# Patient Record
Sex: Male | Born: 1935 | Race: White | Hispanic: No | Smoking: Former smoker
Health system: Southern US, Community
[De-identification: ages and names within clinical notes are randomized; demographics above are authoritative.]

## PROBLEM LIST (undated history)

## (undated) DIAGNOSIS — N281 Cyst of kidney, acquired: Secondary | ICD-10-CM

## (undated) DIAGNOSIS — K449 Diaphragmatic hernia without obstruction or gangrene: Secondary | ICD-10-CM

## (undated) DIAGNOSIS — F419 Anxiety disorder, unspecified: Secondary | ICD-10-CM

## (undated) DIAGNOSIS — L570 Actinic keratosis: Secondary | ICD-10-CM

## (undated) DIAGNOSIS — N189 Chronic kidney disease, unspecified: Secondary | ICD-10-CM

## (undated) DIAGNOSIS — Z7901 Long term (current) use of anticoagulants: Secondary | ICD-10-CM

## (undated) DIAGNOSIS — N400 Enlarged prostate without lower urinary tract symptoms: Secondary | ICD-10-CM

## (undated) DIAGNOSIS — K648 Other hemorrhoids: Secondary | ICD-10-CM

## (undated) DIAGNOSIS — E538 Deficiency of other specified B group vitamins: Secondary | ICD-10-CM

## (undated) DIAGNOSIS — I441 Atrioventricular block, second degree: Secondary | ICD-10-CM

## (undated) DIAGNOSIS — J449 Chronic obstructive pulmonary disease, unspecified: Secondary | ICD-10-CM

## (undated) DIAGNOSIS — R001 Bradycardia, unspecified: Secondary | ICD-10-CM

## (undated) DIAGNOSIS — K219 Gastro-esophageal reflux disease without esophagitis: Secondary | ICD-10-CM

## (undated) DIAGNOSIS — I700 Atherosclerosis of aorta: Secondary | ICD-10-CM

## (undated) DIAGNOSIS — I2510 Atherosclerotic heart disease of native coronary artery without angina pectoris: Secondary | ICD-10-CM

## (undated) DIAGNOSIS — R350 Frequency of micturition: Secondary | ICD-10-CM

## (undated) DIAGNOSIS — Z87442 Personal history of urinary calculi: Secondary | ICD-10-CM

## (undated) DIAGNOSIS — I10 Essential (primary) hypertension: Secondary | ICD-10-CM

## (undated) DIAGNOSIS — M8580 Other specified disorders of bone density and structure, unspecified site: Secondary | ICD-10-CM

## (undated) DIAGNOSIS — M1990 Unspecified osteoarthritis, unspecified site: Secondary | ICD-10-CM

## (undated) DIAGNOSIS — G4733 Obstructive sleep apnea (adult) (pediatric): Secondary | ICD-10-CM

## (undated) DIAGNOSIS — E785 Hyperlipidemia, unspecified: Secondary | ICD-10-CM

## (undated) DIAGNOSIS — N200 Calculus of kidney: Secondary | ICD-10-CM

## (undated) DIAGNOSIS — C801 Malignant (primary) neoplasm, unspecified: Secondary | ICD-10-CM

## (undated) HISTORY — DX: Essential (primary) hypertension: I10

## (undated) HISTORY — PX: TOTAL SHOULDER REPLACEMENT: SUR1217

## (undated) HISTORY — PX: CARDIAC CATHETERIZATION: SHX172

## (undated) HISTORY — PX: SKIN CANCER EXCISION: SHX779

## (undated) HISTORY — DX: Actinic keratosis: L57.0

## (undated) HISTORY — PX: CORONARY ARTERY BYPASS GRAFT: SHX141

## (undated) HISTORY — DX: Calculus of kidney: N20.0

## (undated) HISTORY — DX: Unspecified osteoarthritis, unspecified site: M19.90

## (undated) HISTORY — PX: FRACTURE SURGERY: SHX138

## (undated) HISTORY — PX: PILONIDAL CYST EXCISION: SHX744

## (undated) HISTORY — PX: COLONOSCOPY: SHX174

---

## 1999-09-22 DIAGNOSIS — Z951 Presence of aortocoronary bypass graft: Secondary | ICD-10-CM

## 1999-09-22 HISTORY — DX: Presence of aortocoronary bypass graft: Z95.1

## 2007-11-11 DIAGNOSIS — Z716 Tobacco abuse counseling: Secondary | ICD-10-CM | POA: Insufficient documentation

## 2010-03-08 DIAGNOSIS — R1013 Epigastric pain: Secondary | ICD-10-CM | POA: Insufficient documentation

## 2011-01-22 DIAGNOSIS — Z951 Presence of aortocoronary bypass graft: Secondary | ICD-10-CM | POA: Insufficient documentation

## 2011-08-03 DIAGNOSIS — S42209A Unspecified fracture of upper end of unspecified humerus, initial encounter for closed fracture: Secondary | ICD-10-CM | POA: Insufficient documentation

## 2012-06-15 DIAGNOSIS — J449 Chronic obstructive pulmonary disease, unspecified: Secondary | ICD-10-CM | POA: Insufficient documentation

## 2013-05-23 DIAGNOSIS — Z872 Personal history of diseases of the skin and subcutaneous tissue: Secondary | ICD-10-CM | POA: Insufficient documentation

## 2014-02-22 DIAGNOSIS — L814 Other melanin hyperpigmentation: Secondary | ICD-10-CM | POA: Insufficient documentation

## 2014-02-22 DIAGNOSIS — L570 Actinic keratosis: Secondary | ICD-10-CM | POA: Insufficient documentation

## 2014-02-22 DIAGNOSIS — D1801 Hemangioma of skin and subcutaneous tissue: Secondary | ICD-10-CM | POA: Insufficient documentation

## 2014-06-28 DIAGNOSIS — H02135 Senile ectropion of left lower eyelid: Secondary | ICD-10-CM | POA: Insufficient documentation

## 2015-11-01 DIAGNOSIS — K648 Other hemorrhoids: Secondary | ICD-10-CM | POA: Insufficient documentation

## 2015-11-01 DIAGNOSIS — K5730 Diverticulosis of large intestine without perforation or abscess without bleeding: Secondary | ICD-10-CM | POA: Insufficient documentation

## 2015-12-16 DIAGNOSIS — E663 Overweight: Secondary | ICD-10-CM | POA: Insufficient documentation

## 2016-01-01 DIAGNOSIS — G4733 Obstructive sleep apnea (adult) (pediatric): Secondary | ICD-10-CM | POA: Insufficient documentation

## 2016-12-08 DIAGNOSIS — Z808 Family history of malignant neoplasm of other organs or systems: Secondary | ICD-10-CM | POA: Insufficient documentation

## 2016-12-28 DIAGNOSIS — E559 Vitamin D deficiency, unspecified: Secondary | ICD-10-CM | POA: Insufficient documentation

## 2016-12-30 DIAGNOSIS — E538 Deficiency of other specified B group vitamins: Secondary | ICD-10-CM | POA: Insufficient documentation

## 2017-05-20 ENCOUNTER — Encounter: Payer: Self-pay | Admitting: Urology

## 2017-05-20 ENCOUNTER — Ambulatory Visit (INDEPENDENT_AMBULATORY_CARE_PROVIDER_SITE_OTHER): Payer: Medicare HMO | Admitting: Urology

## 2017-05-20 VITALS — BP 153/97 | HR 76 | Ht 70.0 in | Wt 194.6 lb

## 2017-05-20 DIAGNOSIS — N400 Enlarged prostate without lower urinary tract symptoms: Secondary | ICD-10-CM | POA: Diagnosis not present

## 2017-05-20 DIAGNOSIS — Z125 Encounter for screening for malignant neoplasm of prostate: Secondary | ICD-10-CM

## 2017-05-20 NOTE — Addendum Note (Signed)
Addended by: Kyra Manges on: 05/20/2017 04:41 PM   Modules accepted: Orders

## 2017-05-20 NOTE — Progress Notes (Signed)
05/20/2017 1:40 PM   James [name] [birth year] [id number]  Referring provider: McLean-Scocuzza, Nino Glow, [place], [place] [address]  Chief Complaint  Patient presents with  . Elevated PSA    HPI: The patient is a 81 year old gentleman with a past metal history of BPH on doxazosin and finasteride along with multiple medical comorbidities presents today to discuss his PSA. His PSA was 3.0 in August 2018.  It was 1.3 in February 2017. In regards to his urination, he was just started on doxazosin and finasteride within the last few weeks. He is noted improvement in his urinary symptoms since starting these medications. He still complains of nocturia 3 which is improved from nocturia every hour. He feels like he has normal frequency during the day. He feels he has a good stream and empties his bladder. His main concern is his nocturia currently. Overall he is not very bothered by these symptoms. He is more concerned to ensure that he does not have prostate cancer.   PMH: Past Medical History:  Diagnosis Date  . Arthritis   . Hypertension     Surgical History: Past Surgical History:  Procedure Laterality Date  . SKIN CANCER EXCISION    . TOTAL SHOULDER REPLACEMENT Left     Home Medications:  Allergies as of 05/20/2017   No Known Allergies     Medication List       Accurate as of 05/20/17  1:40 PM. Always use your most recent med list.          atorvastatin 10 MG tablet Commonly known as:  LIPITOR every evening.   carvedilol 3.125 MG tablet Commonly known as:  COREG Take 3.125 mg by mouth 2 (two) times daily.   doxazosin 1 MG tablet Commonly known as:  CARDURA TAKE 1 TABLET BY MOUTH EVERY NIGHT AT BEDTIME FOR PROSTATE SYMPTOMS   finasteride 5 MG tablet Commonly known as:  PROSCAR Take 5 mg by mouth daily.   lisinopril 20 MG tablet Commonly known as:  PRINIVIL,ZESTRIL TAKE 1 TABLET BY MOUTH EVERY DAY IN THE MORNING   Vitamin D  (Ergocalciferol) 50000 units Caps capsule Commonly known as:  [place] DAY            Discharge Care Instructions        Start     Ordered   05/20/17 0000  Urinalysis, Complete     05/20/17 1311      Allergies: No Known Allergies  Family History: Family History  Problem Relation Age of Onset  . Prostate cancer Neg Hx   . Bladder Cancer Neg Hx   . Kidney cancer Neg Hx     Social History:  reports that he has quit smoking. He has never used smokeless tobacco. He reports that he drinks alcohol. He reports that he does not use drugs.  ROS: UROLOGY Frequent Urination?: Yes Hard to postpone urination?: Yes Burning/pain with urination?: No Get up at night to urinate?: Yes Leakage of urine?: No Urine stream starts and stops?: Yes Trouble starting stream?: No Do you have to strain to urinate?: No Blood in urine?: No Urinary tract infection?: No Sexually transmitted disease?: No Injury to kidneys or bladder?: No Painful intercourse?: No Weak stream?: Yes Erection problems?: No Penile pain?: No  Gastrointestinal Nausea?: No Vomiting?: No Indigestion/heartburn?: No Diarrhea?: No Constipation?: No  Constitutional Fever: No Night sweats?: No Weight loss?: No Fatigue?: No  Skin Skin rash/lesions?: No Itching?: No  Eyes Blurred  vision?: No Double vision?: No  Ears/Nose/Throat Sore throat?: No Sinus problems?: No  Hematologic/Lymphatic Swollen glands?: No Easy bruising?: Yes  Cardiovascular Leg swelling?: No Chest pain?: No  Respiratory Cough?: No Shortness of breath?: No  Endocrine Excessive thirst?: No  Musculoskeletal Back pain?: No Joint pain?: No  Neurological Headaches?: No Dizziness?: No  Psychologic Depression?: No Anxiety?: Yes  Physical Exam: BP (!) 153/97 (BP Location: Left Arm, Patient Position: Sitting, Cuff Size: Normal)   Pulse 76   Ht 5\' 10"  (1.778 m)   Wt 194 lb 9.6 oz (88.3 kg)   BMI 27.92  kg/m   Constitutional:  Alert and oriented, No acute distress. HEENT: [place] AT, moist mucus membranes.  Trachea midline, no masses. Cardiovascular: No clubbing, cyanosis, or edema. Respiratory: Normal respiratory effort, no increased work of breathing. GI: Abdomen is soft, nontender, nondistended, no abdominal masses GU: No CVA tenderness. Normal phallus. Testicles and equal bilaterally. 9. DRE: 2+ smooth benign. Skin: No rashes, bruises or suspicious lesions. Lymph: No cervical or inguinal adenopathy. Neurologic: Grossly intact, no focal deficits, moving all 4 extremities. Psychiatric: Normal mood and affect.  Laboratory Data: No results found for: WBC, HGB, HCT, MCV, PLT  No results found for: CREATININE  No results found for: PSA  No results found for: TESTOSTERONE  No results found for: HGBA1C  Urinalysis No results found for: COLORURINE, APPEARANCEUR, LABSPEC, PHURINE, GLUCOSEU, HGBUR, BILIRUBINUR, KETONESUR, PROTEINUR, UROBILINOGEN, NITRITE, LEUKOCYTESUR  Assessment & Plan:    1. Prostate cancer screening I discussed the patient that his PSA is completely appropriate for his age. We also discussed the American Urological Association guidelines would strongly discouraged prostate cancer screening via PSA in men in their late 70s. As such since he has age-appropriate PSA, no further PSA screening in this patient is indicated.  2. BPH Continue doxazosin and finasteride. I did discuss the patient that his finasteride will take up to 6 months to have maximal effect at least 6 weeks before any effect is even noted. Since he just underwent this medication, I have advised him to continue for 6 months along with his doxazosin. He is still having bothersome urinary symptoms at that time he'll contact the office for reevaluation.   Return if symptoms worsen or fail to improve.  Nickie Retort, MD  [facility] [address] [place],   [address] [phone]

## 2017-06-22 DIAGNOSIS — I25810 Atherosclerosis of coronary artery bypass graft(s) without angina pectoris: Secondary | ICD-10-CM | POA: Insufficient documentation

## 2018-06-13 DIAGNOSIS — K449 Diaphragmatic hernia without obstruction or gangrene: Secondary | ICD-10-CM | POA: Insufficient documentation

## 2018-06-24 ENCOUNTER — Encounter: Payer: Self-pay | Admitting: Urology

## 2018-06-24 ENCOUNTER — Ambulatory Visit: Payer: Medicare HMO | Admitting: Urology

## 2018-06-24 VITALS — BP 170/101 | HR 69 | Ht 70.0 in | Wt 204.9 lb

## 2018-06-24 DIAGNOSIS — N200 Calculus of kidney: Secondary | ICD-10-CM

## 2018-06-24 DIAGNOSIS — N281 Cyst of kidney, acquired: Secondary | ICD-10-CM | POA: Diagnosis not present

## 2018-06-24 NOTE — Progress Notes (Signed)
   06/24/2018 4:05 PM   [name] 1936/03/20 [id number]  Reason for visit: Renal cyst, nephrolithiasis  HPI: I had the pleasure of meeting Mr. Tech in urology clinic today.  He was previously a patient of Dr. Pilar Jarvis followed for mild urinary symptoms.  He recently had a CT performed for indigestion, which showed bilateral renal cysts, as well as bilateral nonobstructing renal stones.  He denies any flank pain or gross hematuria.  He has never passed a kidney stone before.   ROS: Please see flowsheet from today's date for complete review of systems.  Physical Exam: BP (!) 170/101 (BP Location: Left Arm, Patient Position: Sitting, Cuff Size: Normal)   Pulse 69   Ht 5\' 10"  (1.778 m)   Wt 204 lb 14.4 oz (92.9 kg)   BMI 29.40 kg/m    Constitutional:  Alert and oriented, No acute distress. Respiratory: Normal respiratory effort, no increased work of breathing. GI: Abdomen is soft, nontender, nondistended, no abdominal masses GU: No CVA tenderness Skin: No rashes, bruises or suspicious lesions. Neurologic: Grossly intact, no focal deficits, moving all 4 extremities. Psychiatric: Normal mood and affect  Pertinent Imaging:  I have personally reviewed the CT abdomen pelvis dated 06/09/2018: There are bilateral renal cysts, including parapelvic cyst.  Bilateral nonobstructing renal stones.  No hydronephrosis.  Assessment & Plan:   In summary, Mr. Blackburn is an asymptomatic 82 year old male incidentally found to have bilateral renal cysts as well as bilateral nonobstructing renal stones.  He has never had a stone event previously.  We discussed options for management including surveillance, shockwave lithotripsy, and ureteroscopy.  We both agreed that as he is asymptomatic and is never passed a stone before, we will continue to monitor the stones.  No further follow-up is required for his renal cysts.  Return in about 6 months (around 12/24/2018) for kub.  Billey Co,  [facility] [address], [place], [place] [address] [phone]

## 2018-10-18 ENCOUNTER — Ambulatory Visit: Payer: Self-pay | Admitting: General Surgery

## 2018-11-11 NOTE — Pre-Procedure Instructions (Signed)
James [name]  11/11/2018      [id number] [name], [place] [address] Phone: [phone] Fax: [phone]    Your procedure is scheduled on Monday March 2nd.  Report to [facility] Admitting at 12:00PM.  Call this number if you have problems the morning of surgery:  [phone]   Remember:  Do not eat after midnight. You may drink clear liquids until 11:15 AM. Clear liquids include water, non-citrus juices without pulp,  carbonated beverages, clear tea, black coffee and gatorade.     Take these medicines the morning of surgery with A SIP OF WATER  atorvastatin (LIPITOR)  carvedilol (COREG)  citalopram (CELEXA)  finasteride (PROSCAR)  ondansetron (ZOFRAN) if needed pantoprazole (PROTONIX) if needed  Follow your surgeon's instructions on when to stop Asprin.  If no instructions were given by your surgeon then you will need to call the office to get those instructions.    7 days prior to surgery STOP taking any Aspirin(unless otherwise instructed by your surgeon), Aleve, Naproxen, Ibuprofen, Motrin, Advil, Goody's, BC's, all herbal medications, fish oil, and all vitamins    Do not wear jewelry  Do not wear lotions, powders, or colognes, or deodorant.  Do not shave 48 hours prior to surgery.  Men may shave face and neck.  Do not bring valuables to the hospital.  [facility] is not responsible for any belongings or valuables.  Contacts, dentures or bridgework may not be worn into surgery.  Leave your suitcase in the car.  After surgery it may be brought to your room.  For patients admitted to the hospital, discharge time will be determined by your treatment team.  Patients discharged the day of surgery will not be allowed to drive home.   [facility]- Preparing For Surgery  Before surgery, you can play an important role. Because skin is not sterile, your skin needs to be as free of germs as possible. You  can reduce the number of germs on your skin by washing with CHG (chlorahexidine gluconate) Soap before surgery.  CHG is an antiseptic cleaner which kills germs and bonds with the skin to continue killing germs even after washing.    Oral Hygiene is also important to reduce your risk of infection.  Remember - BRUSH YOUR TEETH THE MORNING OF SURGERY WITH YOUR REGULAR TOOTHPASTE  Please do not use if you have an allergy to CHG or antibacterial soaps. If your skin becomes reddened/irritated stop using the CHG.  Do not shave (including legs and underarms) for at least 48 hours prior to first CHG shower. It is OK to shave your face.  Please follow these instructions carefully.   1. Shower the NIGHT BEFORE SURGERY and the MORNING OF SURGERY with CHG.   2. If you chose to wash your hair, wash your hair first as usual with your normal shampoo.  3. After you shampoo, rinse your hair and body thoroughly to remove the shampoo.  4. Use CHG as you would any other liquid soap. You can apply CHG directly to the skin and wash gently with a scrungie or a clean washcloth.   5. Apply the CHG Soap to your body ONLY FROM THE NECK DOWN.  Do not use on open wounds or open sores. Avoid contact with your eyes, ears, mouth and genitals (private parts). Wash Face and genitals (private parts)  with your normal soap.  6. Wash thoroughly, paying special attention  to the area where your surgery will be performed.  7. Thoroughly rinse your body with warm water from the neck down.  8. DO NOT shower/wash with your normal soap after using and rinsing off the CHG Soap.  9. Pat yourself dry with a CLEAN TOWEL.  10. Wear CLEAN PAJAMAS to bed the night before surgery, wear comfortable clothes the morning of surgery  11. Place CLEAN SHEETS on your bed the night of your first shower and DO NOT SLEEP WITH PETS.    Day of Surgery: Shower as stated above. Do not apply any deodorants/lotions.  Please wear clean clothes to the  hospital/surgery center.   Remember to brush your teeth WITH YOUR REGULAR TOOTHPASTE.   Please read over the following fact sheets that you were given.

## 2018-11-14 ENCOUNTER — Other Ambulatory Visit: Payer: Self-pay

## 2018-11-14 ENCOUNTER — Encounter (HOSPITAL_COMMUNITY)
Admission: RE | Admit: 2018-11-14 | Discharge: 2018-11-14 | Disposition: A | Discharge status: Home / Self Care | Payer: Medicare HMO | Source: Ambulatory Visit | Attending: General Surgery | Admitting: General Surgery

## 2018-11-14 ENCOUNTER — Encounter (HOSPITAL_COMMUNITY): Payer: Self-pay

## 2018-11-14 DIAGNOSIS — Z01818 Encounter for other preprocedural examination: Secondary | ICD-10-CM | POA: Diagnosis present

## 2018-11-14 DIAGNOSIS — R001 Bradycardia, unspecified: Secondary | ICD-10-CM | POA: Insufficient documentation

## 2018-11-14 HISTORY — DX: Gastro-esophageal reflux disease without esophagitis: K21.9

## 2018-11-14 HISTORY — DX: Chronic kidney disease, unspecified: N18.9

## 2018-11-14 HISTORY — DX: Malignant (primary) neoplasm, unspecified: C80.1

## 2018-11-14 HISTORY — DX: Frequency of micturition: R35.0

## 2018-11-14 HISTORY — DX: Chronic obstructive pulmonary disease, unspecified: J44.9

## 2018-11-14 HISTORY — DX: Personal history of urinary calculi: Z87.442

## 2018-11-14 HISTORY — DX: Atherosclerotic heart disease of native coronary artery without angina pectoris: I25.10

## 2018-11-14 LAB — BASIC METABOLIC PANEL
ANION GAP: 8 (ref 5–15)
BUN: 17 mg/dL (ref 8–23)
CO2: 27 mmol/L (ref 22–32)
Calcium: 9.6 mg/dL (ref 8.9–10.3)
Chloride: 104 mmol/L (ref 98–111)
Creatinine, Ser: 0.95 mg/dL (ref 0.61–1.24)
GFR calc Af Amer: >60 mL/min (ref >60)
GFR calc non Af Amer: >60 mL/min (ref >60)
Glucose, Bld: 101 mg/dL — ABNORMAL HIGH (ref 70–99)
Potassium: 4.2 mmol/L (ref 3.5–5.1)
Sodium: 139 mmol/L (ref 135–145)

## 2018-11-14 LAB — CBC
HCT: 41.6 % (ref 39.0–52.0)
Hemoglobin: 13.6 g/dL (ref 13.0–17.0)
MCH: 31 pg (ref 26.0–34.0)
MCHC: 32.7 g/dL (ref 30.0–36.0)
MCV: 94.8 fL (ref 80.0–100.0)
Platelets: 157 10*3/uL (ref 150–400)
RBC: 4.39 MIL/uL (ref 4.22–5.81)
RDW: 12.6 % (ref 11.5–15.5)
WBC: 5.2 10*3/uL (ref 4.0–10.5)
nRBC: 0 % (ref 0.0–0.2)

## 2018-11-14 NOTE — Progress Notes (Addendum)
PCP - Lamonte Sakai MD Cardiologist - Dr. Neoma Laming  [place], [place]  Chest x-ray -  EKG - 11-14-2018 Stress Test - requested from Dr. Humphrey Rolls ECHO            Requested from Dr. Humphrey Rolls    -   Sleep Study -n/a  CPAP -   Fasting Blood Sugar - n/a Checks Blood Sugar _____ times a day  Blood Thinner Instructions:n/a Aspirin Instructions:already stopped  Anesthesia review:yes  Cardiac history   Patient denies shortness of breath, fever, cough and chest pain at PAT appointment   Patient verbalized understanding of instructions that were given to them at the PAT appointment. Patient was also instructed that they will need to review over the PAT instructions again at home before surgery.

## 2018-11-15 NOTE — Progress Notes (Addendum)
Anesthesia Chart Review:  Case:  [id number] Date/Time:  11/21/18 1400   Procedures:      HERNIA REPAIR UMBILICAL ADULT WITH MESH (N/A )     EXCISIO BACK LIPOMA (N/A )   Anesthesia type:  General   Pre-op diagnosis:  umbilical hernia, lipoma back   Location:  [facility] OR ROOM 02 / [place] OR   Surgeon:  James Kussmaul, MD      DISCUSSION: Patient is an 83 year old [name] scheduled for the above procedure.  History includes former smoker (quit 2015), CAD (CABG x1 05/2000 in [place]), HTN, COPD, GERD, CKD, skin cancer (BCC, s/p excision).   He denied SOB, fever, cough, and chest pain at PAT RN visit. His last cardiac testing was within the past two years (06/2017). Stress test was read as equivocal, although no additional cardiac testing recommended in post-stress follow-up. Per [facility] staff, last cardiology records are from 07/01/17. His PCP Dr. Lamonte [name] referred patient for lipoma resection. Discussed with anesthesiologist Suzette Battiest, MD. Recommend preoperative cardiology input. [facility] triage nurse Abigail Butts notified and advised to follow-up with Dr. Neoma [name] with [facility] in [place] regarding surgical clearance.   ADDENDUM 11/17/18 1:28 PM: Patient was seen at [facility] on 11/16/18 by Jake Bathe, NP-C. EKG stable and patient able to tolerate daily exercises without CV symptoms, so he was "Cleared from cardiac perspective for hernia repair surgery."   VS: BP (!) 148/72   Pulse 94   Temp (!) 36.2 C   Resp 20   Ht 5\' 10"  (1.778 m)   Wt 91 kg   SpO2 98%   BMI 28.80 kg/m    PROVIDERS: Perrin Maltese, MD is PCP ([facility]). 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 James Laming, MD is cardiologist ([facility]) - James Madrid, MD is urologist   LABS: Labs reviewed: Acceptable for surgery. (all labs ordered are listed, but only abnormal results are displayed)  Labs Reviewed   BASIC METABOLIC PANEL - Abnormal; Notable for the following components:      Result Value   Glucose, Bld 101 (*)    All other components within normal limits  CBC    EKG:  - EKG 11/16/18 ([place]): SR with PACs. - EKG 11/14/18: Sinus bradycardia with marked sinus arrhythmia (frequent PACs). Artifact in V1. Currently no comparison EKG, although last tracing requested from [place].    CV: Nuclear Stress Test 06/29/17 ([place]): EKG results: Sinus tachycardia at 101 bpm.  No ST changes at peak exercise. Perfusion/wall motion findings: EF 62%.  Small mild fixed inferior wall defect possibly due to diaphragmatic attenuation artifact.  Normal wall motion. Impression: Equivocal stress test with normal LVEF. (Continued medical therapy outlined at 07/01/17 stress test follow-up visit with Dr. Laurelyn Sickle.)  Echo 06/22/17 ([place]): Summary: Suboptimal study due to poor windows (barrel chested). Severely dilated left atrium, mildly dilated aortic root 3.8 cm), and mildly dilated ascending aorta with ventricles appear normal in size. Normal LV systolic function. EF visually 50-55%. Mild septal hypokinesis. Mild left ventricular hypertrophy with grade 1 (relaxation abnormality) diastolic dysfunction. Mild pulmonic regurgitation. Mild tricuspid regurgitation. Normal pulmonary artery pressure. Mild to moderate mitral regurgitation. Mild aortic regurgitation. No pericardial effusion.   Past Medical History:  Diagnosis Date  . Arthritis   . Cancer ([place])    basal cell carcinoma  . Chronic kidney disease   . COPD (chronic obstructive pulmonary disease) ([place])   .  Coronary artery disease    CABG   2001   [place]  . Frequency of urination   . GERD (gastroesophageal reflux disease)   . History of kidney stones   . Hypertension     Past Surgical History:  Procedure Laterality Date  . CARDIAC CATHETERIZATION    . CORONARY ARTERY BYPASS GRAFT    . FRACTURE SURGERY  Left   . PILONIDAL CYST EXCISION    . SKIN CANCER EXCISION    . TOTAL SHOULDER REPLACEMENT Left     MEDICATIONS: . aspirin EC 325 MG tablet  . atorvastatin (LIPITOR) 20 MG tablet  . BREWERS YEAST PO  . carvedilol (COREG) 12.5 MG tablet  . Cholecalciferol (VITAMIN D) 50 MCG (2000 UT) tablet  . citalopram (CELEXA) 20 MG tablet  . Coenzyme Q10 (COQ-10) 100 MG CAPS  . doxazosin (CARDURA) 1 MG tablet  . finasteride (PROSCAR) 5 MG tablet  . lisinopril-hydrochlorothiazide (PRINZIDE,ZESTORETIC) 20-12.5 MG tablet  . Naproxen Sod-diphenhydrAMINE (ALEVE PM) 220-25 MG TABS  . ondansetron (ZOFRAN) 4 MG tablet  . pantoprazole (PROTONIX) 40 MG tablet   No current facility-administered medications for this encounter.   ASA on hold for surgery.   James Gianotti, PA-C Surgical Short Stay/Anesthesiology [facility] Phone [phone] [facility] Phone [phone] 11/15/2018 4:20 PM

## 2018-11-17 NOTE — Anesthesia Preprocedure Evaluation (Addendum)
Anesthesia Evaluation  Patient identified by MRN, date of birth, ID band Patient awake    Reviewed: Allergy & Precautions, NPO status , Patient's Chart, lab work & pertinent test results, reviewed documented beta blocker date and time   History of Anesthesia Complications Negative for: history of anesthetic complications  Airway Mallampati: II  TM Distance: >3 FB Neck ROM: Full    Dental  (+) Dental Advisory Given, Upper Dentures, Lower Dentures   Pulmonary COPD, former smoker,    Pulmonary exam normal breath sounds clear to auscultation       Cardiovascular hypertension, Pt. on medications and Pt. on home beta blockers + CAD and + CABG (2001)  Normal cardiovascular exam Rhythm:Regular Rate:Normal  Nuclear Stress Test 06/29/17 ([place]): EKG results: Sinus tachycardia at 101 bpm.  No ST changes at peak exercise. Perfusion/wall motion findings: EF 62%.  Small mild fixed inferior wall defect possibly due to diaphragmatic attenuation artifact.  Normal wall motion. Impression: Equivocal stress test with normal LVEF.   Neuro/Psych negative neurological ROS     GI/Hepatic Neg liver ROS, GERD  ,  Endo/Other  negative endocrine ROS  Renal/GU Renal InsufficiencyRenal disease     Musculoskeletal  (+) Arthritis ,   Abdominal   Peds  Hematology negative hematology ROS (+)   Anesthesia Other Findings Day of surgery medications reviewed with the patient.  Reproductive/Obstetrics                           Anesthesia Physical Anesthesia Plan  ASA: III  Anesthesia Plan: General   Post-op Pain Management:    Induction: Intravenous  PONV Risk Score and Plan: 2 and Treatment may vary due to age or medical condition, Ondansetron and Dexamethasone  Airway Management Planned: Oral ETT  Additional Equipment: None  Intra-op Plan:   Post-operative Plan: Extubation in OR  Informed Consent: I  have reviewed the patients History and Physical, chart, labs and discussed the procedure including the risks, benefits and alternatives for the proposed anesthesia with the patient or authorized representative who has indicated his/her understanding and acceptance.     Dental advisory given  Plan Discussed with: CRNA  Anesthesia Plan Comments: (PAT note written by Myra Gianotti, PA-C. )      Anesthesia Quick Evaluation

## 2018-11-21 ENCOUNTER — Ambulatory Visit (HOSPITAL_COMMUNITY): Payer: Medicare HMO | Admitting: Vascular Surgery

## 2018-11-21 ENCOUNTER — Ambulatory Visit (HOSPITAL_COMMUNITY)
Admission: RE | Admit: 2018-11-21 | Discharge: 2018-11-21 | Disposition: A | Discharge status: Home / Self Care | Payer: Medicare HMO | Attending: General Surgery | Admitting: General Surgery

## 2018-11-21 ENCOUNTER — Ambulatory Visit (HOSPITAL_COMMUNITY): Payer: Medicare HMO | Admitting: Registered Nurse

## 2018-11-21 ENCOUNTER — Other Ambulatory Visit: Payer: Self-pay

## 2018-11-21 ENCOUNTER — Encounter (HOSPITAL_COMMUNITY)
Admission: RE | Disposition: A | Discharge status: Home / Self Care | Payer: Self-pay | Source: Home / Self Care | Attending: General Surgery

## 2018-11-21 ENCOUNTER — Encounter (HOSPITAL_COMMUNITY): Payer: Self-pay | Admitting: *Deleted

## 2018-11-21 DIAGNOSIS — J449 Chronic obstructive pulmonary disease, unspecified: Secondary | ICD-10-CM | POA: Insufficient documentation

## 2018-11-21 DIAGNOSIS — F419 Anxiety disorder, unspecified: Secondary | ICD-10-CM | POA: Diagnosis not present

## 2018-11-21 DIAGNOSIS — K429 Umbilical hernia without obstruction or gangrene: Secondary | ICD-10-CM | POA: Insufficient documentation

## 2018-11-21 DIAGNOSIS — K219 Gastro-esophageal reflux disease without esophagitis: Secondary | ICD-10-CM | POA: Insufficient documentation

## 2018-11-21 DIAGNOSIS — I10 Essential (primary) hypertension: Secondary | ICD-10-CM | POA: Diagnosis not present

## 2018-11-21 DIAGNOSIS — I2510 Atherosclerotic heart disease of native coronary artery without angina pectoris: Secondary | ICD-10-CM | POA: Diagnosis not present

## 2018-11-21 DIAGNOSIS — Z951 Presence of aortocoronary bypass graft: Secondary | ICD-10-CM | POA: Diagnosis not present

## 2018-11-21 DIAGNOSIS — Z79899 Other long term (current) drug therapy: Secondary | ICD-10-CM | POA: Diagnosis not present

## 2018-11-21 DIAGNOSIS — Z87891 Personal history of nicotine dependence: Secondary | ICD-10-CM | POA: Insufficient documentation

## 2018-11-21 DIAGNOSIS — Z85820 Personal history of malignant melanoma of skin: Secondary | ICD-10-CM | POA: Diagnosis not present

## 2018-11-21 DIAGNOSIS — D171 Benign lipomatous neoplasm of skin and subcutaneous tissue of trunk: Secondary | ICD-10-CM | POA: Insufficient documentation

## 2018-11-21 HISTORY — PX: UMBILICAL HERNIA REPAIR: SHX196

## 2018-11-21 HISTORY — PX: LIPOMA EXCISION: SHX5283

## 2018-11-21 SURGERY — REPAIR, HERNIA, UMBILICAL, ADULT
Anesthesia: General | Site: Back

## 2018-11-21 MED ORDER — CELECOXIB 200 MG PO CAPS: 200.0000 mg | ORAL_CAPSULE | ORAL | Status: DC

## 2018-11-21 MED ORDER — HYDROCODONE-ACETAMINOPHEN 5-325 MG PO TABS: 1.0000 | ORAL_TABLET | Freq: Four times a day (QID) | ORAL | 0 refills | Status: DC | PRN

## 2018-11-21 MED ORDER — CELECOXIB 200 MG PO CAPS
ORAL_CAPSULE | ORAL | Status: AC
  Administered 2018-11-21: 200 mg
  Filled 2018-11-21: qty 1

## 2018-11-21 MED ORDER — DEXAMETHASONE SODIUM PHOSPHATE 10 MG/ML IJ SOLN
INTRAMUSCULAR | Status: AC
  Filled 2018-11-21: qty 1

## 2018-11-21 MED ORDER — SUGAMMADEX SODIUM 200 MG/2ML IV SOLN
INTRAVENOUS | Status: DC | PRN
  Administered 2018-11-21: 180.6 mg via INTRAVENOUS

## 2018-11-21 MED ORDER — FENTANYL CITRATE (PF) 100 MCG/2ML IJ SOLN
INTRAMUSCULAR | Status: DC | PRN
  Administered 2018-11-21: 100 ug via INTRAVENOUS
  Administered 2018-11-21 (×2): 25 ug via INTRAVENOUS

## 2018-11-21 MED ORDER — CHLORHEXIDINE GLUCONATE CLOTH 2 % EX PADS: 6.0000 | MEDICATED_PAD | Freq: Once | CUTANEOUS | Status: DC

## 2018-11-21 MED ORDER — ACETAMINOPHEN 500 MG PO TABS
ORAL_TABLET | ORAL | Status: AC
  Administered 2018-11-21: 1000 mg
  Filled 2018-11-21: qty 2

## 2018-11-21 MED ORDER — GABAPENTIN 300 MG PO CAPS
ORAL_CAPSULE | ORAL | Status: AC
  Administered 2018-11-21: 300 mg
  Filled 2018-11-21: qty 1

## 2018-11-21 MED ORDER — ARTIFICIAL TEARS OPHTHALMIC OINT
TOPICAL_OINTMENT | OPHTHALMIC | Status: AC
  Filled 2018-11-21: qty 3.5

## 2018-11-21 MED ORDER — LIDOCAINE 2% (20 MG/ML) 5 ML SYRINGE
INTRAMUSCULAR | Status: DC | PRN
  Administered 2018-11-21: 100 mg via INTRAVENOUS

## 2018-11-21 MED ORDER — PROMETHAZINE HCL 25 MG/ML IJ SOLN: 6.2500 mg | INTRAMUSCULAR | Status: DC | PRN

## 2018-11-21 MED ORDER — LACTATED RINGERS IV SOLN
INTRAVENOUS | Status: DC
  Administered 2018-11-21: 13:00:00 via INTRAVENOUS

## 2018-11-21 MED ORDER — BUPIVACAINE HCL 0.25 % IJ SOLN
INTRAMUSCULAR | Status: DC | PRN
  Administered 2018-11-21: 9 mL
  Administered 2018-11-21: 10 mL

## 2018-11-21 MED ORDER — ONDANSETRON HCL 4 MG/2ML IJ SOLN
INTRAMUSCULAR | Status: AC
  Filled 2018-11-21: qty 2

## 2018-11-21 MED ORDER — GLYCOPYRROLATE 0.2 MG/ML IJ SOLN
INTRAMUSCULAR | Status: DC | PRN
  Administered 2018-11-21 (×2): 0.1 mg via INTRAVENOUS

## 2018-11-21 MED ORDER — DEXAMETHASONE SODIUM PHOSPHATE 10 MG/ML IJ SOLN
INTRAMUSCULAR | Status: DC | PRN
  Administered 2018-11-21: 5 mg via INTRAVENOUS

## 2018-11-21 MED ORDER — 0.9 % SODIUM CHLORIDE (POUR BTL) OPTIME
TOPICAL | Status: DC | PRN
  Administered 2018-11-21: 1000 mL

## 2018-11-21 MED ORDER — PROPOFOL 10 MG/ML IV BOLUS
INTRAVENOUS | Status: DC | PRN
  Administered 2018-11-21: 110 mg via INTRAVENOUS

## 2018-11-21 MED ORDER — LACTATED RINGERS IV SOLN: INTRAVENOUS | Status: DC

## 2018-11-21 MED ORDER — ACETAMINOPHEN 10 MG/ML IV SOLN: 1000.0000 mg | Freq: Once | INTRAVENOUS | Status: DC | PRN

## 2018-11-21 MED ORDER — FENTANYL CITRATE (PF) 250 MCG/5ML IJ SOLN
INTRAMUSCULAR | Status: AC
  Filled 2018-11-21: qty 5

## 2018-11-21 MED ORDER — EPHEDRINE 5 MG/ML INJ
INTRAVENOUS | Status: AC
  Filled 2018-11-21: qty 10

## 2018-11-21 MED ORDER — CEFAZOLIN SODIUM-DEXTROSE 2-4 GM/100ML-% IV SOLN: 2.0000 g | INTRAVENOUS | Status: DC

## 2018-11-21 MED ORDER — OXYCODONE HCL 5 MG/5ML PO SOLN: 5.0000 mg | Freq: Once | ORAL | Status: DC | PRN

## 2018-11-21 MED ORDER — CEFAZOLIN SODIUM-DEXTROSE 2-3 GM-%(50ML) IV SOLR
INTRAVENOUS | Status: DC | PRN
  Administered 2018-11-21: 2 g via INTRAVENOUS

## 2018-11-21 MED ORDER — FENTANYL CITRATE (PF) 100 MCG/2ML IJ SOLN: 25.0000 ug | INTRAMUSCULAR | Status: DC | PRN

## 2018-11-21 MED ORDER — LIDOCAINE 2% (20 MG/ML) 5 ML SYRINGE
INTRAMUSCULAR | Status: AC
  Filled 2018-11-21: qty 5

## 2018-11-21 MED ORDER — ONDANSETRON HCL 4 MG/2ML IJ SOLN
INTRAMUSCULAR | Status: DC | PRN
  Administered 2018-11-21: 4 mg via INTRAVENOUS

## 2018-11-21 MED ORDER — BUPIVACAINE HCL (PF) 0.25 % IJ SOLN
INTRAMUSCULAR | Status: AC
  Filled 2018-11-21: qty 30

## 2018-11-21 MED ORDER — ROCURONIUM BROMIDE 50 MG/5ML IV SOSY
PREFILLED_SYRINGE | INTRAVENOUS | Status: AC
  Filled 2018-11-21: qty 5

## 2018-11-21 MED ORDER — OXYCODONE HCL 5 MG PO TABS: 5.0000 mg | ORAL_TABLET | Freq: Once | ORAL | Status: DC | PRN

## 2018-11-21 MED ORDER — ACETAMINOPHEN 500 MG PO TABS: 1000.0000 mg | ORAL_TABLET | ORAL | Status: DC

## 2018-11-21 MED ORDER — PHENYLEPHRINE 40 MCG/ML (10ML) SYRINGE FOR IV PUSH (FOR BLOOD PRESSURE SUPPORT)
PREFILLED_SYRINGE | INTRAVENOUS | Status: AC
  Filled 2018-11-21: qty 10

## 2018-11-21 MED ORDER — PROPOFOL 10 MG/ML IV BOLUS
INTRAVENOUS | Status: AC
  Filled 2018-11-21: qty 20

## 2018-11-21 MED ORDER — EPHEDRINE SULFATE 50 MG/ML IJ SOLN
INTRAMUSCULAR | Status: DC | PRN
  Administered 2018-11-21 (×3): 5 mg via INTRAVENOUS

## 2018-11-21 MED ORDER — PHENYLEPHRINE HCL 10 MG/ML IJ SOLN
INTRAMUSCULAR | Status: DC | PRN
  Administered 2018-11-21: 80 ug via INTRAVENOUS
  Administered 2018-11-21: 120 ug via INTRAVENOUS
  Administered 2018-11-21 (×2): 80 ug via INTRAVENOUS

## 2018-11-21 MED ORDER — ROCURONIUM BROMIDE 50 MG/5ML IV SOSY
PREFILLED_SYRINGE | INTRAVENOUS | Status: DC | PRN
  Administered 2018-11-21: 50 mg via INTRAVENOUS
  Administered 2018-11-21: 20 mg via INTRAVENOUS

## 2018-11-21 MED ORDER — ARTIFICIAL TEARS OPHTHALMIC OINT
TOPICAL_OINTMENT | OPHTHALMIC | Status: DC | PRN
  Administered 2018-11-21: 1 via OPHTHALMIC

## 2018-11-21 MED ORDER — GABAPENTIN 300 MG PO CAPS: 300.0000 mg | ORAL_CAPSULE | ORAL | Status: DC

## 2018-11-21 SURGICAL SUPPLY — 34 items
BLADE CLIPPER SURG (BLADE) ×4 IMPLANT
CANISTER SUCT 3000ML PPV (MISCELLANEOUS) IMPLANT
CHLORAPREP W/TINT 26ML (MISCELLANEOUS) ×8 IMPLANT
COVER SURGICAL LIGHT HANDLE (MISCELLANEOUS) ×8 IMPLANT
COVER WAND RF STERILE (DRAPES) ×4 IMPLANT
DECANTER SPIKE VIAL GLASS SM (MISCELLANEOUS) ×4 IMPLANT
DERMABOND ADVANCED (GAUZE/BANDAGES/DRESSINGS) ×4
DERMABOND ADVANCED .7 DNX12 (GAUZE/BANDAGES/DRESSINGS) ×4 IMPLANT
DRAPE LAPAROSCOPIC ABDOMINAL (DRAPES) ×4 IMPLANT
DRAPE LAPAROTOMY 100X72 PEDS (DRAPES) ×4 IMPLANT
DRAPE UTILITY 15X26 W/TAPE STR (DRAPE) ×4 IMPLANT
ELECT REM PT RETURN 9FT ADLT (ELECTROSURGICAL) ×4
ELECTRODE REM PT RTRN 9FT ADLT (ELECTROSURGICAL) ×2 IMPLANT
GAUZE SPONGE 4X4 12PLY STRL (GAUZE/BANDAGES/DRESSINGS) IMPLANT
GLOVE BIO SURGEON STRL SZ7.5 (GLOVE) ×8 IMPLANT
GOWN STRL REUS W/ TWL LRG LVL3 (GOWN DISPOSABLE) ×8 IMPLANT
GOWN STRL REUS W/TWL LRG LVL3 (GOWN DISPOSABLE) ×8
KIT BASIN OR (CUSTOM PROCEDURE TRAY) ×4 IMPLANT
KIT TURNOVER KIT B (KITS) ×4 IMPLANT
NEEDLE HYPO 25GX1X1/2 BEV (NEEDLE) ×4 IMPLANT
NS IRRIG 1000ML POUR BTL (IV SOLUTION) ×4 IMPLANT
PACK GENERAL/GYN (CUSTOM PROCEDURE TRAY) ×4 IMPLANT
PAD ARMBOARD 7.5X6 YLW CONV (MISCELLANEOUS) ×12 IMPLANT
PENCIL SMOKE EVACUATOR (MISCELLANEOUS) ×4 IMPLANT
SPECIMEN JAR SMALL (MISCELLANEOUS) ×4 IMPLANT
SUT MNCRL AB 4-0 PS2 18 (SUTURE) ×8 IMPLANT
SUT NOVA NAB DX-16 0-1 5-0 T12 (SUTURE) ×4 IMPLANT
SUT VIC AB 2-0 SH 27 (SUTURE) ×4
SUT VIC AB 2-0 SH 27X BRD (SUTURE) ×4 IMPLANT
SUT VIC AB 3-0 SH 27 (SUTURE) ×4
SUT VIC AB 3-0 SH 27XBRD (SUTURE) ×4 IMPLANT
SYR CONTROL 10ML LL (SYRINGE) ×4 IMPLANT
TOWEL OR 17X24 6PK STRL BLUE (TOWEL DISPOSABLE) ×4 IMPLANT
TOWEL OR 17X26 10 PK STRL BLUE (TOWEL DISPOSABLE) ×4 IMPLANT

## 2018-11-21 NOTE — Op Note (Addendum)
11/21/2018  3:45 PM  PATIENT:  James [name]  83 y.o. male  PRE-OPERATIVE DIAGNOSIS:  umbilical hernia, lipoma back  POST-OPERATIVE DIAGNOSIS:  umbilical hernia, lipoma back  PROCEDURE:  Procedure(s): HERNIA REPAIR UMBILICAL PRIMARY EXCISION BACK LIPOMA   SURGEON:  Surgeon(s) and Role:    * Jovita Kussmaul, MD - Primary  PHYSICIAN ASSISTANT:   ASSISTANTS: none   ANESTHESIA:   local and general  EBL:  85 mL   BLOOD ADMINISTERED:none  DRAINS: none   LOCAL MEDICATIONS USED:  MARCAINE     SPECIMEN:  Source of Specimen:  lipoma back 8X6cm  DISPOSITION OF SPECIMEN:  PATHOLOGY  COUNTS:  YES  TOURNIQUET:  * No tourniquets in log *  DICTATION: .Dragon Dictation   After informed consent was obtained the patient was brought to the operating room and placed in the supine position on the stretcher.  After adequate induction of general anesthesia the patient was moved into a prone position on the operating table and all pressure points were padded.  The right low back was prepped with ChloraPrep, allowed to dry, and draped in usual sterile manner.  An appropriate timeout was performed.  There was a palpable fatty mass on the right low back.  The area around this was infiltrated with quarter percent Marcaine.  A transversely oriented incision was made overlying the palpable mass.  The incision was carried through the skin and subcutaneous tissue sharply with the electrocautery until the fatty mass was encountered.  The fatty mass was separated from the rest of the subcutaneous tissue by combination of blunt finger dissection and some sharp dissection with electrocautery.  The entire mass seemed to be removed.  The mass measured 8 x 6 cm.  No other palpable abnormal fat was appreciated at the site.  The deep layer of the wound was then closed with interrupted 2-0 Vicryl stitches.  The skin was closed with a running 4-0 Monocryl subcuticular stitch.  Dermabond dressings were applied.  The  patient tolerated the procedure well.  At the end of this portion all needle sponge and instrument counts were correct.  The patient was then moved into a supine position on a new operating table.  The abdomen was then prepped with ChloraPrep, allowed to dry, and draped in usual sterile manner.  An appropriate timeout was performed again.  The area around the umbilicus was then infiltrated with quarter percent Marcaine.  A small transversely oriented incision was made at the inferior edge of the umbilicus with a 15 blade knife.  The incision was carried through the skin and subcutaneous tissue sharply with the electrocautery.  The hernia sac was identified and dissected out by blunt hemostat dissection.  There appeared to be only some preperitoneal fat within the hernia.  This was reduced easily.  The actual fascial defect was very small so that an index finger could not pass into the hernia itself.  Because of the small size we elected to fix the hernia primarily.  The fascial edges were healthy and easy to identify.  The fascial defect was then closed with interrupted #1 Novafil stitches.  The subcutaneous tissue was irrigated with copious amounts of saline.  The base of the appendix was anchored back to the fascia with an interrupted 2-0 Vicryl stitch.  The subcutaneous fascia was then also closed with interrupted 2-0 Vicryl stitches.  The skin was then closed with a running 4-0 Monocryl subcuticular stitch.  Dermabond dressings were applied.  The patient tolerated the  procedure well.  At the end of the case all needle sponge and instrument counts were correct.  The patient was then awakened and taken recovery in stable condition.  PLAN OF CARE: Discharge to home after PACU  PATIENT DISPOSITION:  PACU - hemodynamically stable.   Delay start of Pharmacological VTE agent (>24hrs) due to surgical blood loss or risk of bleeding: not applicable

## 2018-11-21 NOTE — Transfer of Care (Signed)
Immediate Anesthesia Transfer of Care Note  Patient: James [name]  Procedure(s) Performed: HERNIA REPAIR UMBILICAL ADULT WITH MESH (N/A Abdomen) EXCISION BACK LIPOMA (N/A Back)  Patient Location: PACU  Anesthesia Type:General  Level of Consciousness: drowsy and patient cooperative  Airway & Oxygen Therapy: Patient Spontanous Breathing and Patient connected to face mask oxygen  Post-op Assessment: Report given to RN and Post -op Vital signs reviewed and stable  Post vital signs: Reviewed and stable  Last Vitals:  Vitals Value Taken Time  BP 136/71 11/21/2018  3:56 PM  Temp    Pulse 61 11/21/2018  3:59 PM  Resp 12 11/21/2018  3:59 PM  SpO2 95 % 11/21/2018  3:59 PM  Vitals shown include unvalidated device data.  Last Pain:  Vitals:   11/21/18 1247  TempSrc:   PainSc: 0-No pain         Complications: No apparent anesthesia complications

## 2018-11-21 NOTE — Interval H&P Note (Signed)
History and Physical Interval Note:  11/21/2018 1:38 PM  James [name]  has presented today for surgery, with the diagnosis of umbilical hernia, lipoma back  The various methods of treatment have been discussed with the patient and family. After consideration of risks, benefits and other options for treatment, the patient has consented to  Procedure(s): HERNIA REPAIR UMBILICAL ADULT WITH MESH (N/A) EXCISION BACK LIPOMA (N/A) as a surgical intervention .  The patient's history has been reviewed, patient examined, no change in status, stable for surgery.  I have reviewed the patient's chart and labs.  Questions were answered to the patient's satisfaction.     [name]

## 2018-11-21 NOTE — Anesthesia Procedure Notes (Signed)
Procedure Name: Intubation Date/Time: 11/21/2018 2:23 PM Performed by: Kathryne Hitch, CRNA Pre-anesthesia Checklist: Patient identified, Emergency Drugs available, Suction available, Patient being monitored and Timeout performed Patient Re-evaluated:Patient Re-evaluated prior to induction Oxygen Delivery Method: Circle system utilized Preoxygenation: Pre-oxygenation with 100% oxygen Induction Type: IV induction Ventilation: Two handed mask ventilation required and Oral airway inserted - appropriate to patient size Laryngoscope Size: Sabra Heck and 2 Grade View: Grade I Tube size: 7.5 mm Number of attempts: 1 Airway Equipment and Method: Stylet and Oral airway Placement Confirmation: ETT inserted through vocal cords under direct vision,  positive ETCO2 and breath sounds checked- equal and bilateral Secured at: 24 cm Tube secured with: Tape Dental Injury: Teeth and Oropharynx as per pre-operative assessment

## 2018-11-21 NOTE — H&P (Signed)
James [name]  Location: [facility] Patient #: [id number] DOB: [birth year] Married / Language: English / Race: White Male   History of Present Illness  The patient is a 83 year old male who presents with a complaint of Mass. We are asked to see the patient in consultation by Dr. Lamonte Sakai to evaluate him for a lipoma. The patient is a 83 year old white male who presents with a mass on his right low back. He says this has been present for the last couple years. He has some occasional discomfort associated with it. he denies any pain down his leg. he denies any nausea or vomiting. He also reports a bulge at his belly button that has also been present for a couple years.   Problem List/Past Medical  Acid Reflux / GERD  Anxiety  Arthritis  Hemorrhoids  High blood pressure  COPD  Melanoma / Skin Cancer   Past Surgical History  Tonsillectomy  Cataract Extraction-Bilateral   Diagnostic Studies History  Colonoscopy   Allergies  No Known Drug Allergies  Medication History Atorvastatin Calcium (20MG  Tablet, Oral) Active. Carvedilol (12.5MG  Tablet, Oral) Active. Finasteride (5MG  Tablet, Oral) Active. Lisinopril-hydroCHLOROthiazide (20-12.5MG  Tablet, Oral) Active. Pantoprazole Sodium (40MG  Tablet DR, Oral) Active. Citalopram Hydrobromide (20MG  Tablet, Oral) Active. [employer] D3 (2000UNIT Capsule, Oral) Active. Medications Reconciled  Social History  Alcohol use  Occasional alcohol use. Current tobacco use  Former smoker. Caffeine use  Coffee. No drug use   Family History  Arthritis  Mother. Seizure disorder  Son.    Review of Systems  General Not Present- Appetite Loss, Chills, Fatigue, Fever, Night Sweats, Weight Gain and Weight Loss. Note: All other systems negative (unless as noted in HPI & included Review of Systems) Skin Not Present- Change in Wart/Mole, Dryness, Hives, Jaundice, New Lesions, Non-Healing Wounds, Rash and Ulcer. HEENT Not  Present- Earache, Hearing Loss, Hoarseness, Nose Bleed, Oral Ulcers, Ringing in the Ears, Seasonal Allergies, Sinus Pain, Sore Throat, Visual Disturbances, Wears glasses/contact lenses and Yellow Eyes. Respiratory Not Present- Bloody sputum, Chronic Cough, Difficulty Breathing, Snoring and Wheezing. Breast Not Present- Breast Mass, Breast Pain, Nipple Discharge and Skin Changes. Cardiovascular Not Present- Chest Pain, Difficulty Breathing Lying Down, Leg Cramps, Palpitations, Rapid Heart Rate, Shortness of Breath and Swelling of Extremities. Gastrointestinal Not Present- Abdominal Pain, Bloating, Bloody Stool, Change in Bowel Habits, Chronic diarrhea, Constipation, Difficulty Swallowing, Excessive gas, Gets full quickly at meals, Hemorrhoids, Indigestion, Nausea, Rectal Pain and Vomiting. Male Genitourinary Not Present- Blood in Urine, Change in Urinary Stream, Frequency, Impotence, Nocturia, Painful Urination, Urgency and Urine Leakage. Musculoskeletal Not Present- Back Pain, Joint Pain, Joint Stiffness, Muscle Pain, Muscle Weakness and Swelling of Extremities. Neurological Not Present- Decreased Memory, Fainting, Headaches, Numbness, Seizures, Tingling, Tremor, Trouble walking and Weakness. Psychiatric Not Present- Anxiety, Bipolar, Change in Sleep Pattern, Depression, Fearful and Frequent crying. Endocrine Not Present- Cold Intolerance, Excessive Hunger, Hair Changes, Heat Intolerance and New Diabetes. Hematology Not Present- Easy Bruising, Excessive bleeding, Gland problems, HIV and Persistent Infections.  Vitals  Weight: 200.5 lb Height: 70in Body Surface Area: 2.09 m Body Mass Index: 28.77 kg/m        Physical Exam  General Mental Status-Alert. General Appearance-Consistent with stated age. Hydration-Well hydrated. Voice-Normal.  Integumentary Note: there is an 8-10cm fatty feeling mass in the subq tissue of the right low back   Head and  Neck Head-normocephalic, atraumatic with no lesions or palpable masses. Trachea-midline. Thyroid Gland Characteristics - normal size and consistency.  Eye Eyeball -  Bilateral-Extraocular movements intact. Sclera/Conjunctiva - Bilateral-No scleral icterus.  Chest and Lung Exam Chest and lung exam reveals -quiet, even and easy respiratory effort with no use of accessory muscles and on auscultation, normal breath sounds, no adventitious sounds and normal vocal resonance. Inspection Chest Wall - Normal. Back - normal.  Cardiovascular Cardiovascular examination reveals -normal heart sounds, regular rate and rhythm with no murmurs and normal pedal pulses bilaterally.  Abdomen Note: the abdomen is soft and nontender. there is a small reducible umbilical hernia   Neurologic Neurologic evaluation reveals -alert and oriented x 3 with no impairment of recent or remote memory. Mental Status-Normal.  Musculoskeletal Normal Exam - Left-Upper Extremity Strength Normal and Lower Extremity Strength Normal. Normal Exam - Right-Upper Extremity Strength Normal and Lower Extremity Strength Normal.  Lymphatic Head & Neck  General Head & Neck Lymphatics: Bilateral - Description - Normal. Axillary  General Axillary Region: Bilateral - Description - Normal. Tenderness - Non Tender. Femoral & Inguinal  Generalized Femoral & Inguinal Lymphatics: Bilateral - Description - Normal. Tenderness - Non Tender.    Assessment & Plan UMBILICAL HERNIA WITHOUT OBSTRUCTION OR GANGRENE (K42.9) LIPOMA OF BACK (D17.1) Impression: the patient appears to have a large 8 cm lipoma on the right low back. He has some soreness associated with it but it is not clear whether his discomfort is from that or from muscular soreness. He also has an umbilical hernia that reduces and is assymptomatic. I have discussed with him in detail the risks and benefits of each surgery as well as some of the technical  aspects and he understands. He would like to think about it and let me know what he would like to do. If he decides to wait then I will see him back in 6 months. He may call if he wants to schedule surgery. Current Plans Follow up with [place] in the office in 6 MONTHS.   Call us sooner as needed.

## 2018-11-22 ENCOUNTER — Encounter (HOSPITAL_COMMUNITY): Payer: Self-pay | Admitting: General Surgery

## 2018-11-22 NOTE — Anesthesia Postprocedure Evaluation (Signed)
Anesthesia Post Note  Patient: James [name]  Procedure(s) Performed: HERNIA REPAIR UMBILICAL ADULT WITH MESH (N/A Abdomen) EXCISION BACK LIPOMA (N/A Back)     Patient location during evaluation: PACU Anesthesia Type: General Level of consciousness: sedated and patient cooperative Pain management: pain level controlled Vital Signs Assessment: post-procedure vital signs reviewed and stable Respiratory status: spontaneous breathing Cardiovascular status: stable Anesthetic complications: no    Last Vitals:  Vitals:   11/21/18 1630 11/21/18 1640  BP:  (!) 162/77  Pulse: 67 (!) 45  Resp: 14 16  Temp:    SpO2: 94% 93%    Last Pain:  Vitals:   11/21/18 1630  TempSrc:   PainSc: 3    Pain Goal:                   Nolon Nations

## 2018-12-26 ENCOUNTER — Ambulatory Visit: Payer: Medicare HMO | Admitting: Urology

## 2019-03-14 DIAGNOSIS — C4491 Basal cell carcinoma of skin, unspecified: Secondary | ICD-10-CM

## 2019-03-14 HISTORY — DX: Basal cell carcinoma of skin, unspecified: C44.91

## 2019-03-16 DIAGNOSIS — R001 Bradycardia, unspecified: Secondary | ICD-10-CM | POA: Insufficient documentation

## 2019-03-16 DIAGNOSIS — I10 Essential (primary) hypertension: Secondary | ICD-10-CM | POA: Insufficient documentation

## 2019-03-16 DIAGNOSIS — R42 Dizziness and giddiness: Secondary | ICD-10-CM | POA: Insufficient documentation

## 2019-03-22 ENCOUNTER — Ambulatory Visit
Admission: RE | Admit: 2019-03-22 | Discharge: 2019-03-22 | Disposition: A | Discharge status: Home / Self Care | Payer: Medicare HMO | Source: Ambulatory Visit | Attending: Urology | Admitting: Urology

## 2019-03-22 ENCOUNTER — Other Ambulatory Visit: Payer: Self-pay

## 2019-03-22 ENCOUNTER — Ambulatory Visit (INDEPENDENT_AMBULATORY_CARE_PROVIDER_SITE_OTHER): Payer: Medicare HMO | Admitting: Urology

## 2019-03-22 ENCOUNTER — Encounter: Payer: Self-pay | Admitting: Urology

## 2019-03-22 VITALS — BP 107/68 | HR 80 | Ht 71.0 in | Wt 189.0 lb

## 2019-03-22 DIAGNOSIS — N200 Calculus of kidney: Secondary | ICD-10-CM

## 2019-03-22 MED ORDER — FINASTERIDE 5 MG PO TABS: 5.0000 mg | ORAL_TABLET | Freq: Every day | ORAL | 11 refills | Status: DC

## 2019-03-22 MED ORDER — DOXAZOSIN MESYLATE 1 MG PO TABS: 1.0000 mg | ORAL_TABLET | Freq: Every day | ORAL | 11 refills | Status: DC

## 2019-03-22 NOTE — Progress Notes (Signed)
   03/22/2019 2:51 PM   James [name] 1936/02/10 [id number]  Reason for visit: Follow up BPH and nephrolithiasis  HPI: I saw James [name] in urology clinic today in follow-up for BPH and nephrolithiasis.  He was previously followed by Dr. Pilar Jarvis.  He has been on doxazosin and finasteride for BPH with urinary symptoms of weak stream and nocturia.  He feels his urinary symptoms are stable and well-controlled.  He denies any new complaints regarding urination today.  In terms of nephrolithiasis, he has never passed a kidney stone before, however on CT in fall 2019 there were non-obstructing stones bilaterally.  He elected for ongoing surveillance in the setting of no prior symptoms.  There are no aggravating or alleviating factors.  Severity is mild.  He denies any gross hematuria, flank pain, or episodes of urinary retention.   ROS: Please see flowsheet from today's date for complete review of systems.  Physical Exam: BP 107/68 (BP Location: Left Arm, Patient Position: Sitting)   Pulse 80   Ht 5\' 11"  (1.803 m)   Wt 189 lb (85.7 kg)   BMI 26.36 kg/m    Constitutional:  Alert and oriented, No acute distress. Respiratory: Normal respiratory effort, no increased work of breathing. GI: Abdomen is soft, nontender, nondistended, no abdominal masses Skin: No rashes, bruises or suspicious lesions. Neurologic: Grossly intact, no focal deficits, moving all 4 extremities. Psychiatric: Normal mood and affect  Pertinent Imaging:  I have personally reviewed the KUB today.  Bilateral nephrolithiasis appears stable from prior, right sided stone is approximately 7 mm and appears to be in the renal pelvis  Assessment & Plan:   In summary, the patient is a comorbid 83 year old male with BPH well managed on doxazosin and finasteride, as well as stable nephrolithiasis on KUB today.  He remains asymptomatic from nephrolithiasis and would like to continue observation which is certainly very reasonable.   We discussed warning signs including renal colic, fever and flank pain, or inability to urinate.  RTC 1 year for KUB and PVR Doxazosin and finasteride refilled  A total of 15 minutes were spent face-to-face with the patient, greater than 50% was spent in patient education, counseling, and coordination of care regarding BPH and nephrolithiasis.  Billey Co, [facility] [address], [place], [place] [address] [phone]

## 2020-02-14 ENCOUNTER — Ambulatory Visit: Payer: Medicare HMO | Admitting: Dermatology

## 2020-02-14 ENCOUNTER — Other Ambulatory Visit: Payer: Self-pay

## 2020-02-14 ENCOUNTER — Encounter: Payer: Self-pay | Admitting: Dermatology

## 2020-02-14 DIAGNOSIS — D692 Other nonthrombocytopenic purpura: Secondary | ICD-10-CM

## 2020-02-14 DIAGNOSIS — L570 Actinic keratosis: Secondary | ICD-10-CM | POA: Diagnosis not present

## 2020-02-14 DIAGNOSIS — I781 Nevus, non-neoplastic: Secondary | ICD-10-CM | POA: Diagnosis not present

## 2020-02-14 DIAGNOSIS — Z85828 Personal history of other malignant neoplasm of skin: Secondary | ICD-10-CM

## 2020-02-14 DIAGNOSIS — L578 Other skin changes due to chronic exposure to nonionizing radiation: Secondary | ICD-10-CM

## 2020-02-14 DIAGNOSIS — D1801 Hemangioma of skin and subcutaneous tissue: Secondary | ICD-10-CM

## 2020-02-14 DIAGNOSIS — L820 Inflamed seborrheic keratosis: Secondary | ICD-10-CM

## 2020-02-14 DIAGNOSIS — L814 Other melanin hyperpigmentation: Secondary | ICD-10-CM

## 2020-02-14 DIAGNOSIS — Z840 Family history of diseases of the skin and subcutaneous tissue: Secondary | ICD-10-CM

## 2020-02-14 DIAGNOSIS — L821 Other seborrheic keratosis: Secondary | ICD-10-CM

## 2020-02-14 DIAGNOSIS — Z1283 Encounter for screening for malignant neoplasm of skin: Secondary | ICD-10-CM | POA: Diagnosis not present

## 2020-02-14 DIAGNOSIS — D229 Melanocytic nevi, unspecified: Secondary | ICD-10-CM

## 2020-02-14 NOTE — Progress Notes (Signed)
Follow-Up Visit   Subjective  James [name] is a 84 y.o. male who presents for the following: Annual Exam (Hx BCC and fhx of skin CA's - new lesion on the L ear that pt would like checked). Patient presents for total body skin exam for skin cancer screening and mole check.  The following portions of the chart were reviewed this encounter and updated as appropriate:  Tobacco  Allergies  Meds  Problems  Med Hx  Surg Hx  Fam Hx     Review of Systems:  No other skin or systemic complaints except as noted in HPI or Assessment and Plan.  Objective  Well appearing patient in no apparent distress; mood and affect are within normal limits.  A focused examination was performed including extremities, including the arms, hands, fingers, and fingernails and the legs, feet, toes, and toenails. Relevant physical exam findings are noted in the Assessment and Plan.  Objective  Scalp and face x 15 (15): Erythematous thin papules/macules with gritty scale.   Objective  Left Ear x 1, R cheek infraorbital x 1, R wrist x 1 (3): Erythematous keratotic or waxy stuck-on papule or plaque.   Objective  B/L ears: Purple papules    Assessment & Plan  AK (actinic keratosis) (15) Scalp and face x 15  Destruction of lesion - Scalp and face x 15 Complexity: simple   Destruction method: cryotherapy   Informed consent: discussed and consent obtained   Timeout:  patient name, date of birth, surgical site, and procedure verified Lesion destroyed using liquid nitrogen: Yes   Region frozen until ice ball extended beyond lesion: Yes   Outcome: patient tolerated procedure well with no complications   Post-procedure details: wound care instructions given    Inflamed seborrheic keratosis (3) Left Ear x 1, R cheek infraorbital x 1, R wrist x 1  Destruction of lesion - Left Ear x 1, R cheek infraorbital x 1, R wrist x 1 Complexity: simple   Destruction method: cryotherapy   Informed consent:  discussed and consent obtained   Timeout:  patient name, date of birth, surgical site, and procedure verified Lesion destroyed using liquid nitrogen: Yes   Region frozen until ice ball extended beyond lesion: Yes   Outcome: patient tolerated procedure well with no complications   Post-procedure details: wound care instructions given    Skin cancer screening  Telangiectasias B/L ears  [place] - Benign, observe.     Lentigines - Scattered tan macules - Discussed due to sun exposure - Benign, observe - Call for any changes  Seborrheic Keratoses - Stuck-on, waxy, tan-brown papules and plaques  - Discussed benign etiology and prognosis. - Observe - Call for any changes  Melanocytic Nevi - Tan-brown and/or pink-flesh-colored symmetric macules and papules - Benign appearing on exam today - Observation - Call clinic for new or changing moles - Recommend daily use of broad spectrum spf 30+ sunscreen to sun-exposed areas.   Hemangiomas - Red papules - Discussed benign nature - Observe - Call for any changes  Actinic Damage - diffuse scaly erythematous macules with underlying dyspigmentation - Recommend daily broad spectrum sunscreen SPF 30+ to sun-exposed areas, reapply every 2 hours as needed.  - Call for new or changing lesions.  Purpura - Violaceous macules and patches - Benign - Related to age, sun damage and/or use of blood thinners - Observe - Can use OTC arnica containing moisturizer such as Dermend Bruise Formula if desired - Call for worsening or other  concerns  History of Basal Cell Carcinoma of the Skin - No evidence of recurrence today - Recommend regular full body skin exams - Recommend daily broad spectrum sunscreen SPF 30+ to sun-exposed areas, reapply every 2 hours as needed.  - Call if any new or changing lesions are noted between office visits   Skin cancer screening performed today.  Return in about 6 months (around 08/16/2020) for sun  exposed areas and AK's .  Luther Redo, CMA, am acting as scribe for Sarina Ser, MD . Documentation: I have reviewed the above documentation for accuracy and completeness, and I agree with the above.  Sarina Ser, MD

## 2020-03-21 ENCOUNTER — Ambulatory Visit
Admission: RE | Admit: 2020-03-21 | Discharge: 2020-03-21 | Disposition: A | Discharge status: Home / Self Care | Payer: Medicare HMO | Attending: Urology | Admitting: Urology

## 2020-03-21 ENCOUNTER — Other Ambulatory Visit: Payer: Self-pay

## 2020-03-21 ENCOUNTER — Ambulatory Visit
Admission: RE | Admit: 2020-03-21 | Discharge: 2020-03-21 | Disposition: A | Discharge status: Home / Self Care | Payer: Medicare HMO | Source: Ambulatory Visit | Attending: Urology | Admitting: Urology

## 2020-03-21 ENCOUNTER — Encounter: Payer: Self-pay | Admitting: Urology

## 2020-03-21 ENCOUNTER — Ambulatory Visit: Payer: Medicare HMO | Admitting: Urology

## 2020-03-21 VITALS — BP 183/99 | HR 55 | Ht 69.0 in | Wt 181.0 lb

## 2020-03-21 DIAGNOSIS — N200 Calculus of kidney: Secondary | ICD-10-CM

## 2020-03-21 DIAGNOSIS — N138 Other obstructive and reflux uropathy: Secondary | ICD-10-CM

## 2020-03-21 DIAGNOSIS — N401 Enlarged prostate with lower urinary tract symptoms: Secondary | ICD-10-CM | POA: Diagnosis not present

## 2020-03-21 MED ORDER — FINASTERIDE 5 MG PO TABS: 5.0000 mg | ORAL_TABLET | Freq: Every day | ORAL | 3 refills | Status: DC

## 2020-03-21 MED ORDER — DOXAZOSIN MESYLATE 1 MG PO TABS: 1.0000 mg | ORAL_TABLET | Freq: Every day | ORAL | 3 refills | Status: DC

## 2020-03-21 NOTE — Progress Notes (Signed)
   03/21/2020 4:58 PM   James [name] 1935-10-27 [id number]  Reason for visit: Follow up BPH and nephrolithiasis  HPI: I saw James [name] in urology clinic today for follow-up of the above issues.  He is an 84 year old relatively active man who remains on doxazosin and finasteride for BPH with urinary symptoms of weak stream and nocturia.  He feels his urinary symptoms are stable and well controlled and really denies any new complaints today.  He has nocturia 0 times per night which is actually improved from last year.  He denies any stone events in the last year.  He has known bilateral 7 mm renal stones, and is opted for observation for these as he has not been passing stones previously.  On his KUB today, the left renal stone in the lower pole is stable, and I cannot clearly visualize the right renal stone.  This may have passed spontaneously, or be covered by bowel gas today.  He denies any gross hematuria, UTIs, or retention.  He would like to continue with his current treatment strategy for BPH and nephrolithiasis.  We discussed general stone prevention strategies including adequate hydration with goal of producing 2.5 L of urine daily, increasing citric acid intake, increasing calcium intake during high oxalate meals, minimizing animal protein, and decreasing salt intake. Information about dietary recommendations given today.   Continue doxazosin and finasteride, medications refilled RTC 1 year with KUB prior, and PVR  James Co, MD  [facility] [address], [place], [place] [address] [phone]

## 2020-03-21 NOTE — Patient Instructions (Signed)

## 2020-05-28 ENCOUNTER — Ambulatory Visit: Payer: Medicare HMO | Admitting: Podiatry

## 2020-06-06 ENCOUNTER — Other Ambulatory Visit: Payer: Self-pay

## 2020-06-06 ENCOUNTER — Ambulatory Visit: Payer: Medicare HMO | Admitting: Podiatry

## 2020-06-06 ENCOUNTER — Encounter: Payer: Self-pay | Admitting: Podiatry

## 2020-06-06 DIAGNOSIS — L600 Ingrowing nail: Secondary | ICD-10-CM

## 2020-06-06 NOTE — Progress Notes (Signed)
Subjective:  Patient ID: James [name], male    DOB: July 23, 1936,  MRN: [id number]  Chief Complaint  Patient presents with  . Nail Problem    Patient presents today for ingrown toenail left hallux and nail fungus most toes    84 y.o. male presents with the above complaint.  Patient presents with thickened left hallux mycotic toenail with ingrown to the left medial border of the hallux.  Patient states that is painful to touch.  Patient would like to have it removed.  He denies any other acute complaints.  He has not seen anyone else prior to seeing me.  Pain scale 7 out of 10.  Is dull achy in nature.   Review of Systems: Negative except as noted in the HPI. Denies N/V/F/Ch.  Past Medical History:  Diagnosis Date  . Arthritis   . Basal cell carcinoma 03/14/2019   R nasal tip   . Cancer ([place])    basal cell carcinoma  . Chronic kidney disease   . COPD (chronic obstructive pulmonary disease) ([place])   . Coronary artery disease    CABG   2001   CA  . Frequency of urination   . GERD (gastroesophageal reflux disease)   . History of kidney stones   . Hypertension     Current Outpatient Medications:  .  albuterol (VENTOLIN HFA) 108 (90 Base) MCG/ACT inhaler, Albuterol Sulfate HFA 108 (90 Base) MCG/ACT Inhalation Aerosol Solution QTY: 18 gram Days: 30 Refills: 6  Written: 02/09/20 Patient Instructions: inhale 1-2 puffs every 6-8 hours as needed for shortness of breath, and 15 minutes before exercise., Disp: , Rfl:  .  celecoxib (CELEBREX) 200 MG capsule, Celecoxib 200 MG Oral Capsule QTY: 30 capsule Days: 30 Refills: 2  Written: 02/09/20 Patient Instructions: once a day, Disp: , Rfl:  .  enalapril (VASOTEC) 5 MG tablet, Enalapril Maleate 5 MG Oral Tablet QTY: 90 tablet Days: 90 Refills: 2  Written: 02/09/20 Patient Instructions: Take 1 tablet by mouth ONCE daily., Disp: , Rfl:  .  atorvastatin (LIPITOR) 20 MG tablet, Take 20 mg by mouth daily., Disp: , Rfl:  .  BREWERS YEAST PO, Take 1  Dose by mouth daily., Disp: , Rfl:  .  carvedilol (COREG) 12.5 MG tablet, Take 12.5 mg by mouth 2 (two) times daily., Disp: , Rfl:  .  Cholecalciferol (VITAMIN D) 50 MCG (2000 UT) tablet, Take 2,000 Units by mouth daily., Disp: , Rfl:  .  citalopram (CELEXA) 20 MG tablet, Take 20 mg by mouth daily., Disp: , Rfl:  .  Coenzyme Q10 (COQ-10) 100 MG CAPS, Take 100 mg by mouth daily., Disp: , Rfl:  .  doxazosin (CARDURA) 1 MG tablet, Take 1 tablet (1 mg total) by mouth at bedtime., Disp: 90 tablet, Rfl: 3 .  finasteride (PROSCAR) 5 MG tablet, Take 1 tablet (5 mg total) by mouth daily., Disp: 90 tablet, Rfl: 3 .  lisinopril-hydrochlorothiazide (PRINZIDE,ZESTORETIC) 20-12.5 MG tablet, Take 1 tablet by mouth daily., Disp: , Rfl:  .  magnesium oxide (MAG-OX) 400 MG tablet, Take by mouth., Disp: , Rfl:  .  Naproxen Sod-diphenhydrAMINE (ALEVE PM) 220-25 MG TABS, Take 2 tablets by mouth at bedtime as needed (sleep)., Disp: , Rfl:  .  pantoprazole (PROTONIX) 40 MG tablet, Take 40 mg by mouth daily as needed (acid reflux). , Disp: , Rfl:  .  Polypodium Leucotomos (HELIOCARE PO), Take by mouth., Disp: , Rfl:   Social History   Tobacco Use  Smoking Status Former  Smoker  . Packs/day: 1.00  . Years: 20.00  . Pack years: 20.00  . Types: Cigarettes  . Quit date: 09/21/2013  . Years since quitting: 6.7  Smokeless Tobacco Never Used  Tobacco Comment   quit on and off over the years    No Known Allergies Objective:  There were no vitals filed for this visit. There is no height or weight on file to calculate BMI. Constitutional Well developed. Well nourished.  Vascular Dorsalis pedis pulses palpable bilaterally. Posterior tibial pulses palpable bilaterally. Capillary refill normal to all digits.  No cyanosis or clubbing noted. Pedal hair growth normal.  Neurologic Normal speech. Oriented to person, place, and time. Epicritic sensation to light touch grossly present bilaterally.  Dermatologic Painful  ingrowing nail at lateral nail borders of the hallux nail left. No other open wounds. No skin lesions.  Orthopedic: Normal joint ROM without pain or crepitus bilaterally. No visible deformities. No bony tenderness.   Radiographs: None Assessment:   1. Ingrown left big toenail    Plan:  Patient was evaluated and treated and all questions answered.  Ingrown Nail, left -Patient elects to proceed with minor surgery to remove ingrown toenail removal today. Consent reviewed and signed by patient. -Ingrown nail excised. See procedure note. -Educated on post-procedure care including soaking. Written instructions provided and reviewed. -Patient to follow up in 2 weeks for nail check.  Procedure: Excision of Ingrown Toenail Location: Left 1st toe lateral nail borders. Anesthesia: Lidocaine 1% plain; 1.5 mL and Marcaine 0.5% plain; 1.5 mL, digital block. Skin Prep: Betadine. Dressing: Silvadene; telfa; dry, sterile, compression dressing. Technique: Following skin prep, the toe was exsanguinated and a tourniquet was secured at the base of the toe. The affected nail border was freed, split with a nail splitter, and excised. Chemical matrixectomy was then performed with phenol and irrigated out with alcohol. The tourniquet was then removed and sterile dressing applied. Disposition: Patient tolerated procedure well. Patient to return in 2 weeks for follow-up.   No follow-ups on file.

## 2020-06-21 DIAGNOSIS — I5189 Other ill-defined heart diseases: Secondary | ICD-10-CM

## 2020-06-21 DIAGNOSIS — I272 Pulmonary hypertension, unspecified: Secondary | ICD-10-CM

## 2020-06-21 HISTORY — DX: Other ill-defined heart diseases: I51.89

## 2020-06-21 HISTORY — DX: Pulmonary hypertension, unspecified: I27.20

## 2020-08-08 ENCOUNTER — Ambulatory Visit: Payer: Medicare HMO | Admitting: Dermatology

## 2020-08-08 ENCOUNTER — Other Ambulatory Visit: Payer: Self-pay

## 2020-08-08 DIAGNOSIS — L578 Other skin changes due to chronic exposure to nonionizing radiation: Secondary | ICD-10-CM | POA: Diagnosis not present

## 2020-08-08 DIAGNOSIS — L821 Other seborrheic keratosis: Secondary | ICD-10-CM | POA: Diagnosis not present

## 2020-08-08 DIAGNOSIS — L570 Actinic keratosis: Secondary | ICD-10-CM

## 2020-08-08 NOTE — Progress Notes (Signed)
   Follow-Up Visit   Subjective  James [name] is a 84 y.o. male who presents for the following: Actinic Keratosis (recheck the face and scalp for new or persistent skin lesions).  The following portions of the chart were reviewed this encounter and updated as appropriate:  Tobacco  Allergies  Meds  Problems  Med Hx  Surg Hx  Fam Hx     Review of Systems:  No other skin or systemic complaints except as noted in HPI or Assessment and Plan.  Objective  Well appearing patient in no apparent distress; mood and affect are within normal limits.  A focused examination was performed including the face and scalp. Relevant physical exam findings are noted in the Assessment and Plan.  Objective  Face and scalp x 10 (10): Erythematous thin papules/macules with gritty scale.   Assessment & Plan  AK (actinic keratosis) (10) Face and scalp x 10  Destruction of lesion - Face and scalp x 10 Complexity: simple   Destruction method: cryotherapy   Informed consent: discussed and consent obtained   Timeout:  patient name, date of birth, surgical site, and procedure verified Lesion destroyed using liquid nitrogen: Yes   Region frozen until ice ball extended beyond lesion: Yes   Outcome: patient tolerated procedure well with no complications   Post-procedure details: wound care instructions given     Actinic Damage - chronic, secondary to cumulative UV radiation exposure/sun exposure over time - diffuse scaly erythematous macules with underlying dyspigmentation - Recommend daily broad spectrum sunscreen SPF 30+ to sun-exposed areas, reapply every 2 hours as needed.  - Call for new or changing lesions. - Discussed field treatment - consider PDT on follow up appointment.  Seborrheic Keratoses - Stuck-on, waxy, tan-brown papules and plaques  - Discussed benign etiology and prognosis. - Observe - Call for any changes  Return in about 6 months (around 02/05/2021).  Luther Redo,  CMA, am acting as scribe for Sarina Ser, MD .  Documentation: I have reviewed the above documentation for accuracy and completeness, and I agree with the above.  Sarina Ser, MD

## 2020-08-20 ENCOUNTER — Encounter: Payer: Self-pay | Admitting: Dermatology

## 2020-10-03 DIAGNOSIS — I340 Nonrheumatic mitral (valve) insufficiency: Secondary | ICD-10-CM | POA: Diagnosis not present

## 2020-10-03 DIAGNOSIS — R0602 Shortness of breath: Secondary | ICD-10-CM | POA: Diagnosis not present

## 2020-10-03 DIAGNOSIS — I2510 Atherosclerotic heart disease of native coronary artery without angina pectoris: Secondary | ICD-10-CM | POA: Diagnosis not present

## 2020-10-03 DIAGNOSIS — I351 Nonrheumatic aortic (valve) insufficiency: Secondary | ICD-10-CM | POA: Diagnosis not present

## 2020-10-22 DIAGNOSIS — K5900 Constipation, unspecified: Secondary | ICD-10-CM | POA: Diagnosis not present

## 2020-10-22 DIAGNOSIS — Z1211 Encounter for screening for malignant neoplasm of colon: Secondary | ICD-10-CM | POA: Diagnosis not present

## 2020-10-22 DIAGNOSIS — K2270 Barrett's esophagus without dysplasia: Secondary | ICD-10-CM | POA: Diagnosis not present

## 2020-11-05 DIAGNOSIS — E782 Mixed hyperlipidemia: Secondary | ICD-10-CM | POA: Diagnosis not present

## 2020-11-05 DIAGNOSIS — I10 Essential (primary) hypertension: Secondary | ICD-10-CM | POA: Diagnosis not present

## 2020-11-05 DIAGNOSIS — I2510 Atherosclerotic heart disease of native coronary artery without angina pectoris: Secondary | ICD-10-CM | POA: Diagnosis not present

## 2020-11-06 ENCOUNTER — Ambulatory Visit: Payer: Medicare HMO | Admitting: Dermatology

## 2020-11-06 ENCOUNTER — Other Ambulatory Visit: Payer: Self-pay

## 2020-11-06 ENCOUNTER — Encounter: Payer: Self-pay | Admitting: Dermatology

## 2020-11-06 DIAGNOSIS — L821 Other seborrheic keratosis: Secondary | ICD-10-CM

## 2020-11-06 DIAGNOSIS — L820 Inflamed seborrheic keratosis: Secondary | ICD-10-CM | POA: Diagnosis not present

## 2020-11-06 NOTE — Progress Notes (Signed)
   Follow-Up Visit   Subjective  James [name] is a 85 y.o. male who presents for the following: Lesion (L thigh - patient noticed it a "while ago" when he was laying in bed he noticed he was picking at it. It then started to bleed and crust over. He is concerned and would like it checked today ).  The following portions of the chart were reviewed this encounter and updated as appropriate:   Tobacco  Allergies  Meds  Problems  Med Hx  Surg Hx  Fam Hx     Review of Systems:  No other skin or systemic complaints except as noted in HPI or Assessment and Plan.  Objective  Well appearing patient in no apparent distress; mood and affect are within normal limits.  A focused examination was performed including the legs . Relevant physical exam findings are noted in the Assessment and Plan.  Objective  L lat thigh x 1: Erythematous keratotic or waxy stuck-on papule or plaque.   Assessment & Plan  Inflamed seborrheic keratosis L lat thigh x 1  Destruction of lesion - L lat thigh x 1 Complexity: simple   Destruction method: cryotherapy   Informed consent: discussed and consent obtained   Timeout:  patient name, date of birth, surgical site, and procedure verified Lesion destroyed using liquid nitrogen: Yes   Region frozen until ice ball extended beyond lesion: Yes   Outcome: patient tolerated procedure well with no complications   Post-procedure details: wound care instructions given    Seborrheic Keratoses - leg - Stuck-on, waxy, tan-brown papules and plaques  - Discussed benign etiology and prognosis. - Observe - Call for any changes  Return for appointment as scheduled.  Luther Redo, CMA, am acting as scribe for Sarina Ser, MD .  Documentation: I have reviewed the above documentation for accuracy and completeness, and I agree with the above.  Sarina Ser, MD

## 2020-11-20 DIAGNOSIS — I10 Essential (primary) hypertension: Secondary | ICD-10-CM | POA: Diagnosis not present

## 2020-11-20 DIAGNOSIS — I2510 Atherosclerotic heart disease of native coronary artery without angina pectoris: Secondary | ICD-10-CM | POA: Diagnosis not present

## 2020-11-20 DIAGNOSIS — E782 Mixed hyperlipidemia: Secondary | ICD-10-CM | POA: Diagnosis not present

## 2020-11-27 DIAGNOSIS — I70209 Unspecified atherosclerosis of native arteries of extremities, unspecified extremity: Secondary | ICD-10-CM | POA: Diagnosis not present

## 2020-11-27 DIAGNOSIS — R69 Illness, unspecified: Secondary | ICD-10-CM | POA: Diagnosis not present

## 2020-11-27 DIAGNOSIS — I2510 Atherosclerotic heart disease of native coronary artery without angina pectoris: Secondary | ICD-10-CM | POA: Diagnosis not present

## 2020-11-27 DIAGNOSIS — M1990 Unspecified osteoarthritis, unspecified site: Secondary | ICD-10-CM | POA: Diagnosis not present

## 2020-11-27 DIAGNOSIS — I4891 Unspecified atrial fibrillation: Secondary | ICD-10-CM | POA: Diagnosis not present

## 2020-11-27 DIAGNOSIS — E663 Overweight: Secondary | ICD-10-CM | POA: Diagnosis not present

## 2020-11-27 DIAGNOSIS — E785 Hyperlipidemia, unspecified: Secondary | ICD-10-CM | POA: Diagnosis not present

## 2020-11-27 DIAGNOSIS — Z008 Encounter for other general examination: Secondary | ICD-10-CM | POA: Diagnosis not present

## 2020-11-27 DIAGNOSIS — I10 Essential (primary) hypertension: Secondary | ICD-10-CM | POA: Diagnosis not present

## 2020-11-27 DIAGNOSIS — K219 Gastro-esophageal reflux disease without esophagitis: Secondary | ICD-10-CM | POA: Diagnosis not present

## 2020-12-20 DIAGNOSIS — N200 Calculus of kidney: Secondary | ICD-10-CM | POA: Diagnosis not present

## 2020-12-20 DIAGNOSIS — I2510 Atherosclerotic heart disease of native coronary artery without angina pectoris: Secondary | ICD-10-CM | POA: Diagnosis not present

## 2020-12-20 DIAGNOSIS — I10 Essential (primary) hypertension: Secondary | ICD-10-CM | POA: Diagnosis not present

## 2020-12-20 DIAGNOSIS — R319 Hematuria, unspecified: Secondary | ICD-10-CM | POA: Diagnosis not present

## 2021-01-02 ENCOUNTER — Telehealth: Payer: Self-pay

## 2021-01-02 ENCOUNTER — Other Ambulatory Visit: Payer: Self-pay | Admitting: Internal Medicine

## 2021-01-02 ENCOUNTER — Encounter: Payer: Self-pay | Admitting: Urology

## 2021-01-02 ENCOUNTER — Ambulatory Visit: Payer: Medicare HMO | Admitting: Urology

## 2021-01-02 ENCOUNTER — Other Ambulatory Visit: Payer: Self-pay

## 2021-01-02 VITALS — BP 195/81 | HR 78 | Ht 69.0 in | Wt 182.0 lb

## 2021-01-02 DIAGNOSIS — N401 Enlarged prostate with lower urinary tract symptoms: Secondary | ICD-10-CM

## 2021-01-02 DIAGNOSIS — I2510 Atherosclerotic heart disease of native coronary artery without angina pectoris: Secondary | ICD-10-CM | POA: Diagnosis not present

## 2021-01-02 DIAGNOSIS — N130 Hydronephrosis with ureteropelvic junction obstruction: Secondary | ICD-10-CM | POA: Diagnosis not present

## 2021-01-02 DIAGNOSIS — N200 Calculus of kidney: Secondary | ICD-10-CM | POA: Diagnosis not present

## 2021-01-02 DIAGNOSIS — N138 Other obstructive and reflux uropathy: Secondary | ICD-10-CM | POA: Diagnosis not present

## 2021-01-02 DIAGNOSIS — R310 Gross hematuria: Secondary | ICD-10-CM

## 2021-01-02 DIAGNOSIS — Z951 Presence of aortocoronary bypass graft: Secondary | ICD-10-CM | POA: Diagnosis not present

## 2021-01-02 DIAGNOSIS — I10 Essential (primary) hypertension: Secondary | ICD-10-CM | POA: Diagnosis not present

## 2021-01-02 NOTE — Telephone Encounter (Signed)
OK, lets try and get him to bring disc if possible, would be very helpful  Nickolas Madrid, MD 01/02/2021

## 2021-01-02 NOTE — Telephone Encounter (Signed)
Incoming call from Faythe Dingwall, [place] student at [employer] urology. She calls to make [place] aware of a mutual patient who is scheduled for next Wednesday however pt has had an increase in hematuria and they question if pt needs to be seen sooner. Pt was seen and accessed by Hoy Register, MD. CT shows 1.2cm stone with probable obstruction. CT also shows hydronephrosis. Patient denies pain currently. Pt added onto scheduled for today for evaluation. [facility] will fax records on patient now.

## 2021-01-02 NOTE — Progress Notes (Signed)
   01/02/2021 5:03 PM   [name] [birth year] [id number]  Reason for visit: Gross hematuria, history of BPH and nephrolithiasis  HPI: I saw Mr. James [name] today for the above issues.  He is an 85 year old male who I regularly followed for BPH who is on maximal medical therapy with finasteride and doxazosin.  He has a history of kidney stones.  He developed painless gross hematuria intermittently for multiple days in March as well as the beginning of April which prompted a visit to his PCP and ultimately a CT without contrast.  I was able to personally review and interpret these films and he brought the disc with him today.  The left kidney is normal-appearing with no hydronephrosis and there is a 1 cm nonobstructive lower pole stone.  The right side is more challenging to interpret and may represent severe hydronephrosis versus parapelvic cysts versus chronic UPJ obstruction, and there is a calcification posteriorly, unclear if this is within a cyst or at the UPJ, and is more posterior than I would expect for a UPJ stone.  He has had some intermittent bilateral low back pain, but no renal colic.  He denies any dysuria.  He is on Xarelto for cardiac indication.  Urinalysis today 0-5 WBCs, greater than 30 RBCs, no bacteria, nitrite negative, no leukocytes.  We reviewed his images today.  I am not sure the etiology of his gross hematuria, and I think he warrants a full gross hematuria work-up with a contrasted CT scan as well as a cystoscopy.  The delayed films will also help better identify if there is a stone at the UPJ, versus this is just posterior in a calyx or cyst, as well as better delineate the renal pelvis and if there is any true dilation of the right-sided collecting system. We discussed common possible etiologies of hematuria including BPH, malignancy, urolithiasis, medical renal disease, and idiopathic. Standard workup recommended by the [facility] includes imaging with CT urogram to assess the  upper tracts, and cystoscopy. Cytology is performed on patient's with gross hematuria to look for malignant cells in the urine.  CT urogram and cystoscopy for further evaluation of gross hematuria  Billey Co, MD  [place] [address] [place],  [address] [phone]

## 2021-01-06 LAB — URINALYSIS, COMPLETE
Bilirubin, UA: NEGATIVE
Glucose, UA: NEGATIVE
Ketones, UA: NEGATIVE
Leukocytes,UA: NEGATIVE
Nitrite, UA: NEGATIVE
Protein,UA: NEGATIVE
Specific Gravity, UA: 1.015 (ref 1.005–1.030)
Urobilinogen, Ur: 0.2 mg/dL (ref 0.2–1.0)
pH, UA: 7 (ref 5.0–7.5)

## 2021-01-06 LAB — MICROSCOPIC EXAMINATION
Bacteria, UA: NONE SEEN
RBC, Urine: >30 /hpf — AB (ref 0–2)

## 2021-01-08 ENCOUNTER — Ambulatory Visit: Payer: Medicare HMO | Admitting: Urology

## 2021-01-10 ENCOUNTER — Other Ambulatory Visit: Payer: Self-pay

## 2021-01-10 ENCOUNTER — Ambulatory Visit (HOSPITAL_COMMUNITY)
Admission: RE | Admit: 2021-01-10 | Discharge: 2021-01-10 | Disposition: A | Discharge status: Home / Self Care | Payer: Medicare HMO | Source: Ambulatory Visit | Attending: Urology | Admitting: Urology

## 2021-01-10 ENCOUNTER — Other Ambulatory Visit: Payer: Self-pay | Admitting: Urology

## 2021-01-10 DIAGNOSIS — R310 Gross hematuria: Secondary | ICD-10-CM | POA: Diagnosis not present

## 2021-01-10 DIAGNOSIS — I708 Atherosclerosis of other arteries: Secondary | ICD-10-CM | POA: Diagnosis not present

## 2021-01-10 DIAGNOSIS — I714 Abdominal aortic aneurysm, without rupture: Secondary | ICD-10-CM | POA: Diagnosis not present

## 2021-01-10 DIAGNOSIS — N200 Calculus of kidney: Secondary | ICD-10-CM | POA: Diagnosis not present

## 2021-01-10 DIAGNOSIS — I77819 Aortic ectasia, unspecified site: Secondary | ICD-10-CM

## 2021-01-10 HISTORY — DX: Aortic ectasia, unspecified site: I77.819

## 2021-01-10 LAB — POCT I-STAT CREATININE: Creatinine, Ser: 0.9 mg/dL (ref 0.61–1.24)

## 2021-01-10 MED ORDER — IOHEXOL 350 MG/ML SOLN: 100.0000 mL | Freq: Once | INTRAVENOUS | Status: DC | PRN

## 2021-01-10 MED ORDER — SODIUM CHLORIDE 0.9 % IV SOLN
INTRAVENOUS | Status: AC
  Filled 2021-01-10: qty 250

## 2021-01-10 MED ORDER — IOHEXOL 300 MG/ML  SOLN
100.0000 mL | Freq: Once | INTRAMUSCULAR | Status: AC | PRN
  Administered 2021-01-10: 100 mL via INTRAVENOUS

## 2021-01-16 ENCOUNTER — Encounter: Payer: Self-pay | Admitting: Urology

## 2021-01-16 ENCOUNTER — Ambulatory Visit: Payer: Medicare HMO | Admitting: Urology

## 2021-01-16 ENCOUNTER — Other Ambulatory Visit: Payer: Self-pay

## 2021-01-16 VITALS — BP 168/88 | HR 78 | Ht 69.0 in | Wt 178.0 lb

## 2021-01-16 DIAGNOSIS — N200 Calculus of kidney: Secondary | ICD-10-CM

## 2021-01-16 DIAGNOSIS — R310 Gross hematuria: Secondary | ICD-10-CM | POA: Diagnosis not present

## 2021-01-16 NOTE — Progress Notes (Signed)
Cystoscopy Procedure Note:  Indication: Gross hematuria  After informed consent and discussion of the procedure and its risks, James [name] was positioned and prepped in the standard fashion. Cystoscopy was performed with a flexible cystoscope. The urethra, bladder neck and entire bladder was visualized in a standard fashion. The prostate was moderate in size with a small median lobe. The ureteral orifices were visualized in their normal location and orientation.  Bladder mucosa grossly normal throughout, no abnormalities on retroflexion  Imaging: I personally viewed and interpreted the CT urogram dated 01/12/2021.  Large right parapelvic cysts, but no hydronephrosis.  8 mm right renal pelvis stone with no ureteral stones or stranding, no enhancing lesions.  8 mm left lower pole stone.  Findings: Normal cystoscopy.   Assessment and Plan:  Suspect intermittent ball-valving of right renal pelvis stone causing gross hematuria and potentially his 1 bout of flank pain  We discussed options including observation, ureteroscopy and laser lithotripsy, or shockwave lithotripsy.  Risks and benefits discussed extensively.  He has not had any further gross hematuria or pain, and he would like to continue with observation.  Return precautions discussed.  RTC 6 months with KUB  Nickolas Madrid, MD 01/16/2021

## 2021-02-06 ENCOUNTER — Other Ambulatory Visit: Payer: Self-pay

## 2021-02-06 ENCOUNTER — Ambulatory Visit: Payer: Medicare HMO | Admitting: Dermatology

## 2021-02-06 DIAGNOSIS — Z1283 Encounter for screening for malignant neoplasm of skin: Secondary | ICD-10-CM | POA: Diagnosis not present

## 2021-02-06 DIAGNOSIS — L820 Inflamed seborrheic keratosis: Secondary | ICD-10-CM

## 2021-02-06 DIAGNOSIS — L578 Other skin changes due to chronic exposure to nonionizing radiation: Secondary | ICD-10-CM | POA: Diagnosis not present

## 2021-02-06 DIAGNOSIS — L814 Other melanin hyperpigmentation: Secondary | ICD-10-CM

## 2021-02-06 DIAGNOSIS — D1800 Hemangioma unspecified site: Secondary | ICD-10-CM | POA: Diagnosis not present

## 2021-02-06 DIAGNOSIS — L570 Actinic keratosis: Secondary | ICD-10-CM | POA: Diagnosis not present

## 2021-02-06 DIAGNOSIS — L821 Other seborrheic keratosis: Secondary | ICD-10-CM

## 2021-02-06 DIAGNOSIS — Z85828 Personal history of other malignant neoplasm of skin: Secondary | ICD-10-CM

## 2021-02-06 DIAGNOSIS — D229 Melanocytic nevi, unspecified: Secondary | ICD-10-CM

## 2021-02-06 NOTE — Patient Instructions (Addendum)
If you have any questions or concerns for your doctor, please call our main line at [phone] and press option 4 to reach your doctor's medical assistant. If no one answers, please leave a voicemail as directed and we will return your call as soon as possible. Messages left after 4 pm will be answered the following business day.   You may also send [place] a message via [facility]. We typically respond to MyChart messages within 1-2 business days.  For prescription refills, please ask your pharmacy to contact our office. Our fax number is [phone].  If you have an urgent issue when the clinic is closed that cannot wait until the next business day, you can page your doctor at the number below.    Please note that while we do our best to be available for urgent issues outside of office hours, we are not available 24/7.   If you have an urgent issue and are unable to reach [place], you may choose to seek medical care at your doctor's office, retail clinic, urgent care center, or emergency room.  If you have a medical emergency, please immediately call 911 or go to the emergency department.  Pager Numbers  - Dr. Nehemiah Massed: [phone]  - Dr. Laurence Ferrari: [phone]  - Dr. Nicole Kindred: [phone]  In the event of inclement weather, please call our main line at [phone] for an update on the status of any delays or closures.  Dermatology Medication Tips: Please keep the boxes that topical medications come in in order to help keep track of the instructions about where and how to use these. Pharmacies typically print the medication instructions only on the boxes and not directly on the medication tubes.   If your medication is too expensive, please contact our office at [phone] option 4 or send [place] a message through [facility].   We are unable to tell what your co-pay for medications will be in advance as this is different depending on your insurance coverage. However, we may be able to find a substitute  medication at lower cost or fill out paperwork to get insurance to cover a needed medication.   If a prior authorization is required to get your medication covered by your insurance company, please allow [place] 1-2 business days to complete this process.  Drug prices often vary depending on where the prescription is filled and some pharmacies may offer cheaper prices.  The website www.goodrx.com contains coupons for medications through different pharmacies. The prices here do not account for what the cost may be with help from insurance (it may be cheaper with your insurance), but the website can give you the price if you did not use any insurance.  - You can print the associated coupon and take it with your prescription to the pharmacy.  - You may also stop by our office during regular business hours and pick up a GoodRx coupon card.  - If you need your prescription sent electronically to a different pharmacy, notify our office through [facility] or by phone at [phone] option 4.   Levulan/PDT Treatment Common Side Effects  - Burning/stinging, which may be severe and last up to 24-72 hours after your treatment  - Redness, swelling and/or peeling which may last up to 4 weeks  - Scaling/crusting which may last up to 2 weeks  - Sun sensitivity (you MUST avoid sun exposure for 48-72 hours after treatment)  Care Instructions  - Okay to wash with soap and water and shampoo as normal  -  If needed, you can do a cold compress (ex. Ice packs) for comfort  - If okay with your Primary Doctor, you may use analgesics such as Tylenol every 4-6 hours, not to exceed recommended dose  - You may apply Cerave Healing Ointment, Vaseline or Aquaphor  - If you have a lot of swelling you may take a Benadryl to help with this (this may cause drowsiness)  Sun Precautions  - Wear a wide brim hat for the next week if outside  - Wear a sunblock with zinc or titanium dioxide at least SPF 50  daily   We will recheck you in 10-12 weeks. If any problems, please call the office and ask to speak with a nurse. 

## 2021-02-06 NOTE — Progress Notes (Signed)
Follow-Up Visit   Subjective  James [name] is a 85 y.o. male who presents for the following: Follow-up (6 months f/u hx of Aks ). Hx of BCC right nasal tip. Pt c/o irritated growths on his neck. The patient presents for Upper Body Skin Exam (UBSE) for skin cancer screening and mole check.  The following portions of the chart were reviewed this encounter and updated as appropriate:   Tobacco  Allergies  Meds  Problems  Med Hx  Surg Hx  Fam Hx     Review of Systems:  No other skin or systemic complaints except as noted in HPI or Assessment and Plan.  Objective  Well appearing patient in no apparent distress; mood and affect are within normal limits.  All skin waist up examined.  Objective  scalp (20): Erythematous thin papules/macules with gritty scale.   Objective  Neck (2): Erythematous keratotic or waxy stuck-on papule or plaque.    Assessment & Plan  AK (actinic keratosis) (20) scalp  Actinic keratoses are precancerous spots that appear secondary to cumulative UV radiation exposure/sun exposure over time. They are chronic with expected duration over 1 year. A portion of actinic keratoses will progress to squamous cell carcinoma of the skin. It is not possible to reliably predict which spots will progress to skin cancer and so treatment is recommended to prevent development of skin cancer.  Recommend daily broad spectrum sunscreen SPF 30+ to sun-exposed areas, reapply every 2 hours as needed.  Recommend staying in the shade or wearing long sleeves, sun glasses (UVA+UVB protection) and wide brim hats (4-inch brim around the entire circumference of the hat). Call for new or changing lesions.   Destruction of lesion - scalp Complexity: simple   Destruction method: cryotherapy   Informed consent: discussed and consent obtained   Timeout:  patient name, date of birth, surgical site, and procedure verified Lesion destroyed using liquid nitrogen: Yes   Region frozen  until ice ball extended beyond lesion: Yes   Outcome: patient tolerated procedure well with no complications   Post-procedure details: wound care instructions given    Inflamed seborrheic keratosis (2) Neck  Destruction of lesion - Neck Complexity: simple   Destruction method: cryotherapy   Informed consent: discussed and consent obtained   Timeout:  patient name, date of birth, surgical site, and procedure verified Lesion destroyed using liquid nitrogen: Yes   Region frozen until ice ball extended beyond lesion: Yes   Outcome: patient tolerated procedure well with no complications   Post-procedure details: wound care instructions given    Skin cancer screening   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  - Call if any new or changing lesions are noted between office visits  Lentigines - Scattered tan macules - Due to sun exposure - Benign-appering, observe - Recommend daily broad spectrum sunscreen SPF 30+ to sun-exposed areas, reapply every 2 hours as needed. - Call for any changes  Seborrheic Keratoses - Stuck-on, waxy, tan-brown papules and/or plaques  - Benign-appearing - Discussed benign etiology and prognosis. - Observe - Call for any changes  Melanocytic Nevi - Tan-brown and/or pink-flesh-colored symmetric macules and papules - Benign appearing on exam today - Observation - Call clinic for new or changing moles - Recommend daily use of broad spectrum spf 30+  sunscreen to sun-exposed areas.   Hemangiomas - Red papules - Discussed benign nature - Observe - Call for any changes  Actinic Damage - Chronic condition, secondary to cumulative UV/sun exposure - diffuse scaly erythematous macules with underlying dyspigmentation - Recommend daily broad spectrum  sunscreen SPF 30+ to sun-exposed areas, reapply every 2 hours as needed.  - Staying in the shade or wearing long sleeves, sun glasses (UVA+UVB protection) and wide brim hats (4-inch brim around the entire circumference of the hat) are also recommended for sun protection.  - Call for new or changing lesions.  Actinic Damage - Severe, confluent actinic changes with pre-cancerous actinic keratoses  - Severe, chronic, not at goal, secondary to cumulative UV radiation exposure over time - diffuse scaly erythematous macules and papules with underlying dyspigmentation - Discussed Prescription "Field Treatment" for Severe, Chronic Confluent Actinic Changes with Pre-Cancerous Actinic Keratoses PDT on scalp in 4 weeks, PDT on face in 6 weeks  Field treatment involves treatment of an entire area of skin that has confluent Actinic Changes (Sun/ Ultraviolet light damage) and PreCancerous Actinic Keratoses by method of PhotoDynamic Therapy (PDT) and/or prescription Topical Chemotherapy agents such as 5-fluorouracil, 5-fluorouracil/calcipotriene, and/or imiquimod.  The purpose is to decrease the number of clinically evident and subclinical PreCancerous lesions to prevent progression to development of skin cancer by chemically destroying early precancer changes that may or may not be visible.  It has been shown to reduce the risk of developing skin cancer in the treated area. As a result of treatment, redness, scaling, crusting, and open sores may occur during treatment course. One or more than one of these methods may be used and may have to be used several times to control, suppress and eliminate the PreCancerous changes. Discussed treatment course, expected reaction, and possible side effects. - Recommend daily broad spectrum sunscreen SPF 30+ to sun-exposed areas, reapply every 2 hours as needed.  - Staying in the shade or wearing long sleeves, sun glasses (UVA+UVB protection) and wide brim hats (4-inch brim around  the entire circumference of the hat) are also recommended. - Call for new or changing lesions.  Skin cancer screening performed today.  Return in about 4 weeks (around 03/06/2021) for PDT scalp,6 weeks PDT face, 6 months AKs.  IMarye Round, CMA, am acting as scribe for Sarina Ser, MD .  Documentation: I have reviewed the above documentation for accuracy and completeness, and I agree with the above.  Sarina Ser, MD

## 2021-02-14 ENCOUNTER — Encounter: Payer: Self-pay | Admitting: Dermatology

## 2021-03-06 ENCOUNTER — Ambulatory Visit: Payer: Medicare HMO

## 2021-03-06 ENCOUNTER — Other Ambulatory Visit: Payer: Self-pay

## 2021-03-06 DIAGNOSIS — L570 Actinic keratosis: Secondary | ICD-10-CM

## 2021-03-06 MED ORDER — AMINOLEVULINIC ACID HCL 20 % EX SOLR
1.0000 | Freq: Once | CUTANEOUS | Status: AC
Start: 2021-03-06 — End: 2021-03-06
  Administered 2021-03-06: 354 mg via TOPICAL

## 2021-03-06 NOTE — Progress Notes (Signed)
1. AK (actinic keratosis) Scalp  Photodynamic therapy - Scalp Procedure discussed: discussed risks, benefits, side effects. and alternatives   Prep: site scrubbed/prepped with acetone   Location:  Scalp Number of lesions:  Multiple Type of treatment:  Blue light Aminolevulinic Acid (see MAR for details): Levulan Number of Levulan sticks used:  1 Incubation time (minutes):  120 Number of minutes under lamp:  16 Number of seconds under lamp:  40 Cooling:  Floor fan Outcome: patient tolerated procedure well with no complications   Post-procedure details: sunscreen applied and aftercare instructions given to patient    Aminolevulinic Acid HCl 20 % SOLR 354 mg - Scalp

## 2021-03-06 NOTE — Patient Instructions (Signed)

## 2021-03-13 DIAGNOSIS — I10 Essential (primary) hypertension: Secondary | ICD-10-CM | POA: Diagnosis not present

## 2021-03-13 DIAGNOSIS — M2041 Other hammer toe(s) (acquired), right foot: Secondary | ICD-10-CM | POA: Diagnosis not present

## 2021-03-13 DIAGNOSIS — Q845 Enlarged and hypertrophic nails: Secondary | ICD-10-CM | POA: Diagnosis not present

## 2021-03-13 DIAGNOSIS — I2510 Atherosclerotic heart disease of native coronary artery without angina pectoris: Secondary | ICD-10-CM | POA: Diagnosis not present

## 2021-03-13 DIAGNOSIS — I739 Peripheral vascular disease, unspecified: Secondary | ICD-10-CM | POA: Diagnosis not present

## 2021-03-13 DIAGNOSIS — E782 Mixed hyperlipidemia: Secondary | ICD-10-CM | POA: Diagnosis not present

## 2021-03-14 DIAGNOSIS — I10 Essential (primary) hypertension: Secondary | ICD-10-CM | POA: Diagnosis not present

## 2021-03-14 DIAGNOSIS — E782 Mixed hyperlipidemia: Secondary | ICD-10-CM | POA: Diagnosis not present

## 2021-03-14 DIAGNOSIS — I2510 Atherosclerotic heart disease of native coronary artery without angina pectoris: Secondary | ICD-10-CM | POA: Diagnosis not present

## 2021-03-18 DIAGNOSIS — I2510 Atherosclerotic heart disease of native coronary artery without angina pectoris: Secondary | ICD-10-CM | POA: Diagnosis not present

## 2021-03-18 DIAGNOSIS — Q845 Enlarged and hypertrophic nails: Secondary | ICD-10-CM | POA: Diagnosis not present

## 2021-03-18 DIAGNOSIS — I739 Peripheral vascular disease, unspecified: Secondary | ICD-10-CM | POA: Diagnosis not present

## 2021-03-18 DIAGNOSIS — M2041 Other hammer toe(s) (acquired), right foot: Secondary | ICD-10-CM | POA: Diagnosis not present

## 2021-03-18 DIAGNOSIS — I10 Essential (primary) hypertension: Secondary | ICD-10-CM | POA: Diagnosis not present

## 2021-03-18 DIAGNOSIS — E782 Mixed hyperlipidemia: Secondary | ICD-10-CM | POA: Diagnosis not present

## 2021-03-20 ENCOUNTER — Ambulatory Visit: Payer: Medicare HMO

## 2021-03-26 ENCOUNTER — Ambulatory Visit: Payer: Self-pay | Admitting: Urology

## 2021-03-27 ENCOUNTER — Other Ambulatory Visit: Payer: Self-pay

## 2021-03-27 ENCOUNTER — Ambulatory Visit: Payer: Medicare HMO

## 2021-03-27 DIAGNOSIS — L570 Actinic keratosis: Secondary | ICD-10-CM

## 2021-03-27 MED ORDER — AMINOLEVULINIC ACID HCL 20 % EX SOLR
1.0000 | Freq: Once | CUTANEOUS | Status: AC
Start: 2021-03-27 — End: 2021-03-27
  Administered 2021-03-27: 354 mg via TOPICAL

## 2021-03-27 NOTE — Patient Instructions (Signed)

## 2021-03-27 NOTE — Progress Notes (Signed)
Patient completed PDT therapy today.  1. AK (actinic keratosis) Head - Anterior (Face)  Photodynamic therapy - Head - Anterior (Face) Procedure discussed: discussed risks, benefits, side effects. and alternatives   Prep: site scrubbed/prepped with acetone   Location:  Face Number of lesions:  Multiple Type of treatment:  Blue light Aminolevulinic Acid (see MAR for details): Levulan Number of Levulan sticks used:  1 Incubation time (minutes):  60 Number of minutes under lamp:  16 Number of seconds under lamp:  40 Cooling:  Floor fan Outcome: patient tolerated procedure well with no complications   Post-procedure details: sunscreen applied    Aminolevulinic Acid HCl 20 % SOLR 354 mg - Head - Anterior (Face)

## 2021-04-25 DIAGNOSIS — R55 Syncope and collapse: Secondary | ICD-10-CM | POA: Diagnosis not present

## 2021-04-25 DIAGNOSIS — I10 Essential (primary) hypertension: Secondary | ICD-10-CM | POA: Diagnosis not present

## 2021-04-25 DIAGNOSIS — I351 Nonrheumatic aortic (valve) insufficiency: Secondary | ICD-10-CM | POA: Diagnosis not present

## 2021-04-25 DIAGNOSIS — I340 Nonrheumatic mitral (valve) insufficiency: Secondary | ICD-10-CM | POA: Diagnosis not present

## 2021-04-25 DIAGNOSIS — I2510 Atherosclerotic heart disease of native coronary artery without angina pectoris: Secondary | ICD-10-CM | POA: Diagnosis not present

## 2021-04-25 DIAGNOSIS — E782 Mixed hyperlipidemia: Secondary | ICD-10-CM | POA: Diagnosis not present

## 2021-04-30 DIAGNOSIS — R001 Bradycardia, unspecified: Secondary | ICD-10-CM | POA: Diagnosis not present

## 2021-05-20 DIAGNOSIS — I351 Nonrheumatic aortic (valve) insufficiency: Secondary | ICD-10-CM | POA: Diagnosis not present

## 2021-05-20 DIAGNOSIS — I4891 Unspecified atrial fibrillation: Secondary | ICD-10-CM | POA: Diagnosis not present

## 2021-05-20 DIAGNOSIS — R0602 Shortness of breath: Secondary | ICD-10-CM | POA: Diagnosis not present

## 2021-05-20 DIAGNOSIS — I10 Essential (primary) hypertension: Secondary | ICD-10-CM | POA: Diagnosis not present

## 2021-05-20 DIAGNOSIS — E782 Mixed hyperlipidemia: Secondary | ICD-10-CM | POA: Diagnosis not present

## 2021-05-20 DIAGNOSIS — I2510 Atherosclerotic heart disease of native coronary artery without angina pectoris: Secondary | ICD-10-CM | POA: Diagnosis not present

## 2021-05-20 DIAGNOSIS — I340 Nonrheumatic mitral (valve) insufficiency: Secondary | ICD-10-CM | POA: Diagnosis not present

## 2021-06-23 ENCOUNTER — Other Ambulatory Visit: Payer: Self-pay

## 2021-06-23 ENCOUNTER — Emergency Department
Admission: EM | Admit: 2021-06-23 | Discharge: 2021-06-23 | Disposition: A | Discharge status: Home / Self Care | Payer: Medicare HMO | Attending: Emergency Medicine | Admitting: Emergency Medicine

## 2021-06-23 ENCOUNTER — Emergency Department: Payer: Medicare HMO

## 2021-06-23 DIAGNOSIS — Z955 Presence of coronary angioplasty implant and graft: Secondary | ICD-10-CM | POA: Diagnosis not present

## 2021-06-23 DIAGNOSIS — R310 Gross hematuria: Secondary | ICD-10-CM | POA: Diagnosis not present

## 2021-06-23 DIAGNOSIS — Z79899 Other long term (current) drug therapy: Secondary | ICD-10-CM | POA: Diagnosis not present

## 2021-06-23 DIAGNOSIS — I129 Hypertensive chronic kidney disease with stage 1 through stage 4 chronic kidney disease, or unspecified chronic kidney disease: Secondary | ICD-10-CM | POA: Insufficient documentation

## 2021-06-23 DIAGNOSIS — R319 Hematuria, unspecified: Secondary | ICD-10-CM | POA: Diagnosis not present

## 2021-06-23 DIAGNOSIS — N281 Cyst of kidney, acquired: Secondary | ICD-10-CM | POA: Diagnosis not present

## 2021-06-23 DIAGNOSIS — N400 Enlarged prostate without lower urinary tract symptoms: Secondary | ICD-10-CM | POA: Diagnosis not present

## 2021-06-23 DIAGNOSIS — Z87891 Personal history of nicotine dependence: Secondary | ICD-10-CM | POA: Diagnosis not present

## 2021-06-23 DIAGNOSIS — N189 Chronic kidney disease, unspecified: Secondary | ICD-10-CM | POA: Insufficient documentation

## 2021-06-23 DIAGNOSIS — J449 Chronic obstructive pulmonary disease, unspecified: Secondary | ICD-10-CM | POA: Diagnosis not present

## 2021-06-23 DIAGNOSIS — I2510 Atherosclerotic heart disease of native coronary artery without angina pectoris: Secondary | ICD-10-CM | POA: Insufficient documentation

## 2021-06-23 DIAGNOSIS — N200 Calculus of kidney: Secondary | ICD-10-CM | POA: Diagnosis not present

## 2021-06-23 DIAGNOSIS — Z85828 Personal history of other malignant neoplasm of skin: Secondary | ICD-10-CM | POA: Insufficient documentation

## 2021-06-23 LAB — URINALYSIS, COMPLETE (UACMP) WITH MICROSCOPIC
Bacteria, UA: NONE SEEN
RBC / HPF: >50 RBC/hpf — ABNORMAL HIGH (ref 0–5)
Specific Gravity, Urine: 1.021 (ref 1.005–1.030)
Squamous Epithelial / HPF: NONE SEEN (ref 0–5)
WBC, UA: >50 WBC/hpf — ABNORMAL HIGH (ref 0–5)

## 2021-06-23 LAB — COMPREHENSIVE METABOLIC PANEL
ALT: 17 U/L (ref 0–44)
AST: 22 U/L (ref 15–41)
Albumin: 4.3 g/dL (ref 3.5–5.0)
Alkaline Phosphatase: 67 U/L (ref 38–126)
Anion gap: 6 (ref 5–15)
BUN: 23 mg/dL (ref 8–23)
CO2: 24 mmol/L (ref 22–32)
Calcium: 9.4 mg/dL (ref 8.9–10.3)
Chloride: 109 mmol/L (ref 98–111)
Creatinine, Ser: 0.89 mg/dL (ref 0.61–1.24)
GFR, Estimated: >60 mL/min (ref >60)
Glucose, Bld: 150 mg/dL — ABNORMAL HIGH (ref 70–99)
Potassium: 4.1 mmol/L (ref 3.5–5.1)
Sodium: 139 mmol/L (ref 135–145)
Total Bilirubin: 1 mg/dL (ref 0.3–1.2)
Total Protein: 7 g/dL (ref 6.5–8.1)

## 2021-06-23 LAB — CBC
HCT: 37.8 % — ABNORMAL LOW (ref 39.0–52.0)
Hemoglobin: 13.1 g/dL (ref 13.0–17.0)
MCH: 32.6 pg (ref 26.0–34.0)
MCHC: 34.7 g/dL (ref 30.0–36.0)
MCV: 94 fL (ref 80.0–100.0)
Platelets: 142 10*3/uL — ABNORMAL LOW (ref 150–400)
RBC: 4.02 MIL/uL — ABNORMAL LOW (ref 4.22–5.81)
RDW: 12.8 % (ref 11.5–15.5)
WBC: 5.5 10*3/uL (ref 4.0–10.5)
nRBC: 0 % (ref 0.0–0.2)

## 2021-06-23 LAB — LIPASE, BLOOD: Lipase: 32 U/L (ref 11–51)

## 2021-06-23 NOTE — ED Provider Notes (Signed)
[facility] Emergency Department Provider Note  ____________________________________________  Time seen: Approximately 4:42 PM  I have reviewed the triage vital signs and the nursing notes.   HISTORY  Chief Complaint Hematuria    HPI James [name] is a 85 y.o. male who presents the emergency department for evaluation of hematuria.  Patient states that he is on anticoagulation, has had bouts with hematuria in the past.  He has seen urology, has been diagnosed with a large right-sided renal cyst as well as stones to both kidneys.  Patient has an 8 mm stone to both left and right kidney.  He denies any flank pain at this time but has developed hematuria.  He does endorse episodes of hematuria in the past but is never been this severe.  No dysuria.  No abdominal complaints.  Patient has dark stools at baseline but denies any changes.  He does take iron for same.  Patient is here for hematuria started today.       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   Nephrolithiasis 03/21/2020   Bradycardia, sinus 03/16/2019   Dizziness 03/16/2019   Essential hypertension 03/16/2019   Renal cyst 06/24/2018   Hiatal hernia 06/13/2018   Coronary atherosclerosis of autologous vein bypass graft 06/22/2017   Vitamin B12 deficiency 12/30/2016   Vitamin D deficiency 12/28/2016   Family history of malignant neoplasm of other organs or systems 12/08/2016   Obstructive sleep apnea (adult) (pediatric)  01/01/2016   Overweight 12/16/2015   Diverticulosis of colon 11/01/2015   Internal hemorrhoid 11/01/2015   Senile ectropion of left lower eyelid 06/28/2014   Cherry angioma 02/22/2014   Lentigo 02/22/2014   Actinic keratosis 02/22/2014   Not current smoker 05/23/2013   Personal history of other malignant neoplasm of skin 05/23/2013   Hardening of the aorta (main artery of the heart) ([place]) 07/17/2012   Anxiety 06/15/2012   COPD (chronic obstructive pulmonary disease) ([place]) 06/15/2012   Neck mass 10/17/2011   Anemia 08/06/2011   Closed fracture of part of upper end of humerus 08/03/2011   Hx of CABG 01/22/2011   Hyperlipidemia 01/21/2011   Osteopenia 05/01/2010   Dyspepsia 03/08/2010   Colon polyp 05/09/2009   Tobacco abuse counseling 11/11/2007    Past Surgical History:  Procedure Laterality Date   CARDIAC CATHETERIZATION     CORONARY ARTERY BYPASS GRAFT     FRACTURE SURGERY Left    LIPOMA EXCISION N/A 11/21/2018   Procedure: EXCISION BACK LIPOMA;  Surgeon: Jovita Kussmaul, MD;  Location: [facility];  Service: General;  Laterality: N/A;   PILONIDAL CYST EXCISION     SKIN CANCER EXCISION     TOTAL SHOULDER REPLACEMENT Left    UMBILICAL HERNIA REPAIR N/A 11/21/2018   Procedure: HERNIA REPAIR UMBILICAL ADULT WITH MESH;  Surgeon: Jovita Kussmaul, MD;  Location: [facility];  Service: General;  Laterality: N/A;  Prior to Admission medications   Medication Sig Start Date End Date Taking? Authorizing Provider  atorvastatin (LIPITOR) 40 MG tablet Take 1 tablet by mouth daily. 11/13/20   [provider]  BREWERS YEAST PO Take 1 Dose by mouth daily.    [provider]  carvedilol (COREG) 12.5 MG tablet Take 12.5 mg by mouth 2 (two) times daily.    [provider]  celecoxib (CELEBREX) 200 MG capsule Celecoxib 200 MG Oral Capsule QTY: 30 capsule Days: 30 Refills: 2  Written: 02/09/20 Patient Instructions: once a day 02/09/20   [provider]  Cholecalciferol  (VITAMIN D) 50 MCG (2000 UT) tablet Take 2,000 Units by mouth daily.    [provider]  citalopram (CELEXA) 20 MG tablet Take 20 mg by mouth daily.    [provider]  Coenzyme Q10 (COQ-10) 100 MG CAPS Take 100 mg by mouth daily.    [provider]  doxazosin (CARDURA) 1 MG tablet Take 1 tablet (1 mg total) by mouth at bedtime. 03/21/20   Billey Co, MD  enalapril (VASOTEC) 5 MG tablet Enalapril Maleate 5 MG Oral Tablet QTY: 90 tablet Days: 90 Refills: 2  Written: 02/09/20 Patient Instructions: Take 1 tablet by mouth ONCE daily. 02/09/20   [provider]  finasteride (PROSCAR) 5 MG tablet TAKE 1 TABLET BY MOUTH EVERY DAY 01/10/21   Billey Co, MD  lisinopril-hydrochlorothiazide (PRINZIDE,ZESTORETIC) 20-12.5 MG tablet Take 1 tablet by mouth daily.    [provider]  magnesium oxide (MAG-OX) 400 MG tablet Take by mouth.    [provider]  Naproxen Sod-diphenhydrAMINE 220-25 MG TABS Take 2 tablets by mouth at bedtime as needed (sleep).    [provider]  pantoprazole (PROTONIX) 40 MG tablet Take 40 mg by mouth daily as needed (acid reflux).     [provider]  Polypodium Leucotomos (HELIOCARE PO) Take by mouth.    [provider]  XARELTO 2.5 MG TABS tablet Take by mouth. 07/29/20   [provider]    Allergies Patient has no known allergies.  Family History  Problem Relation Age of Onset   Prostate cancer Neg Hx    Bladder Cancer Neg Hx    Kidney cancer Neg Hx     Social History Social History   Tobacco Use   Smoking status: Former    Packs/day: 1.00    Years: 20.00    Pack years: 20.00    Types: Cigarettes    Quit date: 09/21/2013    Years since quitting: 7.7   Smokeless tobacco: Never   Tobacco comments:    quit on and off over the years  Vaping Use   Vaping Use: Never used  Substance Use Topics   Alcohol use: Yes    Comment: socially   Drug use: No     Review of Systems   Constitutional: No fever/chills Eyes: No visual changes. No discharge ENT: No upper respiratory complaints. Cardiovascular: no chest pain. Respiratory: no cough. No SOB. Gastrointestinal: No abdominal pain.  No nausea, no vomiting.  No diarrhea.  No constipation. Genitourinary: Negative for dysuria.  Positive for gross hematuria Musculoskeletal: Negative for musculoskeletal pain. Skin: Negative for rash, abrasions, lacerations, ecchymosis. Neurological: Negative for headaches, focal weakness or numbness.  10 System ROS otherwise negative.  ____________________________________________   PHYSICAL EXAM:  VITAL SIGNS: ED Triage Vitals  Enc Vitals Group     BP 06/23/21 1438 (!) 158/99     Pulse Rate 06/23/21 1438  85     Resp 06/23/21 1438 18     Temp 06/23/21 1438 97.7 F (36.5 C)     Temp src --      SpO2 06/23/21 1438 96 %     Weight --      Height --      Head Circumference --      Peak Flow --      Pain Score 06/23/21 1437 2     Pain Loc --      Pain Edu? --      Excl. in [place]? --      Constitutional: Alert and oriented. Well appearing and in no acute distress. Eyes: Conjunctivae are normal. PERRL. EOMI. Head: Atraumatic. ENT:      Ears:       Nose: No congestion/rhinnorhea.      Mouth/Throat: Mucous membranes are moist.  Neck: No stridor.    Cardiovascular: Normal rate, regular rhythm. Normal S1 and S2.  Good peripheral circulation. Respiratory: Normal respiratory effort without tachypnea or retractions. Lungs CTAB. Good air entry to the bases with no decreased or absent breath sounds. Gastrointestinal: Bowel sounds 4 quadrants. Soft and nontender to palpation. No guarding or rigidity. No palpable masses. No distention. No CVA tenderness. Musculoskeletal: Full range of motion to all extremities. No gross deformities appreciated. Neurologic:  Normal speech and language. No gross focal neurologic deficits are appreciated.  Skin:  Skin is warm, dry and intact. No  rash noted. Psychiatric: Mood and affect are normal. Speech and behavior are normal. Patient exhibits appropriate insight and judgement.   ____________________________________________   LABS (all labs ordered are listed, but only abnormal results are displayed)  Labs Reviewed  COMPREHENSIVE METABOLIC PANEL - Abnormal; Notable for the following components:      Result Value   Glucose, Bld 150 (*)    All other components within normal limits  CBC - Abnormal; Notable for the following components:   RBC 4.02 (*)    HCT 37.8 (*)    Platelets 142 (*)    All other components within normal limits  URINALYSIS, COMPLETE (UACMP) WITH MICROSCOPIC - Abnormal; Notable for the following components:   Color, Urine RED (*)    APPearance TURBID (*)    Glucose, UA   (*)    Value: TEST NOT REPORTED DUE TO COLOR INTERFERENCE OF URINE PIGMENT   Hgb urine dipstick   (*)    Value: TEST NOT REPORTED DUE TO COLOR INTERFERENCE OF URINE PIGMENT   Bilirubin Urine   (*)    Value: TEST NOT REPORTED DUE TO COLOR INTERFERENCE OF URINE PIGMENT   Ketones, ur   (*)    Value: TEST NOT REPORTED DUE TO COLOR INTERFERENCE OF URINE PIGMENT   Protein, ur   (*)    Value: TEST NOT REPORTED DUE TO COLOR INTERFERENCE OF URINE PIGMENT   Nitrite   (*)    Value: TEST NOT REPORTED DUE TO COLOR INTERFERENCE OF URINE PIGMENT   Leukocytes,Ua   (*)    Value: TEST NOT REPORTED DUE TO COLOR INTERFERENCE OF URINE PIGMENT   RBC / HPF >50 (*)    WBC, UA >50 (*)    All other components within normal limits  LIPASE, BLOOD   ____________________________________________  EKG   ____________________________________________  RADIOLOGY I personally viewed and evaluated these images as part of my medical decision making, as well as reviewing the written report by the radiologist.  ED Provider Interpretation: Stable bilateral nephrolithiasis, renal cyst.  No other explanation for patient's symptoms  CT Renal Stone Study  Result  Date: 06/23/2021 CLINICAL DATA:  Hematuria. EXAM: CT ABDOMEN AND PELVIS WITHOUT CONTRAST TECHNIQUE: Multidetector CT imaging of the abdomen and pelvis was performed following the standard protocol without IV contrast. COMPARISON:  January 10, 2021 FINDINGS: Lower chest: Multiple sternal wires are seen. No acute abnormality is noted. Hepatobiliary: No focal liver abnormality is seen. No gallstones, gallbladder wall thickening, or biliary dilatation. Pancreas: Unremarkable. No pancreatic ductal dilatation or surrounding inflammatory changes. Spleen: Normal in size without focal abnormality. Adrenals/Urinary Tract: Adrenal glands are unremarkable. Kidneys are normal, without obstructing renal calculi or hydronephrosis. Multiple large parapelvic renal cysts are seen on the right. Small, stable simple left renal cysts are seen. 5 mm nonobstructing renal calculi seen within the right kidney. 3 mm and 6 mm nonobstructing renal calculi are noted within the left kidney. Bladder is unremarkable. Stomach/Bowel: Stomach is within normal limits. Appendix appears normal. No evidence of bowel wall thickening, distention, or inflammatory changes. Vascular/Lymphatic: Aortic atherosclerosis with approximately 3.4 cm diameter aneurysmal dilatation of the infrarenal abdominal aorta. No enlarged abdominal or pelvic lymph nodes. Reproductive: The prostate gland is mildly enlarged. Other: No abdominal wall hernia or abnormality. No abdominopelvic ascites. Musculoskeletal: Multilevel degenerative changes seen throughout the lumbar spine. IMPRESSION: 1. Bilateral nonobstructing renal calculi. 2. Multiple stable bilateral renal cysts. 3. Mildly enlarged prostate gland. 4. 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 Recommend follow-up ultrasound every 3 years. This recommendation follows ACR consensus guidelines: White Paper of the ACR Incidental Findings committee II on Vascular Findings. J Am  Coll Radiol 2013; 10:789-794. Aortic Atherosclerosis (ICD10-I70.0). Electronically Signed   By: Virgina Norfolk M.D.   On: 06/23/2021 19:15    ____________________________________________    PROCEDURES  Procedure(s) performed:    Procedures    Medications - No data to display   ____________________________________________   INITIAL IMPRESSION / ASSESSMENT AND PLAN / ED COURSE  Pertinent labs & imaging results that were available during my care of the patient were reviewed by me and considered in my medical decision making (see chart for details).  Review of the [place] CSRS was performed in accordance of the [place] prior to dispensing any controlled drugs.           Patient's diagnosis is consistent with gross hematuria.  Patient presented to the emergency department complaining of hematuria.  He states that he had gross hematuria earlier today and every time he urinates his urine is becoming cleaner.  Patient does have a history of intermittent hematuria and has seen urology for same in the past.  He had stable kidney stones to bilateral kidneys and a renal cyst on the right side.  No recent trauma to the back or abdomen.  He denies any pain at this time.  Patient did have hematuria on his urinalysis.  Labs and imaging are otherwise reassuring.  Hematuria is already improving without treatment.  Patient's primary concern is he is on anticoagulation.  I have cautioned the patient not to stop his anticoagulation.  At this point symptoms seem to be improving, there was no acute findings on imaging and he does have a history of same in the past.  Drink plenty of fluids and he already has a follow-up with his urologist in 2 days..  Return precautions discussed with the patient.  Patient is given ED precautions to return to the ED for any worsening or new symptoms.     ____________________________________________  FINAL CLINICAL IMPRESSION(S) / ED DIAGNOSES  Final diagnoses:  Gross  hematuria      NEW MEDICATIONS STARTED DURING THIS VISIT:  ED Discharge Orders     None           This chart was dictated using voice recognition software/Dragon. Despite best efforts to proofread, errors can occur which can change the meaning. Any change was purely unintentional.    Darletta Moll, PA-C 06/23/21 2319    Lucrezia Starch, MD 06/24/21 0000

## 2021-06-23 NOTE — ED Triage Notes (Signed)
Pt comes with c/o blood in urine and dark stools. Pt states this started today. Pt is on blood thinners. Pt states some little pain in belly.

## 2021-06-23 NOTE — ED Provider Notes (Signed)
Emergency Medicine Provider Triage Evaluation Note  DAISEAN BRODHEAD, a 85 y.o. male  was evaluated in triage.  Pt complains of hematuria as well as onset of dark stools this morning.  Patient notes that symptoms with urine started yesterday.  He is on a blood thinner for cardiac issue.  He describes just a mild amount of belly pain but otherwise denies any fevers, chills, sweats, chest pain, or shortness of breath.  He denies any bloody emesis or dizziness.   Review of Systems  Positive: Hematuria, melana Negative: CP, SOB  Physical Exam  BP (!) 158/99   Pulse 85   Temp 97.7 F (36.5 C)   Resp 18   SpO2 96%  Gen:   Awake, no distress  NAD Resp:  Normal effort CTA MSK:   Moves extremities without difficulty  Other:  ABD: soft, nontender  Medical Decision Making  Medically screening exam initiated at 2:40 PM.  Appropriate orders placed.  [name] was informed that the remainder of the evaluation will be completed by another provider, this initial triage assessment does not replace that evaluation, and the importance of remaining in the ED until their evaluation is complete.  Geriatric patient ED evaluation of hematuria and melena with blood thinner use.   Melvenia Needles, PA-C 06/23/21 1441    Blake Divine, MD 06/23/21 [phone]

## 2021-06-26 ENCOUNTER — Other Ambulatory Visit: Payer: Self-pay | Admitting: *Deleted

## 2021-06-26 ENCOUNTER — Encounter: Payer: Self-pay | Admitting: Urology

## 2021-06-26 ENCOUNTER — Other Ambulatory Visit: Payer: Self-pay

## 2021-06-26 ENCOUNTER — Ambulatory Visit: Payer: Medicare HMO | Admitting: Urology

## 2021-06-26 VITALS — Ht 69.0 in | Wt 175.0 lb

## 2021-06-26 DIAGNOSIS — N401 Enlarged prostate with lower urinary tract symptoms: Secondary | ICD-10-CM

## 2021-06-26 DIAGNOSIS — N200 Calculus of kidney: Secondary | ICD-10-CM | POA: Diagnosis not present

## 2021-06-26 DIAGNOSIS — N138 Other obstructive and reflux uropathy: Secondary | ICD-10-CM | POA: Diagnosis not present

## 2021-06-26 DIAGNOSIS — R310 Gross hematuria: Secondary | ICD-10-CM

## 2021-06-26 NOTE — Patient Instructions (Signed)

## 2021-06-26 NOTE — Progress Notes (Signed)
   06/26/2021 4:15 PM   James [name] 16-Feb-1936 [id number]  Reason for visit: Follow up gross hematuria, BPH, nephrolithiasis  HPI: 85 year old male on maximal medical therapy for BPH with finasteride and doxazosin, also has a history of kidney stones.  He previously had had some episodes of mild painless gross hematuria in March and April 2022 which ultimately prompted work-up with a CT urogram that showed no hydronephrosis or masses, but a nonobstructing right renal stone, and cystoscopy showed a moderate size prostate but bladder was otherwise grossly normal.  He is on Xarelto for atrial fibrillation.  He recently had another episode of significant painless gross hematuria that prompted a CT stone protocol on 06/23/2021.  I personally viewed and interpreted those films that show a 1 cm right renal stone without hydronephrosis, but no other new abnormalities.  We reviewed possible etiologies of his painless gross hematuria including intermittent obstruction of the right-sided stone, or prostatic in origin.  He is unsure if he would like to continue his anticoagulation, as he attributes this to the primary reason for severe gross hematuria.  He is planning to discuss with his cardiologist the risks and benefits in the setting of his atrial fibrillation.  If he opts to continue Xarelto and continues to have gross hematuria, we may need to consider HOLEP which would resolve both his urinary symptoms and potentially some of the bleeding, as well as consideration of addressing the right-sided renal stone, though he is not having any flank pain.  With his numerous comorbidities, we had a long conversation today about the risks and benefits, and I think he has a good understanding of his complex issues.  RTC 3 months Continue Flomax and finasteride   Billey Co, MD  [facility] [address] [place], [place] [address] [phone]

## 2021-07-03 DIAGNOSIS — E782 Mixed hyperlipidemia: Secondary | ICD-10-CM | POA: Diagnosis not present

## 2021-07-03 DIAGNOSIS — H6123 Impacted cerumen, bilateral: Secondary | ICD-10-CM | POA: Diagnosis not present

## 2021-07-03 DIAGNOSIS — I10 Essential (primary) hypertension: Secondary | ICD-10-CM | POA: Diagnosis not present

## 2021-07-03 DIAGNOSIS — I2510 Atherosclerotic heart disease of native coronary artery without angina pectoris: Secondary | ICD-10-CM | POA: Diagnosis not present

## 2021-07-03 DIAGNOSIS — J3089 Other allergic rhinitis: Secondary | ICD-10-CM | POA: Diagnosis not present

## 2021-07-10 DIAGNOSIS — H6123 Impacted cerumen, bilateral: Secondary | ICD-10-CM | POA: Diagnosis not present

## 2021-07-10 DIAGNOSIS — I10 Essential (primary) hypertension: Secondary | ICD-10-CM | POA: Diagnosis not present

## 2021-07-10 DIAGNOSIS — K219 Gastro-esophageal reflux disease without esophagitis: Secondary | ICD-10-CM | POA: Diagnosis not present

## 2021-07-10 DIAGNOSIS — R42 Dizziness and giddiness: Secondary | ICD-10-CM | POA: Diagnosis not present

## 2021-07-10 DIAGNOSIS — D513 Other dietary vitamin B12 deficiency anemia: Secondary | ICD-10-CM | POA: Diagnosis not present

## 2021-07-10 DIAGNOSIS — I2510 Atherosclerotic heart disease of native coronary artery without angina pectoris: Secondary | ICD-10-CM | POA: Diagnosis not present

## 2021-07-10 DIAGNOSIS — E782 Mixed hyperlipidemia: Secondary | ICD-10-CM | POA: Diagnosis not present

## 2021-07-10 DIAGNOSIS — Z23 Encounter for immunization: Secondary | ICD-10-CM | POA: Diagnosis not present

## 2021-07-31 ENCOUNTER — Ambulatory Visit: Payer: Medicare HMO | Admitting: Dermatology

## 2021-09-01 DIAGNOSIS — Z961 Presence of intraocular lens: Secondary | ICD-10-CM | POA: Diagnosis not present

## 2021-10-01 ENCOUNTER — Ambulatory Visit: Payer: Medicare HMO | Admitting: Urology

## 2021-10-06 DIAGNOSIS — I351 Nonrheumatic aortic (valve) insufficiency: Secondary | ICD-10-CM | POA: Diagnosis not present

## 2021-10-06 DIAGNOSIS — I2510 Atherosclerotic heart disease of native coronary artery without angina pectoris: Secondary | ICD-10-CM | POA: Diagnosis not present

## 2021-10-06 DIAGNOSIS — I10 Essential (primary) hypertension: Secondary | ICD-10-CM | POA: Diagnosis not present

## 2021-10-06 DIAGNOSIS — I340 Nonrheumatic mitral (valve) insufficiency: Secondary | ICD-10-CM | POA: Diagnosis not present

## 2021-10-06 DIAGNOSIS — E782 Mixed hyperlipidemia: Secondary | ICD-10-CM | POA: Diagnosis not present

## 2021-10-20 DIAGNOSIS — I340 Nonrheumatic mitral (valve) insufficiency: Secondary | ICD-10-CM | POA: Diagnosis not present

## 2021-10-20 DIAGNOSIS — I2510 Atherosclerotic heart disease of native coronary artery without angina pectoris: Secondary | ICD-10-CM | POA: Diagnosis not present

## 2021-10-20 DIAGNOSIS — I351 Nonrheumatic aortic (valve) insufficiency: Secondary | ICD-10-CM | POA: Diagnosis not present

## 2021-10-20 DIAGNOSIS — E782 Mixed hyperlipidemia: Secondary | ICD-10-CM | POA: Diagnosis not present

## 2021-10-20 DIAGNOSIS — I10 Essential (primary) hypertension: Secondary | ICD-10-CM | POA: Diagnosis not present

## 2021-10-22 ENCOUNTER — Encounter: Payer: Self-pay | Admitting: Urology

## 2021-10-22 ENCOUNTER — Ambulatory Visit: Payer: Medicare HMO | Admitting: Urology

## 2021-10-22 ENCOUNTER — Other Ambulatory Visit: Payer: Self-pay

## 2021-10-22 ENCOUNTER — Other Ambulatory Visit: Payer: Self-pay | Admitting: Urology

## 2021-10-22 VITALS — BP 185/77 | HR 59 | Ht 69.0 in | Wt 177.0 lb

## 2021-10-22 DIAGNOSIS — N138 Other obstructive and reflux uropathy: Secondary | ICD-10-CM

## 2021-10-22 DIAGNOSIS — R350 Frequency of micturition: Secondary | ICD-10-CM | POA: Diagnosis not present

## 2021-10-22 DIAGNOSIS — N200 Calculus of kidney: Secondary | ICD-10-CM | POA: Diagnosis not present

## 2021-10-22 DIAGNOSIS — N401 Enlarged prostate with lower urinary tract symptoms: Secondary | ICD-10-CM

## 2021-10-22 LAB — URINALYSIS, COMPLETE
Bilirubin, UA: NEGATIVE
Glucose, UA: NEGATIVE
Ketones, UA: NEGATIVE
Leukocytes,UA: NEGATIVE
Nitrite, UA: NEGATIVE
Protein,UA: NEGATIVE
Specific Gravity, UA: 1.015 (ref 1.005–1.030)
Urobilinogen, Ur: 0.2 mg/dL (ref 0.2–1.0)
pH, UA: 6.5 (ref 5.0–7.5)

## 2021-10-22 LAB — MICROSCOPIC EXAMINATION: Bacteria, UA: NONE SEEN

## 2021-10-22 LAB — BLADDER SCAN AMB NON-IMAGING

## 2021-10-22 NOTE — Patient Instructions (Signed)
Holmium Laser Enucleation of the Prostate (HoLEP)  HoLEP is a treatment for men with benign prostatic hyperplasia (BPH). The laser surgery removed blockages of urine flow, and is done without any incisions on the body.     What is HoLEP?  HoLEP is a type of laser surgery used to treat obstruction (blockage) of urine flow as a result of benign prostatic hyperplasia (BPH). In men with BPH, the prostate gland is not cancerous, but has become enlarged. An enlarged prostate can result in a number of urinary tract symptoms such as weak urinary stream, difficulty in starting urination, inability to urinate, frequent urination, or getting up at night to urinate.  HoLEP was developed in the 1990's as a more effective and less expensive surgical option for BPH, compared to other surgical options such as laser vaporization(PVP/greenlight laser), transurethral resection of the prostate(TURP), and open simple prostatectomy.   What happens during a HoLEP?  HoLEP requires general anesthesia (asleep throughout the procedure).   An antibiotic is given to reduce the risk of infection  A surgical instrument called a resectoscope is inserted through the urethra (the tube that carries urine from the bladder). The resectoscope has a camera that allows the surgeon to view the internal structure of the prostate gland, and to see where the incisions are being made during surgery.  The laser is inserted into the resectoscope and is used to enucleate (free up) the enlarged prostate tissue from the capsule (outer shell) and then to seal up any blood vessels. The tissue that has been removed is pushed back into the bladder.  A morcellator is placed through the resectoscope, and is used to suction out the prostate tissue that has been pushed into the bladder.  When the prostate tissue has been removed, the resectoscope is removed, and a foley catheter is placed to allow healing and drain the urine from the  bladder.     What happens after a HoLEP?  More than 90% of patients go home the same day a few hours after surgery. Less than 10% will be admitted to the hospital overnight for observation to monitor the urine, or if they have other medical problems.  Fluid is flushed through the catheter for about 1 hour after surgery to clear any blood from the urine. It is normal to have some blood in the urine after surgery. The need for blood transfusion is extremely rare.  Eating and drinking are permitted after the procedure once the patient has fully awakened from anesthesia.  The catheter is usually removed 2-3 days after surgery- the patient will come to clinic to have the catheter removed and make sure they can urinate on their own.  It is very important to drink lots of fluids after surgery for one week to keep the bladder flushed.  At first, there may be some burning with urination, but this typically improved within a few hours to days. Most patients do not have a significant amount of pain, and narcotic pain medications are rarely needed.  Symptoms of urinary frequency, urgency, and even leakage are NORMAL for the first few weeks after surgery as the bladder adjusts after having to work hard against blockage from the prostate for many years. This will improve, but can sometimes take several months.  The use of pelvic floor exercises (Kegel exercises) can help improve problems with urinary incontinence.   After catheter removal, patients will be seen at 6 weeks and 6 months for symptom check  No heavy lifting for  at least 2-3 weeks after surgery, however patients can walk and do light activities the first day after surgery. Return to work time depends on occupation.    What are the advantages of HoLEP?  HoLEP has been studied in many different parts of the world and has been shown to be a safe and effective procedure. Although there are many types of BPH surgeries available, HoLEP offers a  unique advantage in being able to remove a large amount of tissue without any incisions on the body, even in very large prostates, while decreasing the risk of bleeding and providing tissue for pathology (to look for cancer). This decreases the need for blood transfusions during surgery, minimizes hospital stay, and reduces the risk of needing repeat treatment.  What are the side effects of HoLEP?  Temporary burning and bleeding during urination. Some blood may be seen in the urine for weeks after surgery and is part of the healing process.  Urinary incontinence (inability to control urine flow) is expected in all patients immediately after surgery and they should wear pads for the first few days/weeks. This typically improves over the course of several weeks. Performing Kegel exercises can help decrease leakage from stress maneuvers such as coughing, sneezing, or lifting. The rate of long term leakage is very low. Patients may also have leakage with urgency and this may be treated with medication. The risk of urge incontinence can be dependent on several factors including age, prostate size, symptoms, and other medical problems.  Retrograde ejaculation or backwards ejaculation. In 75% of cases, the patient will not see any fluid during ejaculation after surgery.  Erectile function is generally not significantly affected.   What are the risks of HoLEP?  Injury to the urethra or development of scar tissue at a later date  Injury to the capsule of the prostate (typically treated with longer catheterization).  Injury to the bladder or ureteral orifices (where the urine from the kidney drains out)  Infection of the bladder, testes, or kidneys  Return of urinary obstruction at a later date requiring another operation (<2%)  Need for blood transfusion or re-operation due to bleeding  Failure to relieve all symptoms and/or need for prolonged catheterization after surgery  5-15% of patients are  found to have previously undiagnosed prostate cancer in their specimen. Prostate cancer can be treated after HoLEP.  Standard risks of anesthesia including blood clots, heart attacks, etc  When should I call my doctor?  Fever over 101.3 degrees  Inability to urinate, or large blood clots in the urine    Laser Therapy for Kidney Stones Laser therapy for kidney stones is a procedure to break up small, hard mineral deposits that form in the kidney (kidney stones). The procedure is done using a device that produces a focused beam of light (laser). The laser breaks up kidney stones into pieces that are small enough to be passed out of the body through urination or removed from the body during the procedure. You may need laser therapy if you have kidney stones that are painful or block your urinary tract. This procedure is done by inserting a tube (ureteroscope) into your kidney through the urethral opening. The urethra is the part of the body that drains urine from the bladder. In women, the urethra opens above the vaginal opening. In men, the urethra opens at the tip of the penis. The ureteroscope is inserted through the urethra, and surgical instruments are moved through the bladder and the muscular tube that  connects the kidney to the bladder (ureter) until they reach the kidney. Tell a health care provider about: Any allergies you have. All medicines you are taking, including vitamins, herbs, eye drops, creams, and over-the-counter medicines. Any problems you or family members have had with anesthetic medicines. Any blood disorders you have. Any surgeries you have had. Any medical conditions you have. Whether you are pregnant or may be pregnant. What are the risks? Generally, this is a safe procedure. However, problems may occur, including: Infection. Bleeding. Allergic reactions to medicines. Damage to the urethra, bladder, or ureter. Urinary tract infection (UTI). Narrowing of the  urethra (urethral stricture). Difficulty passing urine. Blockage of the kidney caused by a fragment of kidney stone. What happens before the procedure? Medicines Ask your health care provider about: Changing or stopping your regular medicines. This is especially important if you are taking diabetes medicines or blood thinners. Taking medicines such as aspirin and ibuprofen. These medicines can thin your blood. Do not take these medicines unless your health care provider tells you to take them. Taking over-the-counter medicines, vitamins, herbs, and supplements. Eating and drinking Follow instructions from your health care provider about eating and drinking, which may include: 8 hours before the procedure - stop eating heavy meals or foods, such as meat, fried foods, or fatty foods. 6 hours before the procedure - stop eating light meals or foods, such as toast or cereal. 6 hours before the procedure - stop drinking milk or drinks that contain milk. 2 hours before the procedure - stop drinking clear liquids. Staying hydrated Follow instructions from your health care provider about hydration, which may include: Up to 2 hours before the procedure - you may continue to drink clear liquids, such as water, clear fruit juice, black coffee, and plain tea.  General instructions You may have a physical exam before the procedure. You may also have tests, such as imaging tests and blood or urine tests. If your ureter is too narrow, your health care provider may place a soft, flexible tube (stent) inside of it. The stent may be placed days or weeks before your laser therapy procedure. Plan to have someone take you home from the hospital or clinic. If you will be going home right after the procedure, plan to have someone stay with you for 24 hours. Do not use any products that contain nicotine or tobacco for at least 4 weeks before the procedure. These products include cigarettes, e-cigarettes, and chewing  tobacco. If you need help quitting, ask your health care provider. Ask your health care provider: How your surgical site will be marked or identified. What steps will be taken to help prevent infection. These may include: Removing hair at the surgery site. Washing skin with a germ-killing soap. Taking antibiotic medicine. What happens during the procedure?  An IV will be inserted into one of your veins. You will be given one or more of the following: A medicine to help you relax (sedative). A medicine to numb the area (local anesthetic). A medicine to make you fall asleep (general anesthetic). A ureteroscope will be inserted into your urethra. The ureteroscope will send images to a video screen in the operating room to guide your surgeon to the area of your kidney that will be treated. A small, flexible tube will be threaded through the ureteroscope and into your bladder and ureter, up to your kidney. The laser device will be inserted into your kidney through the tube. Your surgeon will pulse the laser on  and off to break up kidney stones. A surgical instrument that has a tiny wire basket may be inserted through the tube into your kidney to remove the pieces of broken kidney stone. The procedure may vary among health care providers and hospitals. What happens after the procedure? Your blood pressure, heart rate, breathing rate, and blood oxygen level will be monitored until you leave the hospital or clinic. You will be given pain medicine as needed. You may continue to receive antibiotics. You may have a stent temporarily placed in your ureter. Do not drive for 24 hours if you were given a sedative during your procedure. You may be given a strainer to collect any stone fragments that you pass in your urine. Your health care provider may have these tested. Summary Laser therapy for kidney stones is a procedure to break up kidney stones into pieces that are small enough to be passed out of  the body through urination or removed during the procedure. Follow instructions from your health care provider about eating and drinking before the procedure. During the procedure, the ureteroscope will send images to a video screen to guide your surgeon to the area of your kidney that will be treated. Do not drive for 24 hours if you were given a sedative during your procedure. This information is not intended to replace advice given to you by your health care provider. Make sure you discuss any questions you have with your health care provider. Document Revised: 05/12/2021 Document Reviewed: 05/12/2021 Elsevier Patient Education  [place].

## 2021-10-22 NOTE — Progress Notes (Signed)
Surgical Physician Order Form [facility]  * Scheduling expectation : Next Available  *Length of Case: 2 hours  *Clearance needed: no  *Anticoagulation Instructions: Hold all anticoagulants  *Aspirin Instructions: Hold Aspirin and Plavix  *Post-op visit Date/Instructions:  2 day Foley removal PA/nurse, 12 weeks MD PVR  *Diagnosis: BPH with obstruction, right renal stone  *Procedure: right Ureteroscopy w/laser lithotripsy & stent placement (91916), and HOLEP   Additional orders: N/A  -Admit type: OUTpatient  -Anesthesia: General  -VTE Prophylaxis Standing Order SCDs       Other:   -Standing Lab Orders Per Anesthesia    Lab other: None  -Standing Test orders EKG/Chest x-ray per Anesthesia       Test other:   - Medications:  Ancef 2gm IV  -Other orders:  N/A

## 2021-10-22 NOTE — Progress Notes (Signed)
° °  10/22/2021 11:32 AM   James [name] 1936-01-30 [id number]  Reason for visit: Follow up BPH/OAB, gross hematuria, right-sided nephrolithiasis  HPI: 86 year old male on maximal medical therapy for BPH with finasteride and doxazosin, and also has a history of kidney stones.  He has had problems with intermittent gross hematuria, as well as right-sided flank pain over the last 6 to 12 months.  CT in April and October 2022 showed a 1 cm right renal pelvis stone likely causing some intermittent ball valving obstruction, and prostate measures 43 g.  He has continued to have these issues, with 2 episodes of gross hematuria, as well as right-sided pain in the 2 months.  The hematuria is not associated with the pain, so unclear if gross hematuria related to BPH or the right renal stone.  He had a cystoscopy in April 2022 that showed a moderate size prostate with small median lobe, no suspicious lesions.  He continues to be very bothered by the intermittent gross hematuria, right flank pain, as well as his urinary symptoms of weak stream, urgency, and frequency.  Urinalysis today with 11-30 RBCs, but otherwise completely benign, PVR today is normal at 35 mL.  We discussed options at length moving forward including trial of an OAB medication, right ureteroscopy/laser/stent for his right renal stone causing likely intermittent obstruction and pain, as well as consideration of an outlet procedure like HOLEP that would likely address both his gross hematuria from BPH as well as his urinary symptoms, and would be able to get off of his 2 medications for the prostate.  We specifically discussed the risks ureteroscopy including bleeding, infection/sepsis, stent related symptoms including flank pain/urgency/frequency/incontinence/dysuria, ureteral injury, inability to access stone, or need for staged or additional procedures. We discussed the risks and benefits of HoLEP at length.  The procedure requires general  anesthesia and takes 1 to 2 hours, and a holmium laser is used to enucleate the prostate and push this tissue into the bladder.  A morcellator is then used to remove this tissue, which is sent for pathology.  The vast majority(>95%) of patients are able to discharge the same day with a catheter in place for 2 to 3 days, and will follow-up in clinic for a voiding trial.  We specifically discussed the risks of bleeding, infection, retrograde ejaculation, temporary urgency and urge incontinence, very low risk of long-term incontinence, urethral stricture/bladder neck contracture, pathologic evaluation of prostate tissue and possible detection of prostate cancer or other malignancy, and possible need for additional procedures.  Extensive patient information was provided today regarding ureteroscopy and HOLEP, and he is leaning toward moving this way.  He also is on Xarelto from Dr. Chancy Milroy which will need to be held perioperatively.  Will call to schedule right ureteroscopy/laser/stent and simultaneous [place], MD  [facility] [address], [place], [place] [address] [phone]

## 2021-10-23 ENCOUNTER — Ambulatory Visit: Payer: Medicare HMO | Admitting: Dermatology

## 2021-10-23 DIAGNOSIS — L570 Actinic keratosis: Secondary | ICD-10-CM

## 2021-10-23 DIAGNOSIS — R238 Other skin changes: Secondary | ICD-10-CM

## 2021-10-23 DIAGNOSIS — L821 Other seborrheic keratosis: Secondary | ICD-10-CM

## 2021-10-23 DIAGNOSIS — L578 Other skin changes due to chronic exposure to nonionizing radiation: Secondary | ICD-10-CM | POA: Diagnosis not present

## 2021-10-23 DIAGNOSIS — L820 Inflamed seborrheic keratosis: Secondary | ICD-10-CM | POA: Diagnosis not present

## 2021-10-23 NOTE — Progress Notes (Signed)
Follow-Up Visit   Subjective  James [name] is a 86 y.o. male who presents for the following: Actinic Keratosis (7 months f/u hx of aks ). The patient has spots, moles and lesions to be evaluated, some may be new or changing and the patient has concerns that these could be cancer.   The following portions of the chart were reviewed this encounter and updated as appropriate:   Tobacco   Allergies   Meds   Problems   Med Hx   Surg Hx   Fam Hx      Review of Systems:  No other skin or systemic complaints except as noted in HPI or Assessment and Plan.  Objective  Well appearing patient in no apparent distress; mood and affect are within normal limits.  A focused examination was performed including face,scalp,legs. Relevant physical exam findings are noted in the Assessment and Plan.  forehead, scalp x 17 (17) Erythematous thin papules/macules with gritty scale.   face x 18 (18) Stuck-on, waxy, tan-brown papules  Right Ear Venous lake    Assessment & Plan  AK (actinic keratosis) (17) forehead, scalp x 17  Actinic keratoses are precancerous spots that appear secondary to cumulative UV radiation exposure/sun exposure over time. They are chronic with expected duration over 1 year. A portion of actinic keratoses will progress to squamous cell carcinoma of the skin. It is not possible to reliably predict which spots will progress to skin cancer and so treatment is recommended to prevent development of skin cancer.  Recommend daily broad spectrum sunscreen SPF 30+ to sun-exposed areas, reapply every 2 hours as needed.  Recommend staying in the shade or wearing long sleeves, sun glasses (UVA+UVB protection) and wide brim hats (4-inch brim around the entire circumference of the hat). Call for new or changing lesions.   Destruction of lesion - forehead, scalp x 17 Complexity: simple   Destruction method: cryotherapy   Informed consent: discussed and consent obtained   Timeout:   patient name, date of birth, surgical site, and procedure verified Lesion destroyed using liquid nitrogen: Yes   Region frozen until ice ball extended beyond lesion: Yes   Outcome: patient tolerated procedure well with no complications   Post-procedure details: wound care instructions given    Inflamed seborrheic keratosis (18) face x 18  Reassured benign age-related growth.  Recommend observation.  Discussed cryotherapy if spot(s) become irritated or inflamed.   Destruction of lesion - face x 18 Complexity: simple   Destruction method: cryotherapy   Informed consent: discussed and consent obtained   Timeout:  patient name, date of birth, surgical site, and procedure verified Lesion destroyed using liquid nitrogen: Yes   Region frozen until ice ball extended beyond lesion: Yes   Outcome: patient tolerated procedure well with no complications   Post-procedure details: wound care instructions given    Venous lake Right Ear  Benign-appearing.  Observation.  Call clinic for new or changing moles.  Recommend daily use of broad spectrum spf 30+ sunscreen to sun-exposed areas.    Seborrheic Keratoses - Stuck-on, waxy, tan-brown papules and/or plaques  - Benign-appearing - Discussed benign etiology and prognosis. - Observe - Call for any changes  Actinic Damage - chronic, secondary to cumulative UV radiation exposure/sun exposure over time - diffuse scaly erythematous macules with underlying dyspigmentation - Recommend daily broad spectrum sunscreen SPF 30+ to sun-exposed areas, reapply every 2 hours as needed.  - Recommend staying in the shade or wearing long sleeves, sun glasses (UVA+UVB  protection) and wide brim hats (4-inch brim around the entire circumference of the hat). - Call for new or changing lesions.   Return in about 6 months (around 04/22/2022) for Aks, ISKs.  IMarye Round, CMA, am acting as scribe for Sarina Ser, MD .  Documentation: I have reviewed the above  documentation for accuracy and completeness, and I agree with the above.  Sarina Ser, MD

## 2021-10-23 NOTE — Patient Instructions (Addendum)
Prior to procedure, discussed risks of blister formation, small wound, skin dyspigmentation, or rare scar following cryotherapy. Recommend Vaseline ointment to treated areas while healing.     If You Need Anything After Your Visit  If you have any questions or concerns for your doctor, please call our main line at [phone] and press option 4 to reach your doctor's medical assistant. If no one answers, please leave a voicemail as directed and we will return your call as soon as possible. Messages left after 4 pm will be answered the following business day.   You may also send us a message via MyChart. We typically respond to MyChart messages within 1-2 business days.  For prescription refills, please ask your pharmacy to contact our office. Our fax number is [phone].  If you have an urgent issue when the clinic is closed that cannot wait until the next business day, you can page your doctor at the number below.    Please note that while we do our best to be available for urgent issues outside of office hours, we are not available 24/7.   If you have an urgent issue and are unable to reach us, you may choose to seek medical care at your doctor's office, retail clinic, urgent care center, or emergency room.  If you have a medical emergency, please immediately call 911 or go to the emergency department.  Pager Numbers  - Dr. Kowalski: [phone]  - Dr. Moye: [phone]  - Dr. Stewart: [phone]  In the event of inclement weather, please call our main line at [phone] for an update on the status of any delays or closures.  Dermatology Medication Tips: Please keep the boxes that topical medications come in in order to help keep track of the instructions about where and how to use these. Pharmacies typically print the medication instructions only on the boxes and not directly on the medication tubes.   If your medication is too expensive, please contact our office at  [phone] option 4 or send us a message through MyChart.   We are unable to tell what your co-pay for medications will be in advance as this is different depending on your insurance coverage. However, we may be able to find a substitute medication at lower cost or fill out paperwork to get insurance to cover a needed medication.   If a prior authorization is required to get your medication covered by your insurance company, please allow us 1-2 business days to complete this process.  Drug prices often vary depending on where the prescription is filled and some pharmacies may offer cheaper prices.  The website www.goodrx.com contains coupons for medications through different pharmacies. The prices here do not account for what the cost may be with help from insurance (it may be cheaper with your insurance), but the website can give you the price if you did not use any insurance.  - You can print the associated coupon and take it with your prescription to the pharmacy.  - You may also stop by our office during regular business hours and pick up a GoodRx coupon card.  - If you need your prescription sent electronically to a different pharmacy, notify our office through [facility] MyChart or by phone at [phone] option 4.     Si Usted Necesita Algo Despus de Su Visita  Tambin puede enviarnos un mensaje a travs de MyChart. Por lo general respondemos a los mensajes de MyChart en el transcurso de 1 a 2 das   hbiles.  Para renovar recetas, por favor pida a su farmacia que se ponga en contacto con nuestra oficina. Nuestro nmero de fax es el [phone].  Si tiene un asunto urgente cuando la clnica est cerrada y que no puede esperar hasta el siguiente da hbil, puede llamar/localizar a su doctor(a) al nmero que aparece a continuacin.   Por favor, tenga en cuenta que aunque hacemos todo lo posible para estar disponibles para asuntos urgentes fuera del horario de oficina, no estamos  disponibles las 24 horas del da, los 7 das de la semana.   Si tiene un problema urgente y no puede comunicarse con nosotros, puede optar por buscar atencin mdica  en el consultorio de su doctor(a), en una clnica privada, en un centro de atencin urgente o en una sala de emergencias.  Si tiene una emergencia mdica, por favor llame inmediatamente al 911 o vaya a la sala de emergencias.  Nmeros de bper  - Dr. Kowalski: [phone]  - Dra. Moye: [phone]  - Dra. Stewart: [phone]  En caso de inclemencias del tiempo, por favor llame a nuestra lnea principal al [phone] para una actualizacin sobre el estado de cualquier retraso o cierre.  Consejos para la medicacin en dermatologa: Por favor, guarde las cajas en las que vienen los medicamentos de uso tpico para ayudarle a seguir las instrucciones sobre dnde y cmo usarlos. Las farmacias generalmente imprimen las instrucciones del medicamento slo en las cajas y no directamente en los tubos del medicamento.   Si su medicamento es muy caro, por favor, pngase en contacto con nuestra oficina llamando al [phone] y presione la opcin 4 o envenos un mensaje a travs de MyChart.   No podemos decirle cul ser su copago por los medicamentos por adelantado ya que esto es diferente dependiendo de la cobertura de su seguro. Sin embargo, es posible que podamos encontrar un medicamento sustituto a menor costo o llenar un formulario para que el seguro cubra el medicamento que se considera necesario.   Si se requiere una autorizacin previa para que su compaa de seguros cubra su medicamento, por favor permtanos de 1 a 2 das hbiles para completar este proceso.  Los precios de los medicamentos varan con frecuencia dependiendo del lugar de dnde se surte la receta y alguna farmacias pueden ofrecer precios ms baratos.  El sitio web www.goodrx.com tiene cupones para medicamentos de diferentes farmacias. Los precios aqu no  tienen en cuenta lo que podra costar con la ayuda del seguro (puede ser ms barato con su seguro), pero el sitio web puede darle el precio si no utiliz ningn seguro.  - Puede imprimir el cupn correspondiente y llevarlo con su receta a la farmacia.  - Tambin puede pasar por nuestra oficina durante el horario de atencin regular y recoger una tarjeta de cupones de GoodRx.  - Si necesita que su receta se enve electrnicamente a una farmacia diferente, informe a nuestra oficina a travs de MyChart de [place] o por telfono llamando al [phone] y presione la opcin 4.  

## 2021-10-24 DIAGNOSIS — E782 Mixed hyperlipidemia: Secondary | ICD-10-CM | POA: Diagnosis not present

## 2021-10-24 DIAGNOSIS — I10 Essential (primary) hypertension: Secondary | ICD-10-CM | POA: Diagnosis not present

## 2021-10-24 DIAGNOSIS — G3184 Mild cognitive impairment, so stated: Secondary | ICD-10-CM | POA: Diagnosis not present

## 2021-10-24 DIAGNOSIS — R5383 Other fatigue: Secondary | ICD-10-CM | POA: Diagnosis not present

## 2021-10-24 DIAGNOSIS — I2510 Atherosclerotic heart disease of native coronary artery without angina pectoris: Secondary | ICD-10-CM | POA: Diagnosis not present

## 2021-10-24 DIAGNOSIS — E538 Deficiency of other specified B group vitamins: Secondary | ICD-10-CM | POA: Diagnosis not present

## 2021-10-24 DIAGNOSIS — N400 Enlarged prostate without lower urinary tract symptoms: Secondary | ICD-10-CM | POA: Diagnosis not present

## 2021-10-24 LAB — CULTURE, URINE COMPREHENSIVE

## 2021-10-26 ENCOUNTER — Encounter: Payer: Self-pay | Admitting: Dermatology

## 2021-10-29 ENCOUNTER — Telehealth: Payer: Self-pay

## 2021-10-29 NOTE — Progress Notes (Signed)
[facility] Posting Form   Surgery Date/Time: Date: 11/07/2021  Surgeon: Dr. Nickolas Madrid, MD  Surgery Location: Day Surgery  Inpt ( No  )   Outpt (Yes)   Obs ( No  )   Diagnosis: N20.0 Right Renal Stone; N40.1 N13.8 BPH with Urinary Obstruction  -CPT: 45146,04799  Surgery: Holmium Laser Enucleation of the Prostate, Right Ureteroscopy with laser lithotripsy and stent placement  Stop Anticoagulations: Yes, also hold Aspirin  Cardiac/Medical/Pulmonary Clearance needed: no  *Orders entered into EPIC  Date: 10/29/21   *Case booked in EPIC  Date: 10/29/21  *Notified pt of Surgery: Date: 10/29/21  *Placed into Prior Authorization Work Fabio Bering Date: 10/29/21   Assistant/laser/rep:No

## 2021-10-29 NOTE — Telephone Encounter (Signed)
I spoke with James [name]. We have discussed possible surgery dates and Friday February 17th, 2023 was agreed upon by all parties. Patient given information about surgery date, what to expect pre-operatively and post operatively.  We discussed that a Pre-Admission Testing office will be calling to set up the pre-op visit that will take place prior to surgery, and that these appointments are typically done over the phone with a Pre-Admissions RN. Informed patient that our office will communicate any additional care to be provided after surgery. Patients questions or concerns were discussed during our call. Advised to call our office should there be any additional information, questions or concerns that arise. Patient verbalized understanding.

## 2021-10-30 ENCOUNTER — Telehealth: Payer: Self-pay | Admitting: Urgent Care

## 2021-10-30 NOTE — Progress Notes (Signed)
°  Perioperative Services Pre-Admission/Anesthesia Testing     Date: 10/30/21  Name: James [name] MRN:   [id number]  Re: Surgical clearance  Surgical clearance received from Dr. April Manson office (cardiology). Patient has been cleared for the planned [place] MORCELLATION; CYSTOSCOPY/URETEROSCOPY/HOLMIUM LASER/STENT PLACEMENT scheduled for 11/07/2021 with Dr. Nickolas Madrid.  Cardiology notes that patient may proceed with an overall MODERATE risk stratification. He has been cleared to hold his daily RIVAROXABAN dose for 3 days prior to his procedure (last dose 11/03/2021) with plans to restart as soon as postoperative bleeding risk felt to be minimized by Dr. Diamantina Providence. Copy of signed clearance form placed on patient's OR chart for review by the surgical/anesthetic team on the day of his procedure.   Honor Loh, MSN, APRN, FNP-C, CEN [facility]  Peri-operative Services Nurse Practitioner Phone: [phone] 10/30/21 8:40 AM

## 2021-10-31 DIAGNOSIS — I2510 Atherosclerotic heart disease of native coronary artery without angina pectoris: Secondary | ICD-10-CM | POA: Diagnosis not present

## 2021-10-31 DIAGNOSIS — E782 Mixed hyperlipidemia: Secondary | ICD-10-CM | POA: Diagnosis not present

## 2021-10-31 DIAGNOSIS — E538 Deficiency of other specified B group vitamins: Secondary | ICD-10-CM | POA: Diagnosis not present

## 2021-10-31 DIAGNOSIS — I10 Essential (primary) hypertension: Secondary | ICD-10-CM | POA: Diagnosis not present

## 2021-10-31 DIAGNOSIS — R5383 Other fatigue: Secondary | ICD-10-CM | POA: Diagnosis not present

## 2021-11-05 ENCOUNTER — Other Ambulatory Visit
Admission: RE | Admit: 2021-11-05 | Discharge: 2021-11-05 | Disposition: A | Discharge status: Home / Self Care | Payer: Medicare HMO | Source: Ambulatory Visit | Attending: Urology | Admitting: Urology

## 2021-11-05 ENCOUNTER — Other Ambulatory Visit: Payer: Self-pay

## 2021-11-05 ENCOUNTER — Encounter: Payer: Self-pay | Admitting: Urology

## 2021-11-05 DIAGNOSIS — Z01812 Encounter for preprocedural laboratory examination: Secondary | ICD-10-CM

## 2021-11-05 HISTORY — DX: Anxiety disorder, unspecified: F41.9

## 2021-11-05 MED ORDER — PHENYLEPHRINE HCL (PRESSORS) 10 MG/ML IV SOLN
INTRAVENOUS | Status: AC
  Filled 2021-11-05: qty 1

## 2021-11-05 MED ORDER — MIDAZOLAM HCL 2 MG/2ML IJ SOLN
INTRAMUSCULAR | Status: AC
  Filled 2021-11-05: qty 2

## 2021-11-05 MED ORDER — FENTANYL CITRATE (PF) 100 MCG/2ML IJ SOLN
INTRAMUSCULAR | Status: AC
  Filled 2021-11-05: qty 2

## 2021-11-05 MED ORDER — PROPOFOL 1000 MG/100ML IV EMUL
INTRAVENOUS | Status: AC
  Filled 2021-11-05: qty 100

## 2021-11-05 NOTE — Progress Notes (Signed)
Perioperative Services  Pre-Admission/Anesthesia Testing Clinical Review  Date: 11/06/21  Patient Demographics:  Name: James [name] DOB:   Aug 16, 1936 MRN:   [id number]  Planned Surgical Procedure(s):    Case: [id number] Date/Time: 11/07/21 1031   Procedures:      HOLEP-LASER ENUCLEATION OF THE PROSTATE WITH MORCELLATION     CYSTOSCOPY/URETEROSCOPY/HOLMIUM LASER/STENT PLACEMENT (Right)   Anesthesia type: General   Pre-op diagnosis: Right Renal Stone, Benign Prostatic Hyperplasia with Obstruction   Location: [facility] OR ROOM 10 / [facility]   Surgeons: Billey Co, MD   NOTE: Available PAT nursing documentation and vital signs have been reviewed. Clinical nursing staff has updated patient's PMH/PSHx, current medication list, and drug allergies/intolerances to ensure comprehensive history available to assist in medical decision making as it pertains to the aforementioned surgical procedure and anticipated anesthetic course. Extensive review of available clinical information performed. [place] PMH and PSHx updated with any diagnoses/procedures that  may have been inadvertently omitted during his intake with the pre-admission testing department's nursing staff.  Clinical Discussion:  James [name] is a 86 y.o. male who is submitted for pre-surgical anesthesia review and clearance prior to him undergoing the above procedure. Patient is a Former Smoker (20 pack years; quit 09/2013). Pertinent PMH includes: CAD (s/p CABG), bradycardia, Mobitz 1 AV block, aortic atherosclerosis, aortic dilatation,  HTN, HLD, CKD, COPD, OSA (does not require nocturnal PAP therapy), GERD (on daily PPI), hiatal hernia, OA, BPH, nephrolithiasis, anxiety.  Patient is followed by cardiology Humphrey Rolls, MD). He was last seen in the cardiology clinic on 10/20/2021; notes reviewed.  At the time of his clinic visit, patient doing well overall from a cardiovascular perspective. He denied any episodes  of chest pain, shortness of breath, PND, orthopnea, palpitations, significant peripheral edema, vertiginous symptoms, or presyncope/syncope. Past medical history significant for cardiovascular diagnoses. Of note, details regarding remote cardiovascular care unavailable at time of consult. Patient received case while living in [place]. Available history in Care Everywhere and from primary local cardiologist reviewed.   Patient underwent a 2 vessel CABG procedure in 2001 while living in [place]. Bypassed vessels unknown.   Last TTE was performed and 06/2020 revealing a normal left ventricular systolic function with an EF of 60%.  Left atrium was severely dilated, right atrium was mildly dilated, and aorta was normal in size.  Diastolic Doppler parameters consistent with impaired relaxation (G1DD).  There was trace pulmonary regurgitation and mild to moderate tricuspid, mitral, and aortic valve regurgitation.  There was mild pulmonary hypertension.  There was no evidence of a significant transvalvular gradient to suggest valvular stenosis.    Patient remains on daily anticoagulation therapy using rivaroxaban; compliant with therapy with no evidence of GI bleeding.  Blood pressure mildly elevated at 144/86 on currently prescribed alpha blocker and ACEi therapies. He is on a statin for his HLD. He is not diabetic. Patient with an OSHA diagnosis; he does not require the use of nocturnal PAP therapy per his report. Functional capacity, as defined by DASI, is documented as being >/= 4 METS. No changes were made to his medication regimen.  Patient to follow-up with outpatient cardiology in 1 to 2 months or sooner if needed.  James [name] is scheduled for an CYSTOSCOPY/URETEROSCOPY/HOLMIUM LASER/STENT PLACEMENT; HOLEP-LASER ENUCLEATION OF THE PROSTATE WITH MORCELLATION on 11/07/2021 with Dr. Nickolas Madrid, MD. Given patient's past medical history significant for cardiovascular diagnoses,  presurgical cardiac clearance was sought by the PAT team. Per cardiology, "this patient  is optimized for surgery and may proceed with the planned procedural course with a MODERATE risk of significant perioperative cardiovascular complications".  Again, this patient is on daily rivaroxaban therapy.  He has been instructed on recommendations from his cardiologist for holding this medication for 3 days prior to his procedure with plans to restart as soon as postoperative bleeding risk felt to be minimized by his primary attending surgeon.  Patient is aware that his last dose of rivaroxaban will be on 11/03/2021.  Patient denies previous perioperative complications with anesthesia in the past. In review of the available records, it is noted that patient underwent a general anesthetic course at [facility] in 11/2018 without documented complications.   Vitals with BMI 10/22/2021 06/26/2021 06/23/2021  Height 5\' 9"  5\' 9"  -  Weight 177 lbs 175 lbs -  BMI 56.43 32.95 -  Systolic 188 - 416  Diastolic 77 - 86  Pulse 59 - 86    Providers/Specialists:   NOTE: Primary physician provider listed below. Patient may have been seen by APP or partner within same practice.   PROVIDER ROLE / SPECIALTY LAST Ranae Pila, MD Urology (Surgeon) 10/22/2021  Perrin Maltese, MD Primary Care Provider ???  Neoma Laming, MD Cardiology  10/20/2021   Allergies:  Patient has no known allergies.  Current Home Medications:   No current facility-administered medications for this encounter.    atorvastatin (LIPITOR) 40 MG tablet   Coenzyme Q10 (CO Q-10 MAXIMUM STRENGTH PO)   donepezil (ARICEPT) 10 MG tablet   doxazosin (CARDURA) 1 MG tablet   enalapril (VASOTEC) 20 MG tablet   Ferrous Sulfate Dried (SLOW RELEASE IRON) 45 MG TBCR   finasteride (PROSCAR) 5 MG tablet   fluticasone (FLONASE) 50 MCG/ACT nasal spray   pantoprazole (PROTONIX) 40 MG tablet   vitamin B-12 (CYANOCOBALAMIN) 1000 MCG  tablet   XARELTO 15 MG TABS tablet   atorvastatin (LIPITOR) 40 MG tablet   History:   Past Medical History:  Diagnosis Date   Anxiety    Aortic atherosclerosis (HCC)    Aortic dilatation ([place]) 01/10/2021   a.) CT 01/10/2021: infrarenal aortic dilitation measuring 3.3 x 3.3 cm. b.) CT 06/23/2021: measured 3.4 cm.   Arthritis    B12 deficiency    Basal cell carcinoma 03/14/2019   R nasal tip    Bilateral renal cysts    BPH (benign prostatic hyperplasia)    Bradycardia    Chronic kidney disease    COPD (chronic obstructive pulmonary disease) (HCC)    Coronary artery disease    a.) s/p 2v CABG in 2001; performed while living in [place].   Diastolic dysfunction 60/6301   a.) TTE 06/2020: EF 60%; sev LA dilation, mild RA dilation; triv PR, mild to mod TR/MR/AR; G1DD.   Frequency of urination    GERD (gastroesophageal reflux disease)    Hiatal hernia    History of kidney stones    HLD (hyperlipidemia)    Hypertension    Internal hemorrhoids    Long term current use of anticoagulant    a.) Rivaroxaban   Mobitz type 1 second degree AV block    OSA (obstructive sleep apnea)    a.) does not require nocturnal PAP therapy   Osteopenia    Pulmonary HTN ([place]) 06/2020   a.) mild   S/P CABG x 2 2001   a.) details unknown; performed in 2001 in [place].   Past Surgical History:  Procedure Laterality Date  CARDIAC CATHETERIZATION     COLONOSCOPY     CORONARY ARTERY BYPASS GRAFT     FRACTURE SURGERY Left    fractured left arm   LIPOMA EXCISION N/A 11/21/2018   Procedure: EXCISION BACK LIPOMA;  Surgeon: Jovita Kussmaul, MD;  Location: [place];  Service: General;  Laterality: N/A;   PILONIDAL CYST EXCISION     SKIN CANCER EXCISION     TOTAL SHOULDER REPLACEMENT Left    UMBILICAL HERNIA REPAIR N/A 11/21/2018   Procedure: HERNIA REPAIR UMBILICAL ADULT WITH MESH;  Surgeon: Jovita Kussmaul, MD;  Location: [facility] OR;  Service: General;  Laterality: N/A;   Family History   Problem Relation Age of Onset   Prostate cancer Neg Hx    Bladder Cancer Neg Hx    Kidney cancer Neg Hx    Social History   Tobacco Use   Smoking status: Former    Packs/day: 1.00    Years: 20.00    Pack years: 20.00    Types: Cigarettes    Quit date: 09/21/2013    Years since quitting: 8.1   Smokeless tobacco: Never   Tobacco comments:    quit on and off over the years  Vaping Use   Vaping Use: Never used  Substance Use Topics   Alcohol use: Yes    Comment: socially   Drug use: No    Pertinent Clinical Results:  LABS: Labs reviewed: Acceptable for surgery.  Hospital Outpatient Visit on 11/06/2021  Component Date Value Ref Range Status   WBC 11/06/2021 5.7  4.0 - 10.5 K/uL Final   RBC 11/06/2021 4.12 (L)  4.22 - 5.81 MIL/uL Final   Hemoglobin 11/06/2021 13.0  13.0 - 17.0 g/dL Final   HCT 11/06/2021 38.6 (L)  39.0 - 52.0 % Final   MCV 11/06/2021 93.7  80.0 - 100.0 fL Final   MCH 11/06/2021 31.6  26.0 - 34.0 pg Final   MCHC 11/06/2021 33.7  30.0 - 36.0 g/dL Final   RDW 11/06/2021 12.9  11.5 - 15.5 % Final   Platelets 11/06/2021 159  150 - 400 K/uL Final   nRBC 11/06/2021 0.0  0.0 - 0.2 % Final   Performed at [facility], [place], [place] [address]   Sodium 11/06/2021 138  135 - 145 mmol/L Final   Potassium 11/06/2021 3.9  3.5 - 5.1 mmol/L Final   Chloride 11/06/2021 108  98 - 111 mmol/L Final   CO2 11/06/2021 25  22 - 32 mmol/L Final   Glucose, Bld 11/06/2021 92  70 - 99 mg/dL Final   Glucose reference range applies only to samples taken after fasting for at least 8 hours.   BUN 11/06/2021 22  8 - 23 mg/dL Final   Creatinine, Ser 11/06/2021 0.92  0.61 - 1.24 mg/dL Final   Calcium 11/06/2021 9.4  8.9 - 10.3 mg/dL Final   GFR, Estimated 11/06/2021 >60  >60 mL/min Final   Comment: (NOTE) Calculated using the CKD-EPI Creatinine Equation (2021)    Anion gap 11/06/2021 5  5 - 15 Final   Performed at [facility], [address] [place], [place] [address]    ECG: Date: 11/06/2021 Time ECG obtained: 1358 PM Rate: 71 bpm Rhythm: atrial fibrillation Axis (leads I and aVF): Normal Intervals: QRS 94 ms. QTc 432 ms. ST segment and T wave changes: No evidence of acute ST segment elevation or depression Comparison: New onset atrial fibrillation.  Reviewed with [place], NP (cardiology).  Most  recent TTE also reviewed.  Cardiology NP advised that patient cleared to proceed with planned urological procedure.  Cardiology asking that patient follow-up with them next week in the office.  We will make patient aware on day of surgery.   IMAGING / PROCEDURES: CT RENAL STONE STUDY performed on 06/23/2021 Bilateral nonobstructing renal calculi. Multiple stable bilateral renal cysts. Mildly enlarged prostate gland. 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  TRANSTHORACIC ECHOCARDIOGRAM performed on 06/2020 LVEF 60% Severely dilated left atrium Mildly dilated right atrium Aortic root and ascending aorta appear normal in size Normal left ventricular systolic function Mild to moderate left ventricular hypertrophy Diastolic Doppler parameters consistent with impaired relaxation (G1DD).   Normal right ventricular systolic function Normal wall motion Trace pulmonary valve regurgitation Mild to moderate tricuspid, mitral, and aortic valve regurgitation Mild pulmonary hypertension No pericardial effusion   Impression and Plan:  James [name] has been referred for pre-anesthesia review and clearance prior to him undergoing the planned anesthetic and procedural courses. Available labs, pertinent testing, and imaging results were personally reviewed by me. This patient has been appropriately cleared by cardiology with an overall MODERATE risk of significant perioperative cardiovascular complications. Pre-operative ECG reviewed with cardiology (see ECG section above); ok to proceed.    Based on clinical review performed today (11/06/21), barring any significant acute changes in the patient's overall condition, it is anticipated that he will be able to proceed with the planned surgical intervention. Any acute changes in clinical condition may necessitate his procedure being postponed and/or cancelled. Patient will meet with anesthesia team (MD and/or CRNA) on the day of his procedure for preoperative evaluation/assessment. Questions regarding anesthetic course will be fielded at that time.   Pre-surgical instructions were reviewed with the patient during his PAT appointment and questions were fielded by PAT clinical staff. Patient was advised that if any questions or concerns arise prior to his procedure then he should return a call to PAT and/or his surgeon's office to discuss.  Honor Loh, MSN, APRN, FNP-C, CEN [facility]  Peri-operative Services Nurse Practitioner Phone: [phone] Fax: [phone] 11/06/21 4:21 PM  NOTE: This note has been prepared using Dragon dictation software. Despite my best ability to proofread, there is always the potential that unintentional transcriptional errors may still occur from this process.

## 2021-11-05 NOTE — Patient Instructions (Addendum)
Your procedure is scheduled on: 11/07/21 - Friday Report to the Registration Desk on the 1st floor of the [facility]. To find out your arrival time, please call [phone] between 1PM - 3PM on: 11/06/21 - Thursday  REMEMBER: Instructions that are not followed completely may result in serious medical risk, up to and including death; or upon the discretion of your surgeon and anesthesiologist your surgery may need to be rescheduled.  Do not eat food or drink any fluids after midnight the night before surgery.  No gum chewing, lozengers or hard candies.  TAKE THESE MEDICATIONS THE MORNING OF SURGERY WITH A SIP OF WATER:  - pantoprazole (PROTONIX) 40 MG tablet, (take one the night before and one on the morning of surgery - helps to prevent nausea after surgery.)  Stop taking XARELTO 15 MG TABS tablet beginning 02/14, may resume per doctor order.  One week prior to surgery: Stop Anti-inflammatories (NSAIDS) such as Advil, Aleve, Ibuprofen, Motrin, Naproxen, Naprosyn and Aspirin based products such as Excedrin, Goodys Powder, BC Powder.  Stop ANY OVER THE COUNTER supplements until after surgery. Coenzyme Q10 (CO Q-10 ,   You may take Tylenol if needed for pain up until the day of surgery.  No Alcohol for 24 hours before or after surgery.  No Smoking including e-cigarettes for 24 hours prior to surgery.  No chewable tobacco products for at least 6 hours prior to surgery.  No nicotine patches on the day of surgery.  Do not use any "recreational" drugs for at least a week prior to your surgery.  Please be advised that the combination of cocaine and anesthesia may have negative outcomes, up to and including death. If you test positive for cocaine, your surgery will be cancelled.  On the morning of surgery brush your teeth with toothpaste and water, you may rinse your mouth with mouthwash if you wish. Do not swallow any toothpaste or mouthwash.  Do not wear jewelry, make-up, hairpins,  clips or nail polish.  Do not wear lotions, powders, or perfumes.   Do not shave body from the neck down 48 hours prior to surgery just in case you cut yourself which could leave a site for infection.  Also, freshly shaved skin may become irritated if using the CHG soap.  Contact lenses, hearing aids and dentures may not be worn into surgery.  Do not bring valuables to the hospital. [facility] is not responsible for any missing/lost belongings or valuables.   Notify your doctor if there is any change in your medical condition (cold, fever, infection).  Wear comfortable clothing (specific to your surgery type) to the hospital.  After surgery, you can help prevent lung complications by doing breathing exercises.  Take deep breaths and cough every 1-2 hours. Your doctor may order a device called an Incentive Spirometer to help you take deep breaths. When coughing or sneezing, hold a pillow firmly against your incision with both hands. This is called splinting. Doing this helps protect your incision. It also decreases belly discomfort.  If you are being admitted to the hospital overnight, leave your suitcase in the car. After surgery it may be brought to your room.  If you are being discharged the day of surgery, you will not be allowed to drive home. You will need a responsible adult (18 years or older) to drive you home and stay with you that night.   If you are taking public transportation, you will need to have a responsible adult (18 years  or older) with you. Please confirm with your physician that it is acceptable to use public transportation.   Please call the [facility]. at [phone] if you have any questions about these instructions.  Surgery Visitation Policy:  Patients undergoing a surgery or procedure may have one family member or support person with them as long as that person is not COVID-19 positive or experiencing its symptoms.  That person may  remain in the waiting area during the procedure and may rotate out with other people.  Inpatient Visitation:    Visiting hours are 7 a.m. to 8 p.m. Up to two visitors ages 16+ are allowed at one time in a patient room. The visitors may rotate out with other people during the day. Visitors must check out when they leave, or other visitors will not be allowed. One designated support person may remain overnight. The visitor must pass COVID-19 screenings, use hand sanitizer when entering and exiting the patients room and wear a mask at all times, including in the patients room. Patients must also wear a mask when staff or their visitor are in the room. Masking is required regardless of vaccination status.

## 2021-11-06 ENCOUNTER — Encounter: Payer: Self-pay | Admitting: Urgent Care

## 2021-11-06 ENCOUNTER — Other Ambulatory Visit
Admission: RE | Admit: 2021-11-06 | Discharge: 2021-11-06 | Disposition: A | Discharge status: Home / Self Care | Payer: Medicare HMO | Source: Ambulatory Visit | Attending: Urology | Admitting: Urology

## 2021-11-06 ENCOUNTER — Encounter: Payer: Self-pay | Admitting: Urology

## 2021-11-06 DIAGNOSIS — Z01812 Encounter for preprocedural laboratory examination: Secondary | ICD-10-CM

## 2021-11-06 DIAGNOSIS — Z01818 Encounter for other preprocedural examination: Secondary | ICD-10-CM | POA: Diagnosis not present

## 2021-11-06 LAB — BASIC METABOLIC PANEL
Anion gap: 5 (ref 5–15)
BUN: 22 mg/dL (ref 8–23)
CO2: 25 mmol/L (ref 22–32)
Calcium: 9.4 mg/dL (ref 8.9–10.3)
Chloride: 108 mmol/L (ref 98–111)
Creatinine, Ser: 0.92 mg/dL (ref 0.61–1.24)
GFR, Estimated: >60 mL/min (ref >60)
Glucose, Bld: 92 mg/dL (ref 70–99)
Potassium: 3.9 mmol/L (ref 3.5–5.1)
Sodium: 138 mmol/L (ref 135–145)

## 2021-11-06 LAB — CBC
HCT: 38.6 % — ABNORMAL LOW (ref 39.0–52.0)
Hemoglobin: 13 g/dL (ref 13.0–17.0)
MCH: 31.6 pg (ref 26.0–34.0)
MCHC: 33.7 g/dL (ref 30.0–36.0)
MCV: 93.7 fL (ref 80.0–100.0)
Platelets: 159 10*3/uL (ref 150–400)
RBC: 4.12 MIL/uL — ABNORMAL LOW (ref 4.22–5.81)
RDW: 12.9 % (ref 11.5–15.5)
WBC: 5.7 10*3/uL (ref 4.0–10.5)
nRBC: 0 % (ref 0.0–0.2)

## 2021-11-07 ENCOUNTER — Ambulatory Visit
Admission: RE | Admit: 2021-11-07 | Discharge: 2021-11-07 | Disposition: A | Discharge status: Home / Self Care | Payer: Medicare HMO | Attending: Urology | Admitting: Urology

## 2021-11-07 ENCOUNTER — Ambulatory Visit: Payer: Medicare HMO

## 2021-11-07 ENCOUNTER — Ambulatory Visit: Payer: Medicare HMO | Admitting: Urgent Care

## 2021-11-07 ENCOUNTER — Other Ambulatory Visit: Payer: Self-pay

## 2021-11-07 ENCOUNTER — Encounter
Admission: RE | Disposition: A | Discharge status: Home / Self Care | Payer: Self-pay | Source: Home / Self Care | Attending: Urology

## 2021-11-07 ENCOUNTER — Encounter: Payer: Self-pay | Admitting: Urology

## 2021-11-07 DIAGNOSIS — I10 Essential (primary) hypertension: Secondary | ICD-10-CM | POA: Insufficient documentation

## 2021-11-07 DIAGNOSIS — N401 Enlarged prostate with lower urinary tract symptoms: Secondary | ICD-10-CM

## 2021-11-07 DIAGNOSIS — Z951 Presence of aortocoronary bypass graft: Secondary | ICD-10-CM | POA: Insufficient documentation

## 2021-11-07 DIAGNOSIS — Z7901 Long term (current) use of anticoagulants: Secondary | ICD-10-CM | POA: Insufficient documentation

## 2021-11-07 DIAGNOSIS — Z79899 Other long term (current) drug therapy: Secondary | ICD-10-CM | POA: Diagnosis not present

## 2021-11-07 DIAGNOSIS — I2510 Atherosclerotic heart disease of native coronary artery without angina pectoris: Secondary | ICD-10-CM | POA: Diagnosis not present

## 2021-11-07 DIAGNOSIS — Z87891 Personal history of nicotine dependence: Secondary | ICD-10-CM | POA: Insufficient documentation

## 2021-11-07 DIAGNOSIS — N200 Calculus of kidney: Secondary | ICD-10-CM | POA: Diagnosis not present

## 2021-11-07 DIAGNOSIS — I4891 Unspecified atrial fibrillation: Secondary | ICD-10-CM | POA: Insufficient documentation

## 2021-11-07 DIAGNOSIS — N202 Calculus of kidney with calculus of ureter: Secondary | ICD-10-CM | POA: Insufficient documentation

## 2021-11-07 DIAGNOSIS — K449 Diaphragmatic hernia without obstruction or gangrene: Secondary | ICD-10-CM | POA: Diagnosis not present

## 2021-11-07 DIAGNOSIS — Z87442 Personal history of urinary calculi: Secondary | ICD-10-CM | POA: Insufficient documentation

## 2021-11-07 DIAGNOSIS — N138 Other obstructive and reflux uropathy: Secondary | ICD-10-CM | POA: Insufficient documentation

## 2021-11-07 HISTORY — DX: Deficiency of other specified B group vitamins: E53.8

## 2021-11-07 HISTORY — DX: Diaphragmatic hernia without obstruction or gangrene: K44.9

## 2021-11-07 HISTORY — DX: Atrioventricular block, second degree: I44.1

## 2021-11-07 HISTORY — DX: Obstructive sleep apnea (adult) (pediatric): G47.33

## 2021-11-07 HISTORY — DX: Atherosclerosis of aorta: I70.0

## 2021-11-07 HISTORY — DX: Other hemorrhoids: K64.8

## 2021-11-07 HISTORY — DX: Cyst of kidney, acquired: N28.1

## 2021-11-07 HISTORY — PX: CYSTOSCOPY/URETEROSCOPY/HOLMIUM LASER/STENT PLACEMENT: SHX6546

## 2021-11-07 HISTORY — DX: Bradycardia, unspecified: R00.1

## 2021-11-07 HISTORY — DX: Long term (current) use of anticoagulants: Z79.01

## 2021-11-07 HISTORY — PX: HOLEP-LASER ENUCLEATION OF THE PROSTATE WITH MORCELLATION: SHX6641

## 2021-11-07 HISTORY — DX: Hyperlipidemia, unspecified: E78.5

## 2021-11-07 HISTORY — DX: Other specified disorders of bone density and structure, unspecified site: M85.80

## 2021-11-07 HISTORY — DX: Benign prostatic hyperplasia without lower urinary tract symptoms: N40.0

## 2021-11-07 SURGERY — ENUCLEATION, PROSTATE, USING LASER, WITH MORCELLATION
Anesthesia: General | Laterality: Right

## 2021-11-07 MED ORDER — SUGAMMADEX SODIUM 200 MG/2ML IV SOLN
INTRAVENOUS | Status: DC | PRN
  Administered 2021-11-07: 200 mg via INTRAVENOUS

## 2021-11-07 MED ORDER — ACETAMINOPHEN 10 MG/ML IV SOLN: 1000.0000 mg | Freq: Once | INTRAVENOUS | Status: DC | PRN

## 2021-11-07 MED ORDER — LACTATED RINGERS IV SOLN: INTRAVENOUS | Status: DC

## 2021-11-07 MED ORDER — ROCURONIUM BROMIDE 10 MG/ML (PF) SYRINGE
PREFILLED_SYRINGE | INTRAVENOUS | Status: AC
  Filled 2021-11-07: qty 10

## 2021-11-07 MED ORDER — CEFAZOLIN SODIUM-DEXTROSE 2-4 GM/100ML-% IV SOLN
2.0000 g | INTRAVENOUS | Status: AC
  Administered 2021-11-07: 2 g via INTRAVENOUS

## 2021-11-07 MED ORDER — SODIUM CHLORIDE 0.9 % IR SOLN
Status: DC | PRN
  Administered 2021-11-07: 27000 mL

## 2021-11-07 MED ORDER — OXYCODONE HCL 5 MG PO TABS: 5.0000 mg | ORAL_TABLET | Freq: Once | ORAL | Status: DC | PRN

## 2021-11-07 MED ORDER — OXYCODONE HCL 5 MG/5ML PO SOLN: 5.0000 mg | Freq: Once | ORAL | Status: DC | PRN

## 2021-11-07 MED ORDER — ORAL CARE MOUTH RINSE: 15.0000 mL | Freq: Once | OROMUCOSAL | Status: AC

## 2021-11-07 MED ORDER — ONDANSETRON HCL 4 MG/2ML IJ SOLN: 4.0000 mg | Freq: Once | INTRAMUSCULAR | Status: DC | PRN

## 2021-11-07 MED ORDER — ACETAMINOPHEN 10 MG/ML IV SOLN
INTRAVENOUS | Status: AC
  Filled 2021-11-07: qty 100

## 2021-11-07 MED ORDER — CHLORHEXIDINE GLUCONATE 0.12 % MT SOLN
OROMUCOSAL | Status: AC
  Administered 2021-11-07: 15 mL via OROMUCOSAL
  Filled 2021-11-07: qty 15

## 2021-11-07 MED ORDER — LIDOCAINE HCL (PF) 2 % IJ SOLN
INTRAMUSCULAR | Status: AC
  Filled 2021-11-07: qty 5

## 2021-11-07 MED ORDER — HYDROCODONE-ACETAMINOPHEN 5-325 MG PO TABS: 1.0000 | ORAL_TABLET | Freq: Four times a day (QID) | ORAL | 0 refills | Status: AC | PRN

## 2021-11-07 MED ORDER — ROCURONIUM BROMIDE 100 MG/10ML IV SOLN
INTRAVENOUS | Status: DC | PRN
Start: 2021-11-07 — End: 2021-11-07
  Administered 2021-11-07: 40 mg via INTRAVENOUS
  Administered 2021-11-07 (×4): 10 mg via INTRAVENOUS

## 2021-11-07 MED ORDER — FENTANYL CITRATE (PF) 100 MCG/2ML IJ SOLN
INTRAMUSCULAR | Status: DC | PRN
  Administered 2021-11-07: 50 ug via INTRAVENOUS
  Administered 2021-11-07 (×2): 25 ug via INTRAVENOUS

## 2021-11-07 MED ORDER — PROPOFOL 10 MG/ML IV BOLUS
INTRAVENOUS | Status: DC | PRN
Start: 2021-11-07 — End: 2021-11-07
  Administered 2021-11-07: 120 mg via INTRAVENOUS

## 2021-11-07 MED ORDER — CHLORHEXIDINE GLUCONATE 0.12 % MT SOLN: 15.0000 mL | Freq: Once | OROMUCOSAL | Status: AC

## 2021-11-07 MED ORDER — IOHEXOL 180 MG/ML  SOLN
INTRAMUSCULAR | Status: DC | PRN
  Administered 2021-11-07: 10 mL

## 2021-11-07 MED ORDER — EPHEDRINE SULFATE (PRESSORS) 50 MG/ML IJ SOLN
INTRAMUSCULAR | Status: DC | PRN
Start: 2021-11-07 — End: 2021-11-07
  Administered 2021-11-07: 5 mg via INTRAVENOUS
  Administered 2021-11-07: 10 mg via INTRAVENOUS
  Administered 2021-11-07 (×3): 5 mg via INTRAVENOUS

## 2021-11-07 MED ORDER — ONDANSETRON HCL 4 MG/2ML IJ SOLN
INTRAMUSCULAR | Status: AC
  Filled 2021-11-07: qty 2

## 2021-11-07 MED ORDER — LIDOCAINE HCL (CARDIAC) PF 100 MG/5ML IV SOSY
PREFILLED_SYRINGE | INTRAVENOUS | Status: DC | PRN
Start: 2021-11-07 — End: 2021-11-07
  Administered 2021-11-07: 100 mg via INTRAVENOUS

## 2021-11-07 MED ORDER — ACETAMINOPHEN 10 MG/ML IV SOLN
INTRAVENOUS | Status: DC | PRN
Start: 2021-11-07 — End: 2021-11-07
  Administered 2021-11-07: 1000 mg via INTRAVENOUS

## 2021-11-07 MED ORDER — ONDANSETRON HCL 4 MG/2ML IJ SOLN
INTRAMUSCULAR | Status: DC | PRN
  Administered 2021-11-07: 4 mg via INTRAVENOUS

## 2021-11-07 MED ORDER — PROPOFOL 10 MG/ML IV BOLUS
INTRAVENOUS | Status: AC
  Filled 2021-11-07: qty 20

## 2021-11-07 MED ORDER — FENTANYL CITRATE (PF) 100 MCG/2ML IJ SOLN: 25.0000 ug | INTRAMUSCULAR | Status: DC | PRN

## 2021-11-07 MED ORDER — PHENYLEPHRINE HCL-NACL 20-0.9 MG/250ML-% IV SOLN
INTRAVENOUS | Status: DC | PRN
  Administered 2021-11-07: 25 ug/min via INTRAVENOUS

## 2021-11-07 MED ORDER — DEXAMETHASONE SODIUM PHOSPHATE 10 MG/ML IJ SOLN
INTRAMUSCULAR | Status: AC
  Filled 2021-11-07: qty 1

## 2021-11-07 MED ORDER — DEXAMETHASONE SODIUM PHOSPHATE 10 MG/ML IJ SOLN
INTRAMUSCULAR | Status: DC | PRN
Start: 2021-11-07 — End: 2021-11-07
  Administered 2021-11-07: 5 mg via INTRAVENOUS

## 2021-11-07 MED ORDER — CEFAZOLIN SODIUM-DEXTROSE 2-4 GM/100ML-% IV SOLN
INTRAVENOUS | Status: AC
  Filled 2021-11-07: qty 100

## 2021-11-07 MED ORDER — OXYCODONE HCL 5 MG PO TABS: 5.0000 mg | ORAL_TABLET | Freq: Four times a day (QID) | ORAL | 0 refills | Status: AC | PRN

## 2021-11-07 MED ORDER — FENTANYL CITRATE (PF) 100 MCG/2ML IJ SOLN
INTRAMUSCULAR | Status: AC
  Filled 2021-11-07: qty 2

## 2021-11-07 SURGICAL SUPPLY — 55 items
ADAPTER IRRIG TUBE 2 SPIKE SOL (ADAPTER) ×6 IMPLANT
ADHESIVE MASTISOL STRL (MISCELLANEOUS) IMPLANT
BAG DRAIN CYSTO-URO LG1000N (MISCELLANEOUS) ×3 IMPLANT
BAG URO DRAIN 4000ML (MISCELLANEOUS) ×3 IMPLANT
BASKET ZERO TIP 1.9FR (BASKET) ×1 IMPLANT
BRUSH SCRUB EZ 1% IODOPHOR (MISCELLANEOUS) ×3 IMPLANT
CATH FOLEY 3WAY 30CC 24FR (CATHETERS) ×1
CATH URET FLEX-TIP 2 LUMEN 10F (CATHETERS) IMPLANT
CATH URETL OPEN 5X70 (CATHETERS) IMPLANT
CATH URETL OPEN END 4X70 (CATHETERS) ×3 IMPLANT
CATH URTH STD 24FR FL 3W 2 (CATHETERS) ×2 IMPLANT
CNTNR SPEC 2.5X3XGRAD LEK (MISCELLANEOUS)
CONT SPEC 4OZ STER OR WHT (MISCELLANEOUS)
CONTAINER COLLECT MORCELLATR (MISCELLANEOUS) ×2 IMPLANT
CONTAINER SPEC 2.5X3XGRAD LEK (MISCELLANEOUS) IMPLANT
DRAPE UTILITY 15X26 TOWEL STRL (DRAPES) ×3 IMPLANT
DRSG TEGADERM 2-3/8X2-3/4 SM (GAUZE/BANDAGES/DRESSINGS) IMPLANT
ELECT BIVAP BIPO 22/24 DONUT (ELECTROSURGICAL)
ELECTRD BIVAP BIPO 22/24 DONUT (ELECTROSURGICAL) IMPLANT
FIBER LASER MOSES 550 DFL (Laser) ×1 IMPLANT
FILTER OVERFLOW MORCELLATOR (FILTER) ×2 IMPLANT
GAUZE 4X4 16PLY ~~LOC~~+RFID DBL (SPONGE) ×6 IMPLANT
GLOVE SURG UNDER POLY LF SZ7.5 (GLOVE) ×3 IMPLANT
GOWN STRL REUS W/ TWL LRG LVL3 (GOWN DISPOSABLE) ×2 IMPLANT
GOWN STRL REUS W/ TWL XL LVL3 (GOWN DISPOSABLE) ×2 IMPLANT
GOWN STRL REUS W/TWL LRG LVL3 (GOWN DISPOSABLE) ×1
GOWN STRL REUS W/TWL XL LVL3 (GOWN DISPOSABLE) ×1
GUIDEWIRE STR DUAL SENSOR (WIRE) ×3 IMPLANT
GUIDEWIRE ZIPWIRE .035X180CM (WIRE) ×1 IMPLANT
HOLDER FOLEY CATH W/STRAP (MISCELLANEOUS) ×3 IMPLANT
INFUSOR MANOMETER BAG 3000ML (MISCELLANEOUS) ×3 IMPLANT
IV NS IRRIG 3000ML ARTHROMATIC (IV SOLUTION) ×18 IMPLANT
KIT TURNOVER CYSTO (KITS) ×3 IMPLANT
MANIFOLD NEPTUNE II (INSTRUMENTS) IMPLANT
MBRN O SEALING YLW 17 FOR INST (MISCELLANEOUS) ×3
MEMBRANE SLNG YLW 17 FOR INST (MISCELLANEOUS) ×2 IMPLANT
MORCELLATOR COLLECT CONTAINER (MISCELLANEOUS) ×3
MORCELLATOR OVERFLOW FILTER (FILTER) ×3
MORCELLATOR ROTATION 4.75 335 (MISCELLANEOUS) ×3 IMPLANT
PACK CYSTO AR (MISCELLANEOUS) ×3 IMPLANT
SET CYSTO W/LG BORE CLAMP LF (SET/KITS/TRAYS/PACK) ×3 IMPLANT
SET IRRIG Y TYPE TUR BLADDER L (SET/KITS/TRAYS/PACK) ×3 IMPLANT
SHEATH URETERAL 12FRX35CM (MISCELLANEOUS) IMPLANT
SLEEVE PROTECTION STRL DISP (MISCELLANEOUS) ×6 IMPLANT
STENT URET 6FRX24 CONTOUR (STENTS) IMPLANT
STENT URET 6FRX26 CONTOUR (STENTS) ×1 IMPLANT
SURGILUBE 2OZ TUBE FLIPTOP (MISCELLANEOUS) ×3 IMPLANT
SYR 10ML LL (SYRINGE) ×3 IMPLANT
SYR TOOMEY IRRIG 70ML (MISCELLANEOUS) ×3
SYRINGE TOOMEY IRRIG 70ML (MISCELLANEOUS) ×2 IMPLANT
TRACTIP FLEXIVA PULSE ID 200 (Laser) ×1 IMPLANT
TUBE PUMP MORCELLATOR PIRANHA (TUBING) ×3 IMPLANT
VALVE UROSEAL ADJ ENDO (VALVE) IMPLANT
WATER STERILE IRR 1000ML POUR (IV SOLUTION) ×3 IMPLANT
WATER STERILE IRR 500ML POUR (IV SOLUTION) ×3 IMPLANT

## 2021-11-07 NOTE — Anesthesia Procedure Notes (Signed)
Procedure Name: Intubation Date/Time: 11/07/2021 10:20 AM Performed by: Loletha Grayer, CRNA Pre-anesthesia Checklist: Patient identified, Patient being monitored, Timeout performed, Emergency Drugs available and Suction available Patient Re-evaluated:Patient Re-evaluated prior to induction Oxygen Delivery Method: Circle system utilized Preoxygenation: Pre-oxygenation with 100% oxygen Induction Type: IV induction Ventilation: Mask ventilation without difficulty and Oral airway inserted - appropriate to patient size Laryngoscope Size: McGraph and 4 Grade View: Grade I Tube type: Oral Tube size: 7.0 mm Number of attempts: 1 Airway Equipment and Method: Stylet Placement Confirmation: ETT inserted through vocal cords under direct vision, positive ETCO2 and breath sounds checked- equal and bilateral Secured at: 22 cm Tube secured with: Tape Dental Injury: Teeth and Oropharynx as per pre-operative assessment

## 2021-11-07 NOTE — Op Note (Signed)
Date of procedure: 11/07/21  Preoperative diagnosis:  Right renal stone BPH with obstruction  Postoperative diagnosis:  Right distal ureteral stone Right renal stone BPH with obstruction  Procedure: Right ureteroscopy, laser lithotripsy of ureteral stone, basket extraction of stones Right ureteroscopy, laser lithotripsy of renal stone Cystoscopy, right right retrograde pyelogram with intraoperative interpretation, right ureteral stent placement HoLEP (Holmium Laser Enucleation of the Prostate)  Surgeon: Nickolas Madrid, MD  Anesthesia: General  Complications: None  Intraoperative findings:  Large 1 cm stone had migrated to the right distal ureter, additional 5 mm right renal stone, both dusted, uncomplicated stent placement Moderate trabeculations, uncomplicated HOLEP, excellent hemostasis, ureteral orifices and verumontanum intact at conclusion of case  EBL: Minimal  Specimens: Prostate chips  Drains: 24 French three-way, 60 cc in balloon  Indication: James [name] is a 86 y.o. patient with right renal stone, gross hematuria, BPH with obstructive symptoms.  After reviewing the management options for treatment, they elected to proceed with the above surgical procedure(s). We have discussed the potential benefits and risks of the procedure, side effects of the proposed treatment, the likelihood of the patient achieving the goals of the procedure, and any potential problems that might occur during the procedure or recuperation.  We specifically discussed the risks of bleeding, infection, hematuria and clot retention, need for additional procedures, possible overnight hospital stay, temporary urgency and incontinence, rare long-term incontinence, and retrograde ejaculation.  Informed consent has been obtained.   Description of procedure:  The patient was taken to the operating room and general anesthesia was induced.  The patient was placed in the dorsal lithotomy position,  prepped and draped in the usual sterile fashion, and preoperative antibiotics(Ancef) were administered.  SCDs were placed for DVT prophylaxis.  A preoperative time-out was performed.   Leander Rams sounds were used to gently dilated the urethra up to 43F.   A 21 French rigid cystoscope was used to intubate the urethra and a normal-appearing urethra was followed proximally into the bladder.  The prostate was moderate in size with lateral lobe hypertrophy and a small median lobe.  There were moderate bladder trabeculations.  No suspicious lesions seen in the bladder.  I started by advancing a sensor wire into the right ureteral orifice but met resistance immediately.  With the aid of a 5 French access catheter and a Glidewire I was able to advance the wire alongside a large right distal ureteral stone up to the kidney under fluoroscopic vision.  A semirigid ureteroscope was advanced alongside the wire and there was a large impacted stone in the distal ureter.  This was fragmented to dust using a 242 m laser fiber on settings of 1.0 J and 10 Hz.  All pieces were irrigated and basketed free from the ureter.  Thorough ureteroscopy showed no other fragments or injury.  A second sensor wire was added through the scope as a safety wire.  A single channel digital flexible ureteroscope advanced over the wire easily up to the kidney under fluoroscopic vision.  There was a 5 mm black stone in the renal pelvis, and this was fragmented to dust on previously mentioned settings.  Thorough pyeloscopy revealed no other stones.  Retrograde pyelogram from the proximal ureter showed no extravasation or filling defects.  Careful pullback ureteroscopy showed no injury or residual fragments.  A rigid cystoscope was backloaded over the wire, and a 6 [place] by 26 cm ureteral stent was uneventfully placed with a curl in the renal pelvis, as well as  under direct vision the bladder.  The 6 French continuous flow resectoscope was  inserted into the urethra using the visual obturator.  The laser was set to 2 J and 60 Hz and was used to make a lambda incision just proximal to the verumontanum down to the level of the capsule.  A 5 and 7 o'clock incision were then made down to the level of the capsule from the bladder neck to the verumontanum, and the median lobe was enucleated into the bladder.  The lateral lobes were then incised circumferentially until they were disconnected from the surrounding tissue.  The capsule was examined and laser was used for meticulous hemostasis.    The 97 French resectoscope was then switched out for the 43 French nephroscope and the lobes were morcellated and the tissue sent to pathology.  A 24 French three-way catheter was inserted easily, and 60 cc were placed in the balloon.  Urine was clear.  The catheter irrigated easily with a Toomey syringe.  CBI was initiated. A belladonna suppository was placed.  The patient tolerated the procedure well without any immediate complications and was extubated and transferred to the recovery room in stable condition.  Urine was clear on fast CBI.  Disposition: Stable to PACU  Plan: Wean CBI in PACU, anticipate discharge home today with Foley removal in clinic in 2-3 days Schedule stent removal in clinic in 1 to 2 weeks  Nickolas Madrid, MD 11/07/2021

## 2021-11-07 NOTE — Discharge Instructions (Addendum)
AMBULATORY SURGERY  ?DISCHARGE INSTRUCTIONS ? ? ?The drugs that you were given will stay in your system until tomorrow so for the next 24 hours you should not: ? ?Drive an automobile ?Make any legal decisions ?Drink any alcoholic beverage ? ? ?You may resume regular meals tomorrow.  Today it is better to start with liquids and gradually work up to solid foods. ? ?You may eat anything you prefer, but it is better to start with liquids, then soup and crackers, and gradually work up to solid foods. ? ? ?Please notify your doctor immediately if you have any unusual bleeding, trouble breathing, redness and pain at the surgery site, drainage, fever, or pain not relieved by medication. ? ? ? ?Additional Instructions: ? ? ? ?Please contact your physician with any problems or Same Day Surgery at [phone], Monday through Friday 6 am to 4 pm, or [facility] Main number at [phone].  ?

## 2021-11-07 NOTE — Anesthesia Preprocedure Evaluation (Addendum)
Anesthesia Evaluation  Patient identified by MRN, date of birth, ID band Patient awake    Reviewed: Allergy & Precautions, NPO status , Patient's Chart, lab work & pertinent test results  History of Anesthesia Complications Negative for: history of anesthetic complications  Airway Mallampati: II   Neck ROM: Full    Dental  (+) Lower Dentures, Upper Dentures   Pulmonary sleep apnea , COPD, former smoker (quit 2015),    Pulmonary exam normal breath sounds clear to auscultation       Cardiovascular hypertension, + CAD (s/p CABG)  Normal cardiovascular exam+ dysrhythmias (a fib on Xarelto)  Rhythm:Regular Rate:Normal  ECG 11/06/21:  Atrial fibrillation with a competing junctional pacemaker Cannot rule out Anterior infarct , age undetermined  Echo 06/2020 1. LVEF 60% 2. Severely dilated left atrium 3. Mildly dilated right atrium 4. Aortic root and ascending aorta appear normal in size 5. Normal left ventricular systolic function 6. Mild to moderate left ventricular hypertrophy 7. Diastolic Doppler parameters consistent with impaired relaxation (G1DD).   8. Normal right ventricular systolic function 9. Normal wall motion 10. Trace pulmonary valve regurgitation 11. Mild to moderate tricuspid, mitral, and aortic valve regurgitation 12. Mild pulmonary hypertension 13. No pericardial effusion   Neuro/Psych PSYCHIATRIC DISORDERS Anxiety negative neurological ROS     GI/Hepatic GERD  ,  Endo/Other  negative endocrine ROS  Renal/GU Renal disease (nephrolithiasis)   BPH    Musculoskeletal   Abdominal   Peds  Hematology negative hematology ROS (+)   Anesthesia Other Findings Reviewed and agree with Bayard Males pre-anesthesia clinical review note: appropriately cleared by cardiology with an overall MODERATE risk of significant perioperative cardiovascular complications. Pre-operative ECG reviewed with cardiology; ok to  proceed.  Reproductive/Obstetrics                            Anesthesia Physical Anesthesia Plan  ASA: 3  Anesthesia Plan: General   Post-op Pain Management:    Induction: Intravenous  PONV Risk Score and Plan: 2 and Ondansetron, Dexamethasone and Treatment may vary due to age or medical condition  Airway Management Planned: Oral ETT  Additional Equipment:   Intra-op Plan:   Post-operative Plan: Extubation in OR  Informed Consent: I have reviewed the patients History and Physical, chart, labs and discussed the procedure including the risks, benefits and alternatives for the proposed anesthesia with the patient or authorized representative who has indicated his/her understanding and acceptance.     Dental advisory given  Plan Discussed with: CRNA  Anesthesia Plan Comments: (Patient consented for risks of anesthesia including but not limited to:  - adverse reactions to medications - damage to eyes, teeth, lips or other oral mucosa - nerve damage due to positioning  - sore throat or hoarseness - damage to heart, brain, nerves, lungs, other parts of body or loss of life  Informed patient about role of CRNA in peri- and intra-operative care.  Patient voiced understanding.)        Anesthesia Quick Evaluation

## 2021-11-07 NOTE — H&P (Signed)
11/07/21 9:55 AM   James [name] [birth year] [id number]  CC: BPH, gross hematuria, right renal stone  HPI: 86 year old male on maximal medical therapy for BPH with finasteride and doxazosin, and also has a history of kidney stones.  He has had problems with intermittent gross hematuria, as well as right-sided flank pain over the last 6 to 12 months.  CT in April and October 2022 showed a 1 cm right renal pelvis stone likely causing some intermittent ball valving obstruction, and prostate measures 43 g.  He has continued to have these issues, with 2 episodes of gross hematuria, as well as right-sided pain in the 2 months.  The hematuria is not associated with the pain, so unclear if gross hematuria related to BPH or the right renal stone.  He had a cystoscopy in April 2022 that showed a moderate size prostate with small median lobe, no suspicious lesions.   He continues to be very bothered by the intermittent gross hematuria, right flank pain, as well as his urinary symptoms of weak stream, urgency, and frequency.  Urinalysis today with 11-30 RBCs, but otherwise completely benign, PVR today is normal at 35 mL.  We discussed options at length moving forward including trial of an OAB medication, right ureteroscopy/laser/stent for his right renal stone causing likely intermittent obstruction and pain, as well as consideration of an outlet procedure like HOLEP that would likely address both his gross hematuria from BPH as well as his urinary symptoms, and would be able to get off of his 2 medications for the prostate.      PMH: Past Medical History:  Diagnosis Date   Anxiety    Aortic atherosclerosis ([place])    Aortic dilatation ([facility]) 01/10/2021   a.) CT 01/10/2021: infrarenal aortic dilitation measuring 3.3 x 3.3 cm. b.) CT 06/23/2021: measured 3.4 cm.   Arthritis    B12 deficiency    Basal cell carcinoma 03/14/2019   R nasal tip    Bilateral renal cysts    BPH (benign prostatic  hyperplasia)    Bradycardia    Chronic kidney disease    COPD (chronic obstructive pulmonary disease) (HCC)    Coronary artery disease    a.) s/p 2v CABG in 2001; performed while living in [place].   Diastolic dysfunction 00/1749   a.) TTE 06/2020: EF 60%; sev LA dilation, mild RA dilation; triv PR, mild to mod TR/MR/AR; G1DD.   Frequency of urination    GERD (gastroesophageal reflux disease)    Hiatal hernia    History of kidney stones    HLD (hyperlipidemia)    Hypertension    Internal hemorrhoids    Long term current use of anticoagulant    a.) Rivaroxaban   Mobitz type 1 second degree AV block    OSA (obstructive sleep apnea)    a.) does not require nocturnal PAP therapy   Osteopenia    Pulmonary HTN ([place]) 06/2020   a.) mild   S/P CABG x 2 2001   a.) details unknown; performed in 2001 in [place].    Surgical History: Past Surgical History:  Procedure Laterality Date   CARDIAC CATHETERIZATION     COLONOSCOPY     CORONARY ARTERY BYPASS GRAFT     FRACTURE SURGERY Left    fractured left arm   LIPOMA EXCISION N/A 11/21/2018   Procedure: EXCISION BACK LIPOMA;  Surgeon: Jovita Kussmaul, MD;  Location: [facility];  Service: General;  Laterality: N/A;   PILONIDAL CYST EXCISION  SKIN CANCER EXCISION     TOTAL SHOULDER REPLACEMENT Left    UMBILICAL HERNIA REPAIR N/A 11/21/2018   Procedure: HERNIA REPAIR UMBILICAL ADULT WITH MESH;  Surgeon: Jovita Kussmaul, MD;  Location: [facility];  Service: General;  Laterality: N/A;     Family History: Family History  Problem Relation Age of Onset   Prostate cancer Neg Hx    Bladder Cancer Neg Hx    Kidney cancer Neg Hx     Social History:  reports that he quit smoking about 8 years ago. His smoking use included cigarettes. He has a 20.00 pack-year smoking history. He has never used smokeless tobacco. He reports current alcohol use. He reports that he does not use drugs.  Physical Exam: BP (!) 172/101    Pulse (!) 43    Temp  98.3 F (36.8 C) (Temporal)    Resp 16    Ht 5\' 9"  (1.753 m)    Wt 80.3 kg    SpO2 100%    BMI 26.14 kg/m    Constitutional:  Alert and oriented, No acute distress. Cardiovascular: regular rate and rhythm Respiratory: Clear to auscultation bilaterally  Laboratory Data: Culture 10/22/2021 no growth  Assessment & Plan:   86 year old male with intermittent right-sided flank pain likely secondary to 1 cm right renal stone with intermittent obstruction, as well as BPH symptoms with weak stream, urgency, frequency despite maximal medical therapy, and intermittent gross hematuria likely from friable BPH.  We specifically discussed the risks ureteroscopy including bleeding, infection/sepsis, stent related symptoms including flank pain/urgency/frequency/incontinence/dysuria, ureteral injury, inability to access stone, or need for staged or additional procedures. We discussed the risks and benefits of HoLEP at length.  The procedure requires general anesthesia and takes 1 to 2 hours, and a holmium laser is used to enucleate the prostate and push this tissue into the bladder.  A morcellator is then used to remove this tissue, which is sent for pathology.  The vast majority(>95%) of patients are able to discharge the same day with a catheter in place for 2 to 3 days, and will follow-up in clinic for a voiding trial.  We specifically discussed the risks of bleeding, infection, retrograde ejaculation, temporary urgency and urge incontinence, very low risk of long-term incontinence, urethral stricture/bladder neck contracture, pathologic evaluation of prostate tissue and possible detection of prostate cancer or other malignancy, and possible need for additional procedures.  Right ureteroscopy, laser lithotripsy, stent placement, HOLEP today   Nickolas Madrid, MD 11/07/2021  [facility] [address], [place], [place] [address] [phone]

## 2021-11-07 NOTE — Anesthesia Postprocedure Evaluation (Signed)
Anesthesia Post Note  Patient: James [name]  Procedure(s) Performed: HOLEP-LASER ENUCLEATION OF THE PROSTATE WITH MORCELLATION CYSTOSCOPY/URETEROSCOPY/HOLMIUM LASER/STENT PLACEMENT (Right)  Patient location during evaluation: PACU Anesthesia Type: General Level of consciousness: awake and alert, oriented and patient cooperative Pain management: pain level controlled Vital Signs Assessment: post-procedure vital signs reviewed and stable Respiratory status: spontaneous breathing, nonlabored ventilation and respiratory function stable Cardiovascular status: blood pressure returned to baseline and stable Postop Assessment: adequate PO intake Anesthetic complications: no   No notable events documented.   Last Vitals:  Vitals:   11/07/21 1300 11/07/21 1315  BP: (!) 148/97 140/85  Pulse: 87 82  Resp: (!) 21 20  Temp:    SpO2: 92% 91%    Last Pain:  Vitals:   11/07/21 1315  TempSrc:   PainSc: 0-No pain                 Darrin Nipper

## 2021-11-07 NOTE — Transfer of Care (Addendum)
Immediate Anesthesia Transfer of Care Note  Patient: James [name]  Procedure(s) Performed: HOLEP-LASER ENUCLEATION OF THE PROSTATE WITH MORCELLATION CYSTOSCOPY/URETEROSCOPY/HOLMIUM LASER/STENT PLACEMENT (Right)  Patient Location: PACU  Anesthesia Type:General  Level of Consciousness: drowsy  Airway & Oxygen Therapy: Patient Spontanous Breathing and Patient connected to face mask oxygen  Post-op Assessment: Report given to RN and Post -op Vital signs reviewed and stable  Post vital signs: Reviewed and stable  Last Vitals:  Vitals Value Taken Time  BP 164/101 11/07/21 1219  Temp 36 C 11/07/21 1219  Pulse 43 11/07/21 1223  Resp 17 11/07/21 1223  SpO2 99 % 11/07/21 1223  Vitals shown include unvalidated device data.  Last Pain:  Vitals:   11/07/21 0853  TempSrc: Temporal  PainSc: 0-No pain         Complications: No notable events documented.

## 2021-11-08 ENCOUNTER — Encounter: Payer: Self-pay | Admitting: Urology

## 2021-11-10 ENCOUNTER — Ambulatory Visit: Payer: Medicare HMO

## 2021-11-10 ENCOUNTER — Other Ambulatory Visit: Payer: Self-pay

## 2021-11-10 DIAGNOSIS — N138 Other obstructive and reflux uropathy: Secondary | ICD-10-CM

## 2021-11-10 LAB — SURGICAL PATHOLOGY

## 2021-11-10 NOTE — Progress Notes (Signed)
Catheter Removal  Patient is present today for a catheter removal. 73ml of water was drained from the balloon. A 24FR foley cath was removed from the bladder no complications were noted. Patient tolerated well. Patient given patient education on what to expect post HoLEP.   Performed by: Gordy Clement, CMA   Follow up/ Additional notes: RTC in 2 week for stent removal.

## 2021-11-10 NOTE — Patient Instructions (Addendum)
Congratulations on your recent HOLEP procedure! As discussed in clinic today, these are the three normal postoperative findings after this surgery: Burning or pain with urination: This typically resolves within 1 week of surgery. If you are still having significant pain with urination 10 days after surgery, please call our clinic. We may need to check you for a urinary tract infection at that point, though this is rare. Blood in the urine: This may either come and go or steadily improve before going away, but typically resolves completely within 3 weeks of surgery. If you are on blood thinners, it may take longer for the bleeding to resolve. Please note that you may find that you pass clumps of tissue or debris around 10-14 days after surgery associated with some new bleeding. This is due to sloughing of your postoperative scab and is also normal. If at any point you start to pass dark red urine; thick, ketchup-like urine; or large blood clots around the size of your palm, please call our office immediately. Urinary leakage or urgency: This tends to improve with time, with most patients becoming dry within around 3 months of surgery. You may wear absorbant underwear or liners for security during this time. To help you get dry faster, please make sure you are completing your Kegel exercises as instructed, with a set of 10 exercises completed up to three times daily. Further information on Kegel exercises can be found in the back of this packet.   Ureteral Stent Implantation Ureteral stent implantation is a procedure to insert (implant) a flexible, soft, plastic tube (stent) into a ureter. Ureters are the tube-like parts of the body that drain urine from the kidneys. The stent supports the ureter while it heals and helps to drain urine. You may have a ureteral stent implanted after having a procedure to remove a blockage from the ureter (ureterolysis or pyeloplasty). You may also have a stent implanted to open  the flow of urine when you have a blockage caused by a kidney stone, tumor, blood clot, or infection. You have two ureters, one on each side of the body. The ureters connect the kidneys to the organ that holds urine until it passes out of the body (bladder). The stent is placed so that one end is in the kidney, and one end is in the bladder. The stent is usually taken out after your ureter has healed. Depending on your condition, you may have a stent for just a few weeks, or you may have a long-term stent that will need to be replaced every few months. Tell a health care provider about: Any allergies you have. All medicines you are taking, including vitamins, herbs, eye drops, creams, and over-the-counter medicines. Any problems you or family members have had with anesthetic medicines. Any blood disorders you have. Any surgeries you have had. Any medical conditions you have. Whether you are pregnant or may be pregnant. What are the risks? Generally, this is a safe procedure. However, problems may occur, including: Infection. Bleeding. Allergic reactions to medicines. Damage to other structures or organs. Tearing (perforation) of the ureter is possible. Movement of the stent away from where it is placed during surgery (migration). What happens before the procedure? Medicines Ask your health care provider about: Changing or stopping your regular medicines. This is especially important if you are taking diabetes medicines or blood thinners. Taking medicines such as aspirin and ibuprofen. These medicines can thin your blood. Do not take these medicines unless your health care provider  tells you to take them. Taking over-the-counter medicines, vitamins, herbs, and supplements. Eating and drinking Follow instructions from your health care provider about eating and drinking, which may include: 8 hours before the procedure - stop eating heavy meals or foods, such as meat, fried foods, or fatty  foods. 6 hours before the procedure - stop eating light meals or foods, such as toast or cereal. 6 hours before the procedure - stop drinking milk or drinks that contain milk. 2 hours before the procedure - stop drinking clear liquids. Staying hydrated Follow instructions from your health care provider about hydration, which may include: Up to 2 hours before the procedure - you may continue to drink clear liquids, such as water, clear fruit juice, black coffee, and plain tea. General instructions Do not drink alcohol. Do not use any products that contain nicotine or tobacco for at least 4 weeks before the procedure. These products include cigarettes, e-cigarettes, and chewing tobacco. If you need help quitting, ask your health care provider. You may have an exam or testing, such as imaging or blood tests. Ask your health care provider what steps will be taken to help prevent infection. These may include: Removing hair at the surgery site. Washing skin with a germ-killing soap. Taking antibiotic medicine. Plan to have someone take you home from the hospital or clinic. If you will be going home right after the procedure, plan to have someone with you for 24 hours. What happens during the procedure? An IV will be inserted into one of your veins. You may be given a medicine to help you relax (sedative). You may be given a medicine to make you fall asleep (general anesthetic). A thin, tube-shaped instrument with a light and tiny camera at the end (cystoscope) will be inserted into your urethra. The urethra is the tube that drains urine from the bladder out of the body. In men, the urethra opens at the end of the penis. In women, the urethra opens in front of the vaginal opening. The cystoscope will be passed into your bladder. A thin wire (guide wire) will be passed through your bladder and into your ureter. This is used to guide the stent into your ureter. The stent will be inserted into your  ureter. The guide wire and the cystoscope will be removed. A flexible tube (catheter) may be inserted through your urethra so that one end is in your bladder. This helps to drain urine from your bladder. The procedure may vary among hospitals and health care providers. What happens after the procedure? Your blood pressure, heart rate, breathing rate, and blood oxygen level will be monitored until you leave the hospital or clinic. You may continue to receive medicine and fluids through an IV. You may have some soreness or pain in your abdomen and urethra. Medicines will be available to help you. You will be encouraged to get up and walk around as soon as you can. You may have a catheter draining your urine. You will have some blood in your urine. Do not drive for 24 hours if you were given a sedative during your procedure. Summary Ureteral stent implantation is a procedure to insert a flexible, soft, plastic tube (stent) into a ureter. You may have a stent implanted to support the ureter while it heals after a procedure or to open the flow of urine if there is a blockage. Follow instructions from your health care provider about taking medicines and about eating and drinking before the procedure. Depending on  your condition, you may have a stent for just a few weeks, or you may have a long-term stent that will need to be replaced every few months. This information is not intended to replace advice given to you by your health care provider. Make sure you discuss any questions you have with your health care provider. Document Revised: 06/14/2018 Document Reviewed: 06/15/2018 Elsevier Patient Education  2022 [employer].

## 2021-11-12 ENCOUNTER — Telehealth: Payer: Self-pay | Admitting: Urology

## 2021-11-12 NOTE — Telephone Encounter (Signed)
Patient called the office today and left a voice mail message requesting a call back from Dr. Doristine Counter nurse.  I attempted to call the patient back to get more information, but did not get an answer.

## 2021-11-12 NOTE — Telephone Encounter (Signed)
Pt called again asking to speak to dr. Diamantina Providence. He restarted his blood thinner yesterday after surgery. He states since yesterday he has increased bright red , blood since yesterday. He denies any fever, chills, blood clots. Pt states he noticed more leakage. As per Dr. Diamantina Providence tell pt DC blood thinner and to increase his fluids, and if pt can not urinate and has more difficulty to call clinic in the morning. Pt aware and verbalized understanding.

## 2021-11-13 DIAGNOSIS — I340 Nonrheumatic mitral (valve) insufficiency: Secondary | ICD-10-CM | POA: Diagnosis not present

## 2021-11-13 DIAGNOSIS — I10 Essential (primary) hypertension: Secondary | ICD-10-CM | POA: Diagnosis not present

## 2021-11-13 DIAGNOSIS — I2510 Atherosclerotic heart disease of native coronary artery without angina pectoris: Secondary | ICD-10-CM | POA: Diagnosis not present

## 2021-11-13 DIAGNOSIS — E782 Mixed hyperlipidemia: Secondary | ICD-10-CM | POA: Diagnosis not present

## 2021-11-13 DIAGNOSIS — I351 Nonrheumatic aortic (valve) insufficiency: Secondary | ICD-10-CM | POA: Diagnosis not present

## 2021-11-19 ENCOUNTER — Ambulatory Visit
Admission: RE | Admit: 2021-11-19 | Discharge: 2021-11-19 | Disposition: A | Discharge status: Home / Self Care | Payer: Medicare HMO | Source: Ambulatory Visit | Attending: Urology | Admitting: Urology

## 2021-11-19 ENCOUNTER — Other Ambulatory Visit: Payer: Self-pay

## 2021-11-19 ENCOUNTER — Encounter: Payer: Self-pay | Admitting: Urology

## 2021-11-19 ENCOUNTER — Ambulatory Visit (INDEPENDENT_AMBULATORY_CARE_PROVIDER_SITE_OTHER): Payer: Medicare HMO | Admitting: Urology

## 2021-11-19 VITALS — BP 180/78 | HR 90 | Ht 69.0 in | Wt 181.6 lb

## 2021-11-19 DIAGNOSIS — R0602 Shortness of breath: Secondary | ICD-10-CM | POA: Diagnosis not present

## 2021-11-19 DIAGNOSIS — I517 Cardiomegaly: Secondary | ICD-10-CM | POA: Diagnosis not present

## 2021-11-19 DIAGNOSIS — Z466 Encounter for fitting and adjustment of urinary device: Secondary | ICD-10-CM

## 2021-11-19 NOTE — Progress Notes (Signed)
Cystoscopy Procedure Note: ? ?Indication: Stent removal s/p 11/07/2021 right ureteroscopy, laser lithotripsy, stent placement and HOLEP for BPH and recurrent gross hematuria ? ?After informed consent and discussion of the procedure and its risks, James [name] was positioned and prepped in the standard fashion.  Wide open prostatic fossa after HOLEP. Cystoscopy was performed with a flexible cystoscope. The stent was grasped with flexible graspers and removed in its entirety. The patient tolerated the procedure well. ? ?Findings: ?Uncomplicated stent removal ? ?Assessment and Plan: ?Follow-up as scheduled in 3 months for routine post HOLEP visit ? ? ?Billey Co, MD ?11/19/2021 ? ?

## 2021-11-20 LAB — URINALYSIS, COMPLETE
Bilirubin, UA: NEGATIVE
Glucose, UA: NEGATIVE
Ketones, UA: NEGATIVE
Nitrite, UA: NEGATIVE
Specific Gravity, UA: 1.025 (ref 1.005–1.030)
Urobilinogen, Ur: 0.2 mg/dL (ref 0.2–1.0)
pH, UA: 6.5 (ref 5.0–7.5)

## 2021-11-20 LAB — MICROSCOPIC EXAMINATION: RBC, Urine: >30 /hpf — ABNORMAL HIGH (ref 0–2)

## 2021-11-21 ENCOUNTER — Telehealth: Payer: Self-pay

## 2021-11-21 NOTE — Telephone Encounter (Signed)
Patient called regarding chest x-ray results ?

## 2021-11-21 NOTE — Telephone Encounter (Signed)
As per Dr. Diamantina Providence Chest x-ray has not been read by radiology yet, but on my read no obvious evidence of pneumonia or other abnormalities.  We will call when this has been read next week.  ? ?If he has worsening shortness of breath or chest pain he should present to urgent care or ER  ? ?Nickolas Madrid, MD  ?11/21/2021  ? ? ? ?Pt aware and is not feeling worse.  ?

## 2021-11-27 ENCOUNTER — Telehealth: Payer: Self-pay | Admitting: *Deleted

## 2021-11-27 NOTE — Telephone Encounter (Signed)
Pt calling stating that he has stopped and started xarelto several times and every time he re-start he continues to see bright red blood in his urine. Pt will follow-up with his cardiologist on Monday 12/01/21 and will stay off the xarelto until then. ? ?Per verbal from Dr. Diamantina Providence advise pt to hold xarelto until he see cardiology. ? ?Spoke with patient and advised results ? ? ?

## 2021-12-01 ENCOUNTER — Telehealth: Payer: Self-pay

## 2021-12-01 DIAGNOSIS — I340 Nonrheumatic mitral (valve) insufficiency: Secondary | ICD-10-CM | POA: Diagnosis not present

## 2021-12-01 DIAGNOSIS — R9431 Abnormal electrocardiogram [ECG] [EKG]: Secondary | ICD-10-CM | POA: Diagnosis not present

## 2021-12-01 DIAGNOSIS — I2510 Atherosclerotic heart disease of native coronary artery without angina pectoris: Secondary | ICD-10-CM | POA: Diagnosis not present

## 2021-12-01 DIAGNOSIS — I351 Nonrheumatic aortic (valve) insufficiency: Secondary | ICD-10-CM | POA: Diagnosis not present

## 2021-12-01 DIAGNOSIS — I10 Essential (primary) hypertension: Secondary | ICD-10-CM | POA: Diagnosis not present

## 2021-12-01 DIAGNOSIS — E782 Mixed hyperlipidemia: Secondary | ICD-10-CM | POA: Diagnosis not present

## 2021-12-01 NOTE — Telephone Encounter (Signed)
This is likely from his scab healing and should continue to improve, encouraged him to continue to drink plenty of fluids, keep follow-up as scheduled.  He should let us know if no improvement over the next 3-4 ? ?Nickolas Madrid, MD ?12/01/2021 ? ?

## 2021-12-01 NOTE — Telephone Encounter (Signed)
Incoming call on triage line, pt states that today he has seen a significant increase in hematuria. Denies clots, fever, chills, and pain. Denies any strenuous activity preceding episode of bleeding. He has not resumed Xarelto. He questions how long he should expect bleeding as he was originally told 10-14 days. Please advise.  ?

## 2021-12-02 NOTE — Telephone Encounter (Signed)
Called pt informed him of the information below. Pt voiced understanding. States bleeding is better today.  ?

## 2021-12-11 DIAGNOSIS — S42201A Unspecified fracture of upper end of right humerus, initial encounter for closed fracture: Secondary | ICD-10-CM | POA: Diagnosis not present

## 2021-12-11 DIAGNOSIS — I10 Essential (primary) hypertension: Secondary | ICD-10-CM | POA: Diagnosis not present

## 2021-12-11 DIAGNOSIS — E782 Mixed hyperlipidemia: Secondary | ICD-10-CM | POA: Diagnosis not present

## 2021-12-11 DIAGNOSIS — S43001A Unspecified subluxation of right shoulder joint, initial encounter: Secondary | ICD-10-CM | POA: Diagnosis not present

## 2021-12-11 DIAGNOSIS — I2510 Atherosclerotic heart disease of native coronary artery without angina pectoris: Secondary | ICD-10-CM | POA: Diagnosis not present

## 2022-01-01 DIAGNOSIS — Z0100 Encounter for examination of eyes and vision without abnormal findings: Secondary | ICD-10-CM | POA: Diagnosis not present

## 2022-01-05 DIAGNOSIS — S42201A Unspecified fracture of upper end of right humerus, initial encounter for closed fracture: Secondary | ICD-10-CM | POA: Diagnosis not present

## 2022-01-08 ENCOUNTER — Ambulatory Visit
Admission: RE | Admit: 2022-01-08 | Discharge: 2022-01-08 | Disposition: A | Discharge status: Home / Self Care | Payer: Medicare HMO | Source: Ambulatory Visit | Attending: Internal Medicine | Admitting: Internal Medicine

## 2022-01-08 ENCOUNTER — Other Ambulatory Visit: Payer: Self-pay | Admitting: Internal Medicine

## 2022-01-08 ENCOUNTER — Ambulatory Visit
Admission: RE | Admit: 2022-01-08 | Discharge: 2022-01-08 | Disposition: A | Discharge status: Home / Self Care | Payer: Medicare HMO | Attending: Internal Medicine | Admitting: Internal Medicine

## 2022-01-08 DIAGNOSIS — E782 Mixed hyperlipidemia: Secondary | ICD-10-CM | POA: Diagnosis not present

## 2022-01-08 DIAGNOSIS — M25511 Pain in right shoulder: Secondary | ICD-10-CM | POA: Diagnosis not present

## 2022-01-08 DIAGNOSIS — R52 Pain, unspecified: Secondary | ICD-10-CM | POA: Diagnosis not present

## 2022-01-08 DIAGNOSIS — M25611 Stiffness of right shoulder, not elsewhere classified: Secondary | ICD-10-CM | POA: Diagnosis not present

## 2022-01-08 DIAGNOSIS — M79671 Pain in right foot: Secondary | ICD-10-CM | POA: Diagnosis not present

## 2022-01-08 DIAGNOSIS — I2510 Atherosclerotic heart disease of native coronary artery without angina pectoris: Secondary | ICD-10-CM | POA: Diagnosis not present

## 2022-01-08 DIAGNOSIS — M7989 Other specified soft tissue disorders: Secondary | ICD-10-CM | POA: Diagnosis not present

## 2022-01-08 DIAGNOSIS — I10 Essential (primary) hypertension: Secondary | ICD-10-CM | POA: Diagnosis not present

## 2022-01-08 DIAGNOSIS — M109 Gout, unspecified: Secondary | ICD-10-CM | POA: Diagnosis not present

## 2022-01-15 DIAGNOSIS — I2510 Atherosclerotic heart disease of native coronary artery without angina pectoris: Secondary | ICD-10-CM | POA: Diagnosis not present

## 2022-01-15 DIAGNOSIS — R69 Illness, unspecified: Secondary | ICD-10-CM | POA: Diagnosis not present

## 2022-01-15 DIAGNOSIS — I10 Essential (primary) hypertension: Secondary | ICD-10-CM | POA: Diagnosis not present

## 2022-01-15 DIAGNOSIS — E782 Mixed hyperlipidemia: Secondary | ICD-10-CM | POA: Diagnosis not present

## 2022-01-16 DIAGNOSIS — I351 Nonrheumatic aortic (valve) insufficiency: Secondary | ICD-10-CM | POA: Diagnosis not present

## 2022-01-16 DIAGNOSIS — I2510 Atherosclerotic heart disease of native coronary artery without angina pectoris: Secondary | ICD-10-CM | POA: Diagnosis not present

## 2022-01-16 DIAGNOSIS — R55 Syncope and collapse: Secondary | ICD-10-CM | POA: Diagnosis not present

## 2022-01-16 DIAGNOSIS — R0602 Shortness of breath: Secondary | ICD-10-CM | POA: Diagnosis not present

## 2022-01-16 DIAGNOSIS — R609 Edema, unspecified: Secondary | ICD-10-CM | POA: Diagnosis not present

## 2022-01-16 DIAGNOSIS — I340 Nonrheumatic mitral (valve) insufficiency: Secondary | ICD-10-CM | POA: Diagnosis not present

## 2022-01-16 DIAGNOSIS — E782 Mixed hyperlipidemia: Secondary | ICD-10-CM | POA: Diagnosis not present

## 2022-01-16 DIAGNOSIS — I10 Essential (primary) hypertension: Secondary | ICD-10-CM | POA: Diagnosis not present

## 2022-01-22 DIAGNOSIS — M25611 Stiffness of right shoulder, not elsewhere classified: Secondary | ICD-10-CM | POA: Diagnosis not present

## 2022-01-22 DIAGNOSIS — M25511 Pain in right shoulder: Secondary | ICD-10-CM | POA: Diagnosis not present

## 2022-01-26 DIAGNOSIS — I2510 Atherosclerotic heart disease of native coronary artery without angina pectoris: Secondary | ICD-10-CM | POA: Diagnosis not present

## 2022-01-28 DIAGNOSIS — R42 Dizziness and giddiness: Secondary | ICD-10-CM | POA: Diagnosis not present

## 2022-01-29 ENCOUNTER — Ambulatory Visit: Payer: Medicare HMO | Admitting: Urology

## 2022-01-29 ENCOUNTER — Encounter: Payer: Self-pay | Admitting: Urology

## 2022-01-29 VITALS — BP 192/92 | HR 79 | Ht 69.0 in | Wt 174.0 lb

## 2022-01-29 DIAGNOSIS — N200 Calculus of kidney: Secondary | ICD-10-CM

## 2022-01-29 DIAGNOSIS — N138 Other obstructive and reflux uropathy: Secondary | ICD-10-CM

## 2022-01-29 DIAGNOSIS — N401 Enlarged prostate with lower urinary tract symptoms: Secondary | ICD-10-CM | POA: Diagnosis not present

## 2022-01-29 DIAGNOSIS — Z87442 Personal history of urinary calculi: Secondary | ICD-10-CM

## 2022-01-29 LAB — BLADDER SCAN AMB NON-IMAGING: Scan Result: 3

## 2022-01-29 MED ORDER — MIRABEGRON ER 50 MG PO TB24: 50.0000 mg | ORAL_TABLET | Freq: Every day | ORAL | 0 refills | Status: DC

## 2022-01-29 NOTE — Progress Notes (Signed)
? ?  01/29/2022 ?9:42 AM  ? ?DEBRA CALABRETTA ?1936-03-05 ?[id number] ? ?Reason for visit: Follow up BPH and gross hematuria status post HOLEP, right ureteral stone ? ?HPI: ?86 year old male with a long history of BPH on maximal medical therapy with finasteride and doxazosin who has had ongoing problems with recurrent gross hematuria felt to be secondary to BPH, and urinary symptoms of weak stream, urgency, and frequency.  He also had a large right renal pelvis stone that was causing intermittent flank pain. ? ?He ultimately underwent a right ureteroscopy, laser lithotripsy, and stent placement for the right-sided stone that it actually migrated into the distal ureter, with simultaneous HOLEP and removal of 21 g of benign tissue.  Stent has since been removed.  He had some problems with ongoing gross hematuria for a few weeks postop on his Xarelto, but that has since resolved.  He actually discontinued the Xarelto as he felt he did not need to be on that medication anymore. ? ?Overall he is doing well.  His right flank pain has resolved, and he denies any recurrent gross hematuria.  He still having some frequency, as well as nocturia 3 times per night.  This has improved from prior to surgery.  He is having some mild incontinence that is both stress and urge related.  We discussed postop expectations after HOLEP, and I encouraged him to work on Kegel exercises, and avoid bladder irritants.  I think it is also worth a trial of 1 month of Myrbetriq as his bladder adjust.  PVR is normal today at 3 mL. ? ?-Trial of Myrbetriq samples during the healing process after HOLEP, can continue long-term if this is helpful ?-Instructed on Kegel exercises ?-RTC 2 months UA, PVR, symptom check ? ?Billey Co, MD ? ?[facility] ?[address] ?[place], [place] [address] ?[phone] ? ? ?

## 2022-01-29 NOTE — Patient Instructions (Signed)
Kegel Exercises  Kegel exercises can help strengthen your pelvic floor muscles. The pelvic floor is a group of muscles that support your rectum, small intestine, and bladder. In females, pelvic floor muscles also help support the uterus. These muscles help you control the flow of urine and stool (feces). Kegel exercises are painless and simple. They do not require any equipment. Your provider may suggest Kegel exercises to: Improve bladder and bowel control. Improve sexual response. Improve weak pelvic floor muscles after surgery to remove the uterus (hysterectomy) or after pregnancy, in females. Improve weak pelvic floor muscles after prostate gland removal or surgery, in males. Kegel exercises involve squeezing your pelvic floor muscles. These are the same muscles you squeeze when you try to stop the flow of urine or keep from passing gas. The exercises can be done while sitting, standing, or lying down, but it is best to vary your position. Ask your health care provider which exercises are safe for you. Do exercises exactly as told by your health care provider and adjust them as directed. Do not begin these exercises until told by your health care provider. Exercises How to do Kegel exercises: Squeeze your pelvic floor muscles tight. You should feel a tight lift in your rectal area. If you are a male, you should also feel a tightness in your vaginal area. Keep your stomach, buttocks, and legs relaxed. Hold the muscles tight for up to 10 seconds. Breathe normally. Relax your muscles for up to 10 seconds. Repeat as told by your health care provider. Repeat this exercise daily as told by your health care provider. Continue to do this exercise for at least 4-6 weeks, or for as long as told by your health care provider. You may be referred to a physical therapist who can help you learn more about how to do Kegel exercises. Depending on your condition, your health care provider may  recommend: Varying how long you squeeze your muscles. Doing several sets of exercises every day. Doing exercises for several weeks. Making Kegel exercises a part of your regular exercise routine. This information is not intended to replace advice given to you by your health care provider. Make sure you discuss any questions you have with your health care provider. Document Revised: 01/16/2021 Document Reviewed: 01/16/2021 Elsevier Patient Education  2023 Elsevier Inc.  

## 2022-02-02 DIAGNOSIS — M25511 Pain in right shoulder: Secondary | ICD-10-CM | POA: Diagnosis not present

## 2022-02-02 DIAGNOSIS — R55 Syncope and collapse: Secondary | ICD-10-CM | POA: Diagnosis not present

## 2022-02-02 DIAGNOSIS — R42 Dizziness and giddiness: Secondary | ICD-10-CM | POA: Diagnosis not present

## 2022-02-02 DIAGNOSIS — M25611 Stiffness of right shoulder, not elsewhere classified: Secondary | ICD-10-CM | POA: Diagnosis not present

## 2022-02-05 DIAGNOSIS — I2510 Atherosclerotic heart disease of native coronary artery without angina pectoris: Secondary | ICD-10-CM | POA: Diagnosis not present

## 2022-02-05 DIAGNOSIS — E782 Mixed hyperlipidemia: Secondary | ICD-10-CM | POA: Diagnosis not present

## 2022-02-05 DIAGNOSIS — I351 Nonrheumatic aortic (valve) insufficiency: Secondary | ICD-10-CM | POA: Diagnosis not present

## 2022-02-05 DIAGNOSIS — I340 Nonrheumatic mitral (valve) insufficiency: Secondary | ICD-10-CM | POA: Diagnosis not present

## 2022-02-05 DIAGNOSIS — M25511 Pain in right shoulder: Secondary | ICD-10-CM | POA: Diagnosis not present

## 2022-02-05 DIAGNOSIS — I10 Essential (primary) hypertension: Secondary | ICD-10-CM | POA: Diagnosis not present

## 2022-02-05 DIAGNOSIS — M25611 Stiffness of right shoulder, not elsewhere classified: Secondary | ICD-10-CM | POA: Diagnosis not present

## 2022-02-12 DIAGNOSIS — M25611 Stiffness of right shoulder, not elsewhere classified: Secondary | ICD-10-CM | POA: Diagnosis not present

## 2022-02-12 DIAGNOSIS — M25511 Pain in right shoulder: Secondary | ICD-10-CM | POA: Diagnosis not present

## 2022-02-26 DIAGNOSIS — I2510 Atherosclerotic heart disease of native coronary artery without angina pectoris: Secondary | ICD-10-CM | POA: Diagnosis not present

## 2022-02-26 DIAGNOSIS — I10 Essential (primary) hypertension: Secondary | ICD-10-CM | POA: Diagnosis not present

## 2022-02-26 DIAGNOSIS — N400 Enlarged prostate without lower urinary tract symptoms: Secondary | ICD-10-CM | POA: Diagnosis not present

## 2022-02-26 DIAGNOSIS — E782 Mixed hyperlipidemia: Secondary | ICD-10-CM | POA: Diagnosis not present

## 2022-02-27 DIAGNOSIS — E782 Mixed hyperlipidemia: Secondary | ICD-10-CM | POA: Diagnosis not present

## 2022-02-27 DIAGNOSIS — I10 Essential (primary) hypertension: Secondary | ICD-10-CM | POA: Diagnosis not present

## 2022-02-27 DIAGNOSIS — I2510 Atherosclerotic heart disease of native coronary artery without angina pectoris: Secondary | ICD-10-CM | POA: Diagnosis not present

## 2022-03-20 DIAGNOSIS — E782 Mixed hyperlipidemia: Secondary | ICD-10-CM | POA: Diagnosis not present

## 2022-03-20 DIAGNOSIS — I2510 Atherosclerotic heart disease of native coronary artery without angina pectoris: Secondary | ICD-10-CM | POA: Diagnosis not present

## 2022-03-20 DIAGNOSIS — I10 Essential (primary) hypertension: Secondary | ICD-10-CM | POA: Diagnosis not present

## 2022-04-09 ENCOUNTER — Ambulatory Visit: Payer: Medicare HMO | Admitting: Urology

## 2022-04-09 ENCOUNTER — Encounter: Payer: Self-pay | Admitting: Urology

## 2022-04-09 VITALS — BP 165/93 | HR 53 | Ht 69.0 in

## 2022-04-09 DIAGNOSIS — N401 Enlarged prostate with lower urinary tract symptoms: Secondary | ICD-10-CM

## 2022-04-09 DIAGNOSIS — N138 Other obstructive and reflux uropathy: Secondary | ICD-10-CM

## 2022-04-09 DIAGNOSIS — N393 Stress incontinence (female) (male): Secondary | ICD-10-CM

## 2022-04-09 LAB — BLADDER SCAN AMB NON-IMAGING

## 2022-04-09 NOTE — Progress Notes (Signed)
   04/09/2022 11:15 AM   [name] 1936-09-20 [id number]  Reason for visit: Follow up BPH status post HOLEP, nephrolithiasis, history of gross hematuria  HPI: 86 year old male with a long history of BPH on maximal medical therapy with finasteride and doxazosin who has had ongoing problems with recurrent gross hematuria felt to be secondary to BPH, and urinary symptoms of weak stream, urgency, and frequency.  He also had a large right renal pelvis stone that was causing intermittent flank pain.  In February 2023 he ultimately underwent a right ureteroscopy, laser lithotripsy, and stent placement for the right-sided stone that had actually migrated into the distal ureter, with simultaneous HOLEP and removal of 21 g of benign tissue.  Stent has since been removed.  He had some problems with ongoing gross hematuria for a few weeks postop on his Xarelto, but that has since resolved.  He actually discontinued the Xarelto as he felt he did not need to be on that medication anymore.  Overall he is doing well.  His right flank pain has resolved, and he denies any recurrent gross hematuria.  At our last visit he was still having some urgency and frequency, and we trialed 1 month of Myrbetriq which only mildly improved his symptoms.  He really denies any urgency or frequency today, currently having nocturia 1-2 times overnight, and only bothersome symptom is some mild stress incontinence with movement that requires a pad.  He has been doing some lower leg exercises, but does not sound like he has been working on the Kegel exercises.  Bladder scan today 120 mL but he denies the urge to void.  -Reassurance provided about postop expectations after HOLEP, encouraged to work on Kegel exercises, avoid bladder irritants, and referral placed to pelvic floor physical therapy. -RTC 9 months PVR  Billey Co, [place] [address] [place], [place]  [address] [phone]

## 2022-04-09 NOTE — Patient Instructions (Signed)
Kegel Exercises  Kegel exercises can help strengthen your pelvic floor muscles. The pelvic floor is a group of muscles that support your rectum, small intestine, and bladder. In females, pelvic floor muscles also help support the uterus. These muscles help you control the flow of urine and stool (feces). Kegel exercises are painless and simple. They do not require any equipment. Your provider may suggest Kegel exercises to: Improve bladder and bowel control. Improve sexual response. Improve weak pelvic floor muscles after surgery to remove the uterus (hysterectomy) or after pregnancy, in females. Improve weak pelvic floor muscles after prostate gland removal or surgery, in males. Kegel exercises involve squeezing your pelvic floor muscles. These are the same muscles you squeeze when you try to stop the flow of urine or keep from passing gas. The exercises can be done while sitting, standing, or lying down, but it is best to vary your position. Ask your health care provider which exercises are safe for you. Do exercises exactly as told by your health care provider and adjust them as directed. Do not begin these exercises until told by your health care provider. Exercises How to do Kegel exercises: Squeeze your pelvic floor muscles tight. You should feel a tight lift in your rectal area. If you are a male, you should also feel a tightness in your vaginal area. Keep your stomach, buttocks, and legs relaxed. Hold the muscles tight for up to 10 seconds. Breathe normally. Relax your muscles for up to 10 seconds. Repeat as told by your health care provider. Repeat this exercise daily as told by your health care provider. Continue to do this exercise for at least 4-6 weeks, or for as long as told by your health care provider. You may be referred to a physical therapist who can help you learn more about how to do Kegel exercises. Depending on your condition, your health care provider may  recommend: Varying how long you squeeze your muscles. Doing several sets of exercises every day. Doing exercises for several weeks. Making Kegel exercises a part of your regular exercise routine. This information is not intended to replace advice given to you by your health care provider. Make sure you discuss any questions you have with your health care provider. Document Revised: 01/16/2021 Document Reviewed: 01/16/2021 Elsevier Patient Education  2023 Elsevier Inc.  

## 2022-04-20 DIAGNOSIS — I10 Essential (primary) hypertension: Secondary | ICD-10-CM | POA: Diagnosis not present

## 2022-04-20 DIAGNOSIS — E782 Mixed hyperlipidemia: Secondary | ICD-10-CM | POA: Diagnosis not present

## 2022-04-20 DIAGNOSIS — I2510 Atherosclerotic heart disease of native coronary artery without angina pectoris: Secondary | ICD-10-CM | POA: Diagnosis not present

## 2022-04-23 ENCOUNTER — Ambulatory Visit: Payer: Medicare HMO | Admitting: Dermatology

## 2022-05-11 ENCOUNTER — Ambulatory Visit: Payer: Medicare HMO | Admitting: Dermatology

## 2022-05-11 DIAGNOSIS — L570 Actinic keratosis: Secondary | ICD-10-CM

## 2022-05-11 DIAGNOSIS — Z85828 Personal history of other malignant neoplasm of skin: Secondary | ICD-10-CM | POA: Diagnosis not present

## 2022-05-11 DIAGNOSIS — L814 Other melanin hyperpigmentation: Secondary | ICD-10-CM

## 2022-05-11 DIAGNOSIS — L821 Other seborrheic keratosis: Secondary | ICD-10-CM | POA: Diagnosis not present

## 2022-05-11 DIAGNOSIS — D1800 Hemangioma unspecified site: Secondary | ICD-10-CM

## 2022-05-11 DIAGNOSIS — Z1283 Encounter for screening for malignant neoplasm of skin: Secondary | ICD-10-CM

## 2022-05-11 DIAGNOSIS — D229 Melanocytic nevi, unspecified: Secondary | ICD-10-CM | POA: Diagnosis not present

## 2022-05-11 DIAGNOSIS — L578 Other skin changes due to chronic exposure to nonionizing radiation: Secondary | ICD-10-CM

## 2022-05-11 NOTE — Patient Instructions (Signed)
Due to recent changes in healthcare laws, you may see results of your pathology and/or laboratory studies on MyChart before the doctors have had a chance to review them. We understand that in some cases there may be results that are confusing or concerning to you. Please understand that not all results are received at the same time and often the doctors may need to interpret multiple results in order to provide you with the best plan of care or course of treatment. Therefore, we ask that you please give us 2 business days to thoroughly review all your results before contacting the office for clarification. Should we see a critical lab result, you will be contacted sooner.   If You Need Anything After Your Visit  If you have any questions or concerns for your doctor, please call our main line at [phone] and press option 4 to reach your doctor's medical assistant. If no one answers, please leave a voicemail as directed and we will return your call as soon as possible. Messages left after 4 pm will be answered the following business day.   You may also send us a message via MyChart. We typically respond to MyChart messages within 1-2 business days.  For prescription refills, please ask your pharmacy to contact our office. Our fax number is [phone].  If you have an urgent issue when the clinic is closed that cannot wait until the next business day, you can page your doctor at the number below.    Please note that while we do our best to be available for urgent issues outside of office hours, we are not available 24/7.   If you have an urgent issue and are unable to reach us, you may choose to seek medical care at your doctor's office, retail clinic, urgent care center, or emergency room.  If you have a medical emergency, please immediately call 911 or go to the emergency department.  Pager Numbers  - Dr. Kowalski: [phone]  - Dr. Moye: [phone]  - Dr. Stewart:  [phone]  In the event of inclement weather, please call our main line at [phone] for an update on the status of any delays or closures.  Dermatology Medication Tips: Please keep the boxes that topical medications come in in order to help keep track of the instructions about where and how to use these. Pharmacies typically print the medication instructions only on the boxes and not directly on the medication tubes.   If your medication is too expensive, please contact our office at [phone] option 4 or send us a message through MyChart.   We are unable to tell what your co-pay for medications will be in advance as this is different depending on your insurance coverage. However, we may be able to find a substitute medication at lower cost or fill out paperwork to get insurance to cover a needed medication.   If a prior authorization is required to get your medication covered by your insurance company, please allow us 1-2 business days to complete this process.  Drug prices often vary depending on where the prescription is filled and some pharmacies may offer cheaper prices.  The website www.goodrx.com contains coupons for medications through different pharmacies. The prices here do not account for what the cost may be with help from insurance (it may be cheaper with your insurance), but the website can give you the price if you did not use any insurance.  - You can print the associated coupon and take it with   your prescription to the pharmacy.  - You may also stop by our office during regular business hours and pick up a GoodRx coupon card.  - If you need your prescription sent electronically to a different pharmacy, notify our office through [facility] MyChart or by phone at [phone] option 4.     Si Usted Necesita Algo Despus de Su Visita  Tambin puede enviarnos un mensaje a travs de MyChart. Por lo general respondemos a los mensajes de MyChart en el transcurso de 1 a 2  das hbiles.  Para renovar recetas, por favor pida a su farmacia que se ponga en contacto con nuestra oficina. Nuestro nmero de fax es el [phone].  Si tiene un asunto urgente cuando la clnica est cerrada y que no puede esperar hasta el siguiente da hbil, puede llamar/localizar a su doctor(a) al nmero que aparece a continuacin.   Por favor, tenga en cuenta que aunque hacemos todo lo posible para estar disponibles para asuntos urgentes fuera del horario de oficina, no estamos disponibles las 24 horas del da, los 7 das de la semana.   Si tiene un problema urgente y no puede comunicarse con nosotros, puede optar por buscar atencin mdica  en el consultorio de su doctor(a), en una clnica privada, en un centro de atencin urgente o en una sala de emergencias.  Si tiene una emergencia mdica, por favor llame inmediatamente al 911 o vaya a la sala de emergencias.  Nmeros de bper  - Dr. Kowalski: [phone]  - Dra. Moye: [phone]  - Dra. Stewart: [phone]  En caso de inclemencias del tiempo, por favor llame a nuestra lnea principal al [phone] para una actualizacin sobre el estado de cualquier retraso o cierre.  Consejos para la medicacin en dermatologa: Por favor, guarde las cajas en las que vienen los medicamentos de uso tpico para ayudarle a seguir las instrucciones sobre dnde y cmo usarlos. Las farmacias generalmente imprimen las instrucciones del medicamento slo en las cajas y no directamente en los tubos del medicamento.   Si su medicamento es muy caro, por favor, pngase en contacto con nuestra oficina llamando al [phone] y presione la opcin 4 o envenos un mensaje a travs de MyChart.   No podemos decirle cul ser su copago por los medicamentos por adelantado ya que esto es diferente dependiendo de la cobertura de su seguro. Sin embargo, es posible que podamos encontrar un medicamento sustituto a menor costo o llenar un formulario para que el  seguro cubra el medicamento que se considera necesario.   Si se requiere una autorizacin previa para que su compaa de seguros cubra su medicamento, por favor permtanos de 1 a 2 das hbiles para completar este proceso.  Los precios de los medicamentos varan con frecuencia dependiendo del lugar de dnde se surte la receta y alguna farmacias pueden ofrecer precios ms baratos.  El sitio web www.goodrx.com tiene cupones para medicamentos de diferentes farmacias. Los precios aqu no tienen en cuenta lo que podra costar con la ayuda del seguro (puede ser ms barato con su seguro), pero el sitio web puede darle el precio si no utiliz ningn seguro.  - Puede imprimir el cupn correspondiente y llevarlo con su receta a la farmacia.  - Tambin puede pasar por nuestra oficina durante el horario de atencin regular y recoger una tarjeta de cupones de GoodRx.  - Si necesita que su receta se enve electrnicamente a una farmacia diferente, informe a nuestra oficina a travs de MyChart de [facility]   o por telfono llamando al [phone] y presione la opcin 4.  

## 2022-05-11 NOTE — Progress Notes (Signed)
   Follow-Up Visit   Subjective  James [name] is a 86 y.o. male who presents for the following: Actinic Keratosis (Of the face and scalp - previously treated with LN2) and ISK follow up  (Of the face - previously treated with LN2). The patient presents for Upper Body Skin Exam (UBSE) for skin cancer screening and mole check.  The patient has spots, moles and lesions to be evaluated, some may be new or changing and the patient has concerns that these could be cancer.  The following portions of the chart were reviewed this encounter and updated as appropriate:   Tobacco  Allergies  Meds  Problems  Med Hx  Surg Hx  Fam Hx     Review of Systems:  No other skin or systemic complaints except as noted in HPI or Assessment and Plan.  Objective  Well appearing patient in no apparent distress; mood and affect are within normal limits.  All skin waist up examined.  Forehead and scalp x 8 (8) Erythematous thin papules/macules with gritty scale.    Assessment & Plan  AK (actinic keratosis) (8) Forehead and scalp x 8  Destruction of lesion - Forehead and scalp x 8 Complexity: simple   Destruction method: cryotherapy   Informed consent: discussed and consent obtained   Timeout:  patient name, date of birth, surgical site, and procedure verified Lesion destroyed using liquid nitrogen: Yes   Region frozen until ice ball extended beyond lesion: Yes   Outcome: patient tolerated procedure well with no complications   Post-procedure details: wound care instructions given    Lentigines - Scattered tan macules - Due to sun exposure - Benign-appearing, observe - Recommend daily broad spectrum sunscreen SPF 30+ to sun-exposed areas, reapply every 2 hours as needed. - Call for any changes  Seborrheic Keratoses - Stuck-on, waxy, tan-brown papules and/or plaques  - Benign-appearing - Discussed benign etiology and prognosis. - Observe - Call for any changes  Melanocytic Nevi -  Tan-brown and/or pink-flesh-colored symmetric macules and papules - Benign appearing on exam today - Observation - Call clinic for new or changing moles - Recommend daily use of broad spectrum spf 30+ sunscreen to sun-exposed areas.   Hemangiomas - Red papules - Discussed benign nature - Observe - Call for any changes  Actinic Damage - Chronic condition, secondary to cumulative UV/sun exposure - diffuse scaly erythematous macules with underlying dyspigmentation - Recommend daily broad spectrum sunscreen SPF 30+ to sun-exposed areas, reapply every 2 hours as needed.  - Staying in the shade or wearing long sleeves, sun glasses (UVA+UVB protection) and wide brim hats (4-inch brim around the entire circumference of the hat) are also recommended for sun protection.  - Call for new or changing lesions.  History of Basal Cell Carcinoma of the Skin - No evidence of recurrence today - Recommend regular full body skin exams - Recommend daily broad spectrum sunscreen SPF 30+ to sun-exposed areas, reapply every 2 hours as needed.  - Call if any new or changing lesions are noted between office visits  Skin cancer screening performed today.  Return in about 6 months (around 11/11/2022) for AK and ISK follow up .  Luther Redo, CMA, am acting as scribe for Sarina Ser, MD . Documentation: I have reviewed the above documentation for accuracy and completeness, and I agree with the above.  Sarina Ser, MD

## 2022-05-15 ENCOUNTER — Encounter: Payer: Self-pay | Admitting: Dermatology

## 2022-05-18 IMAGING — CT CT ABD-PEL WO/W CM
4 of 12 series · 11 of 46 positions shown, 17 images · IV contrast (OMNIPAQUE)
Comparison: None.

CLINICAL DATA: Intermittent gross hematuria for 1 month.

EXAM:
CT ABDOMEN AND PELVIS WITHOUT AND WITH CONTRAST
TECHNIQUE: Multidetector CT imaging of the abdomen and pelvis was performed
following the standard protocol before and following the bolus
administration of intravenous contrast.
CONTRAST:  100mL OMNIPAQUE IOHEXOL 300 MG/ML  SOLN

[Series 2: axial post · axial · 0.72mm/px · z∈[-553,-268]mm · 4 of 95 slices shown]
[im 19/95  soft-tissue]
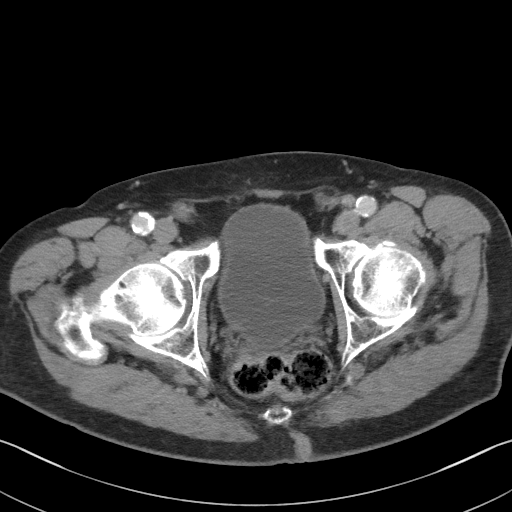
[im 38/95  soft-tissue]
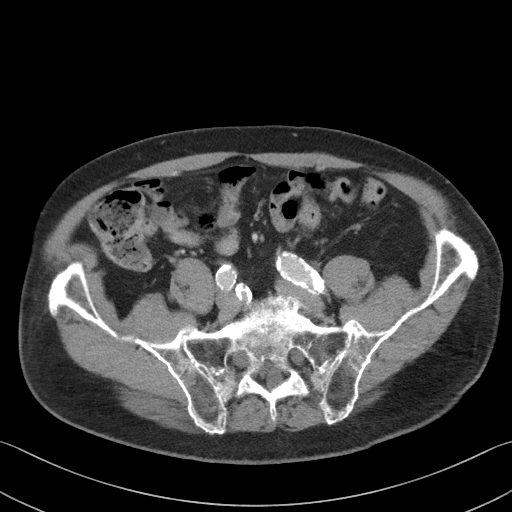
[im 57/95  soft-tissue]
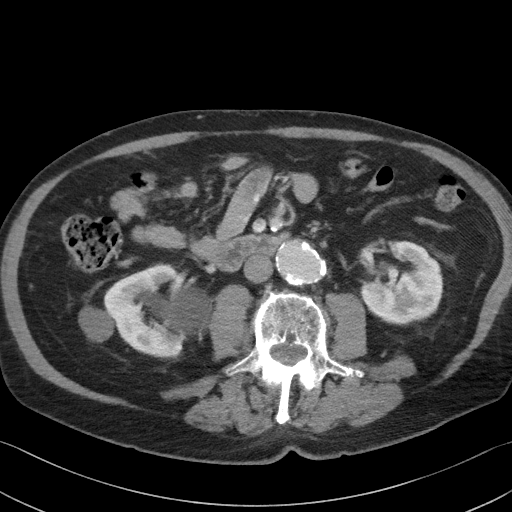
[im 76/95  soft-tissue]
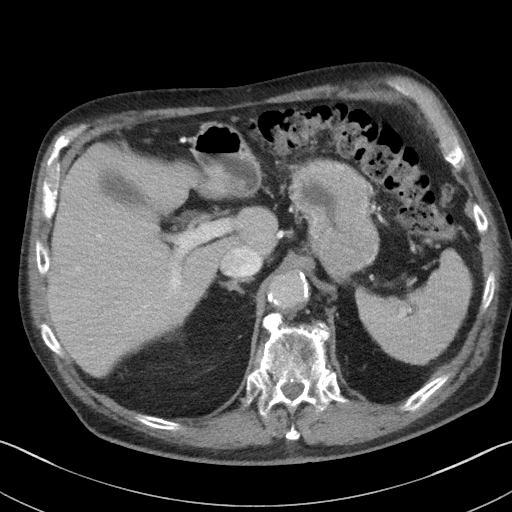

[Series 4: axial pre · axial · non-contrast · 0.81mm/px · z∈[-548,-453]mm · 2 of 94 slices shown]
[im 19/94  soft-tissue]
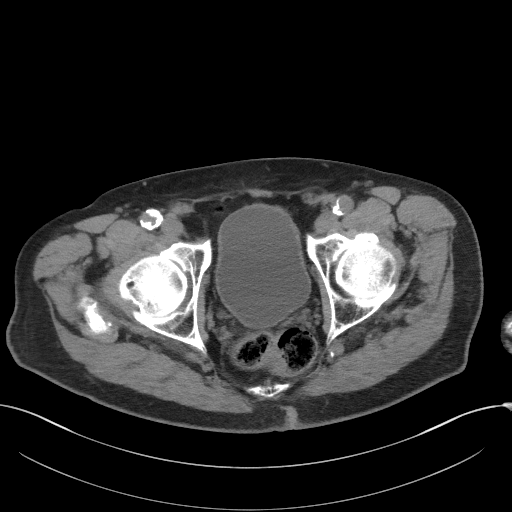
[im 38/94  soft-tissue]
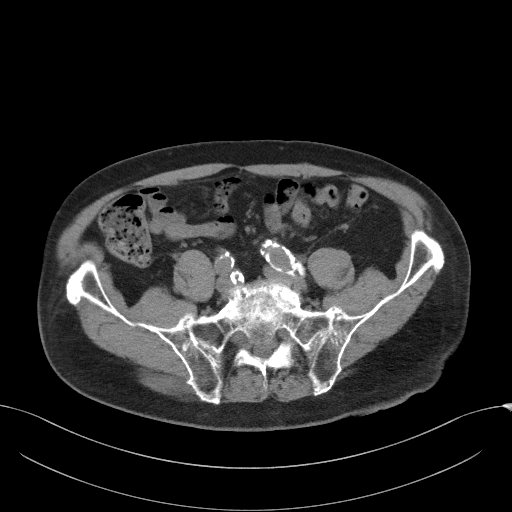

[Series 14: axial delay · axial · delayed · 0.81mm/px · z∈[-637,-322]mm · 4 of 107 slices shown, 9 images]
[im 22/107  soft-tissue]
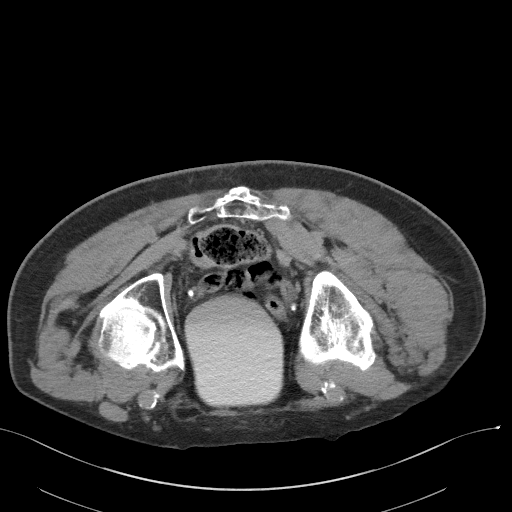
[im 22/107  lung]
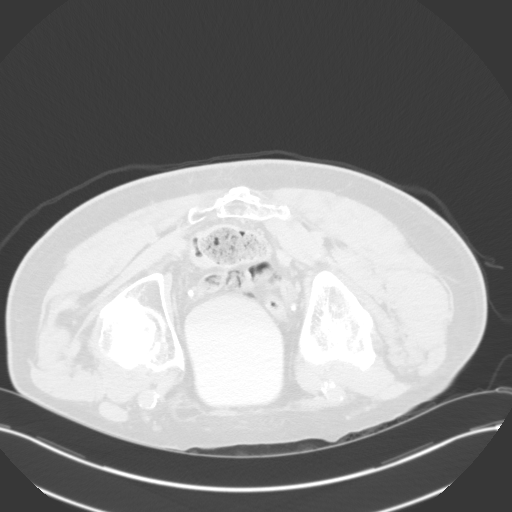
[im 22/107  bone]
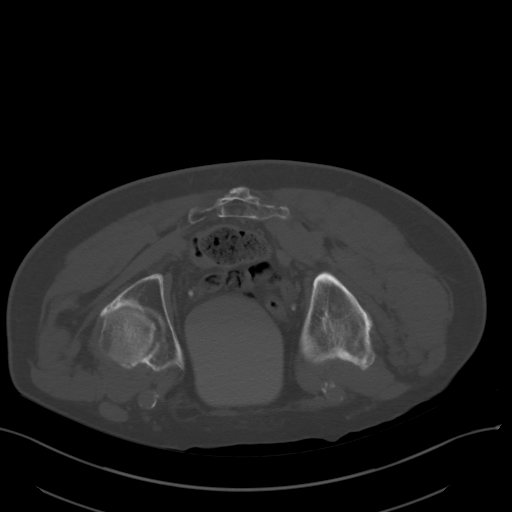
[im 43/107  soft-tissue]
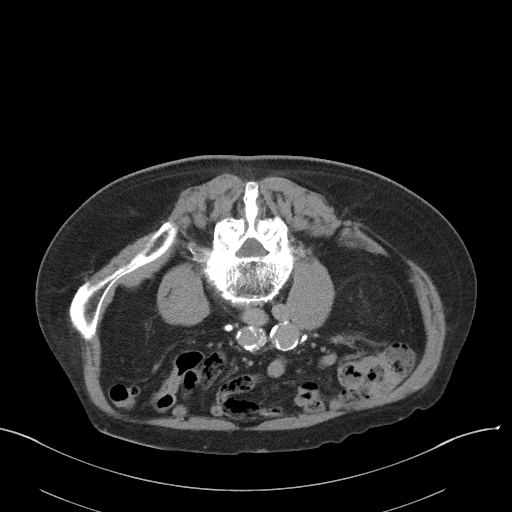
[im 43/107  lung]
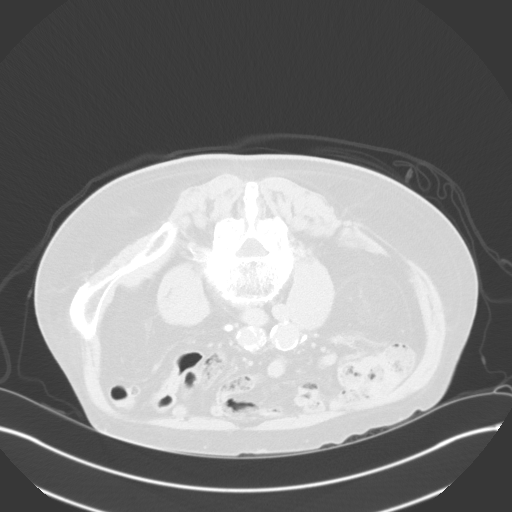
[im 64/107  soft-tissue]
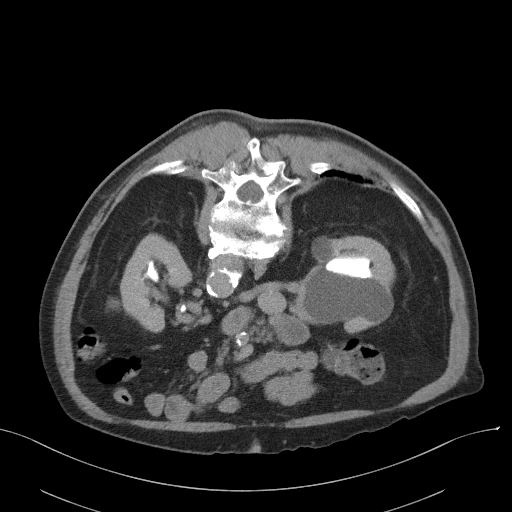
[im 64/107  lung]
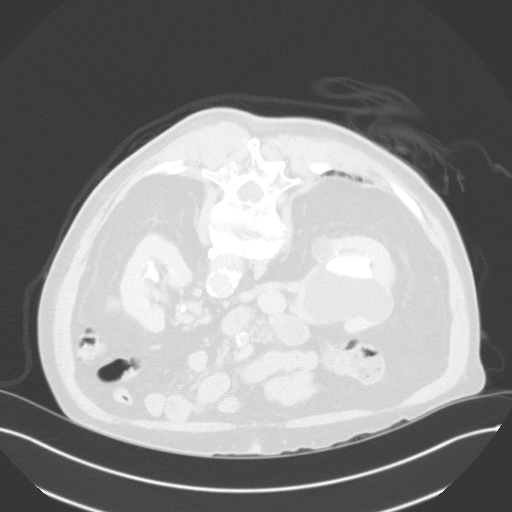
[im 85/107  soft-tissue]
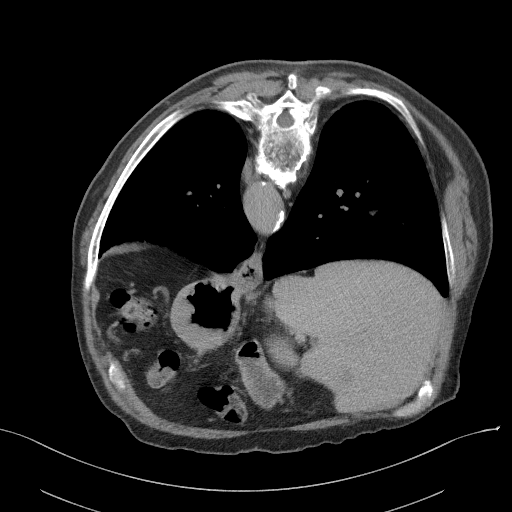
[im 85/107  lung]
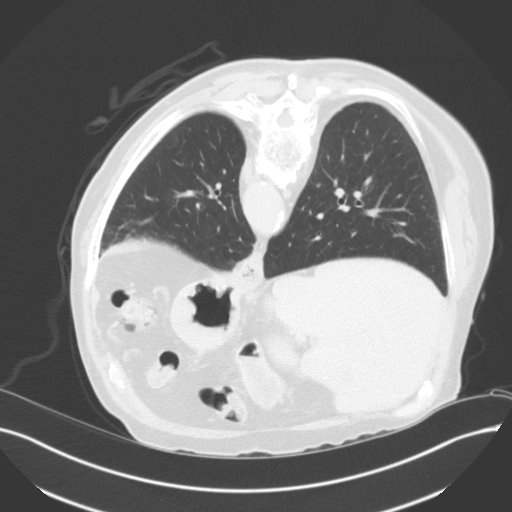

[Series 17: coronal delay · coronal · delayed · 1.06mm/px · 1 of 103 slices shown, 2 images]
[im 52/103  soft-tissue]
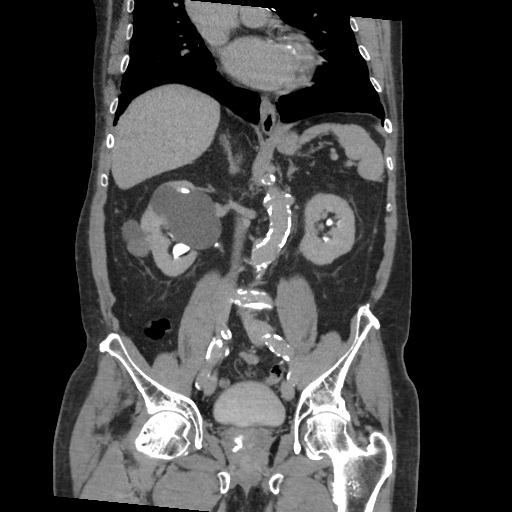
[im 52/103  bone]
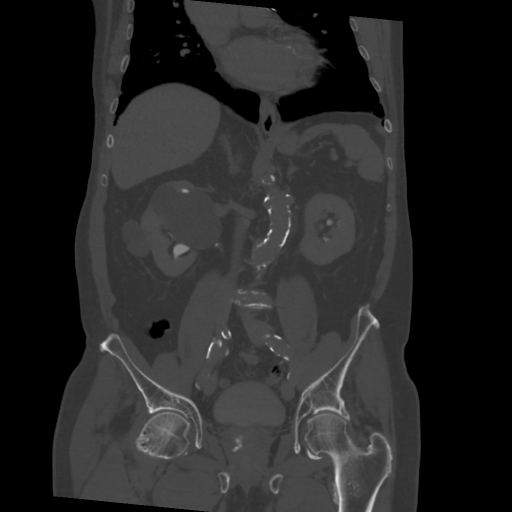

[11 of 46 positions shown; findings below may reference images not displayed]

FINDINGS: Lower chest: No acute pulmonary findings at the lung bases. No
pleural effusions or pulmonary lesions. The heart is normal in size.
Moderate aortic and coronary artery calcifications. No pericardial
effusion.

Hepatobiliary: No hepatic lesions or intrahepatic biliary
dilatation. The gallbladder is normal. No common bile duct
dilatation.

Pancreas: No mass aam a inflammation or ductal dilatation.

Spleen: Normal size.  No focal lesions.

Adrenals/Urinary Tract: Adrenal glands are normal.

There are bilateral renal calculi but no obstructing ureteral
calculi or bladder calculi. There is a large parapelvic cyst noted
on the right side and simple bilateral parenchymal cysts
bilaterally.

2.4 cm cyst projecting off the lower pole region of the right kidney
laterally is slightly high in attenuation measuring 30 Hounsfield
units. This does not demonstrate any contrast enhancement and is
consistent with a benign proteinaceous or hemorrhagic cyst. No
worrisome enhancing renal lesions are identified.

The delayed images do not demonstrate any significant collecting
system abnormalities. Both ureters are normal. The bladder is
normal. No asymmetric bladder wall thickening or bladder mass.

Stomach/Bowel: The stomach, duodenum, small bowel and colon are
unremarkable. No acute inflammatory changes, mass lesions or
obstructive findings. There the terminal ileum is normal. The
appendix is normal.

Vascular/Lymphatic: Marked tortuosity the and advanced
atherosclerotic calcifications involving the abdominal aorta. There
is a 3.3 x 3.3 cm infrarenal abdominal aortic aneurysm just above
the iliac artery bifurcation. No dissection. The branch vessels are
patent. Mild fusiform enlargement of both iliac arteries and
advanced atherosclerotic calcifications. Maximum diameter of the
right common iliac artery is 2.2 cm and the left is 2.0 cm.

The major venous structures are patent. No mesenteric or
retroperitoneal mass or adenopathy.

Reproductive: The prostate gland and seminal vesicles are
unremarkable.

Other: No pelvic mass or adenopathy. No free pelvic fluid
collections. No inguinal mass or adenopathy. No abdominal wall
hernia or subcutaneous lesions.

Musculoskeletal: No significant bony findings.
IMPRESSION: 1. Bilateral renal calculi but no obstructing ureteral calculi or
bladder calculi.
2. Bilateral renal cysts but no worrisome enhancing renal lesions.
3. No acute abdominal/pelvic findings, mass lesions or
lymphadenopathy.
4. 3.3 x 3.3 cm infrarenal abdominal aortic aneurysm.Recommend
follow-up ultrasound every 3 years. This recommendation follows ACR
consensus guidelines: White Paper of the ACR Incidental Findings
Committee II on Vascular Findings. [HOSPITAL] 1810;
[DATE].
5. Advanced atherosclerotic calcifications involving the aorta and
branch vessels.

Aortic Atherosclerosis ([id number]).

## 2022-05-19 ENCOUNTER — Ambulatory Visit: Payer: Medicare HMO | Attending: Urology | Admitting: Physical Therapy

## 2022-05-19 ENCOUNTER — Encounter: Payer: Self-pay | Admitting: Physical Therapy

## 2022-05-19 DIAGNOSIS — M6208 Separation of muscle (nontraumatic), other site: Secondary | ICD-10-CM | POA: Insufficient documentation

## 2022-05-19 DIAGNOSIS — N393 Stress incontinence (female) (male): Secondary | ICD-10-CM | POA: Insufficient documentation

## 2022-05-19 DIAGNOSIS — M79601 Pain in right arm: Secondary | ICD-10-CM | POA: Insufficient documentation

## 2022-05-19 DIAGNOSIS — R278 Other lack of coordination: Secondary | ICD-10-CM | POA: Insufficient documentation

## 2022-05-19 DIAGNOSIS — R2689 Other abnormalities of gait and mobility: Secondary | ICD-10-CM | POA: Diagnosis not present

## 2022-05-19 NOTE — Patient Instructions (Signed)
  Avoid straining pelvic floor, abdominal muscles , spine  Use log rolling technique instead of getting out of bed with your neck or the sit-up     Log rolling into and out of bed   Log rolling into and out of bed If getting out of bed on R side, Bent knees, scoot hips/ shoulder to L  Raise R arm completely overhead, rolling onto armpit  Then lower bent knees to bed to get into complete side lying position  Then drop legs off bed, and push up onto R elbow/forearm, and use L hand to push onto the bed    Dig elbows and feet to lift hte buttocks and scoot without lifting head    __   Proper body mechanics with getting out of a chair to decrease strain  on back &pelvic floor   Avoid holding your breath when Getting out of the chair:  Scoot to front part of chair chair Heels behind knees, feet are hip width apart, nose over toes  Inhale like you are smelling roses Exhale to stand   __  Sitting with feet on the floor  ___   PLEASE AVOID THE AB machine

## 2022-05-19 NOTE — Therapy (Addendum)
OUTPATIENT PHYSICAL THERAPY EVALUATION   Patient Name: James [name] MRN: [id number] DOB:[birth year], 86 y.o., male Today's Date: 05/19/2022   PT End of Session - 05/19/22 1423     Visit Number 1    Number of Visits 10    Date for PT Re-Evaluation 07/28/22    Start Time 14:15,  End 15:00         Past Medical History:  Diagnosis Date   Anxiety    Aortic atherosclerosis ([place])    Aortic dilatation ([place]) 01/10/2021   a.) CT 01/10/2021: infrarenal aortic dilitation measuring 3.3 x 3.3 cm. b.) CT 06/23/2021: measured 3.4 cm.   Arthritis    B12 deficiency    Basal cell carcinoma 03/14/2019   R nasal tip    Bilateral renal cysts    BPH (benign prostatic hyperplasia)    Bradycardia    Chronic kidney disease    COPD (chronic obstructive pulmonary disease) (HCC)    Coronary artery disease    a.) s/p 2v CABG in 2001; performed while living in [place].   Diastolic dysfunction 06/2584   a.) TTE 06/2020: EF 60%; sev LA dilation, mild RA dilation; triv PR, mild to mod TR/MR/AR; G1DD.   Frequency of urination    GERD (gastroesophageal reflux disease)    Hiatal hernia    History of kidney stones    HLD (hyperlipidemia)    Hypertension    Internal hemorrhoids    Long term current use of anticoagulant    a.) Rivaroxaban   Mobitz type 1 second degree AV block    OSA (obstructive sleep apnea)    a.) does not require nocturnal PAP therapy   Osteopenia    Pulmonary HTN ([place]) 06/2020   a.) mild   S/P CABG x 2 2001   a.) details unknown; performed in 2001 in [place].   Past Surgical History:  Procedure Laterality Date   CARDIAC CATHETERIZATION     COLONOSCOPY     CORONARY ARTERY BYPASS GRAFT     CYSTOSCOPY/URETEROSCOPY/HOLMIUM LASER/STENT PLACEMENT Right 11/07/2021   Procedure: CYSTOSCOPY/URETEROSCOPY/HOLMIUM LASER/STENT PLACEMENT;  Surgeon: Billey Co, MD;  Location: [facility] ORS;  Service: Urology;  Laterality: Right;   FRACTURE SURGERY Left    fractured  left arm   HOLEP-LASER ENUCLEATION OF THE PROSTATE WITH MORCELLATION N/A 11/07/2021   Procedure: HOLEP-LASER ENUCLEATION OF THE PROSTATE WITH MORCELLATION;  Surgeon: Billey Co, MD;  Location: [facility];  Service: Urology;  Laterality: N/A;   LIPOMA EXCISION N/A 11/21/2018   Procedure: EXCISION BACK LIPOMA;  Surgeon: Jovita Kussmaul, MD;  Location: [place];  Service: General;  Laterality: N/A;   PILONIDAL CYST EXCISION     SKIN CANCER EXCISION     TOTAL SHOULDER REPLACEMENT Left    UMBILICAL HERNIA REPAIR N/A 11/21/2018   Procedure: HERNIA REPAIR UMBILICAL ADULT WITH MESH;  Surgeon: Jovita Kussmaul, MD;  Location: [facility];  Service: General;  Laterality: N/A;   Patient Active Problem List   Diagnosis Date Noted   BPH with obstruction/lower urinary tract symptoms 03/21/2020   Nephrolithiasis 03/21/2020   Bradycardia, sinus 03/16/2019   Dizziness 03/16/2019   Essential hypertension 03/16/2019   Renal cyst 06/24/2018   Hiatal hernia 06/13/2018   Coronary atherosclerosis of autologous vein bypass graft 06/22/2017   Vitamin B12 deficiency 12/30/2016   Vitamin D deficiency 12/28/2016   Family history of malignant neoplasm of other organs or systems 12/08/2016   Obstructive sleep apnea (adult) (pediatric) 01/01/2016   Overweight 12/16/2015  Diverticulosis of colon 11/01/2015   Internal hemorrhoid 11/01/2015   Senile ectropion of left lower eyelid 06/28/2014   Cherry angioma 02/22/2014   Lentigo 02/22/2014   Actinic keratosis 02/22/2014   Not current smoker 05/23/2013   Personal history of other malignant neoplasm of skin 05/23/2013   Hardening of the aorta (main artery of the heart) ([place]) 07/17/2012   Anxiety 06/15/2012   COPD (chronic obstructive pulmonary disease) ([place]) 06/15/2012   Neck mass 10/17/2011   Anemia 08/06/2011   Closed fracture of part of upper end of humerus 08/03/2011   Hx of CABG 01/22/2011   Hyperlipidemia 01/21/2011   Osteopenia 05/01/2010   Dyspepsia 03/08/2010    Colon polyp 05/09/2009   Tobacco abuse counseling 11/11/2007    PCP: Humphrey Rolls   REFERRING PROVIDER: [place]   REFERRING DIAG:  Stress Incontinence   Rationale for Evaluation and Treatment Rehabilitation  THERAPY DIAG:  Other lack of coordination  Other abnormalities of gait and mobility  ONSET DATE: 1.5 years   SUBJECTIVE:                                                                                                                                                                                           SUBJECTIVE STATEMENT: 1)  SUI: Pt noticed leakage when moving , walking, sit to stand and getting to the toilet. It worsened after the HOLEP in 10/2021.  Changes 3-5 diapers per day, 3-5 x per night. Pt's diapers need to be changed every 3 hours. Pt is able to feel the urge but leaking before getting the bathroom.                2)  Nocturia prior to HOLEP 1-2 x  , after surgery 1-5 x .              Pt has not had a sleep study. The last couple of nights, he has lacked sleep.  Ten-fifteen years ago, pt had a CPAP but he gave up on using it.                3) Frequency: Pt reports he " feels consistently inconsistent with everything". Pt exercised and ate without peeing for 3 hours across two incidents. Pt peed slight volume 5 x across 2 hours  today. Daily fluid intake: 16 fl oz coffee, herbal before bed, no soda, water 66 fl oz.                            4) balance : pt has been feeling unsteady and it worsened across the past 6  months. Pt is using a cane when out in the community.                  5) RUE AROM limitation and pain: R shoulder broken when he was on a step climber at the gym. Pt did not have surgery for his injury. It still hurts with reaching overhead.              Physical activity: pt just started going to the gym daily for the past 3 days. Aerobics: recumbent biking: 10 min rest 10 min, Abdominal : 8 reps for 100lb abdominal weight machine,  arms: lats pulls.     Denied LBP   PERTINENT HISTORY:    But in 2001, Pt had open heart surgery and one by-pass. Pt tripped over a branch and broken L arm with a plate. Pt did not take advantage of rehab.   Hx of discontinuation of CPAP machine, hypertension, COPD, Hx CABG, osteopenia, diverticulitis of colon, and other comorbidities ( see medical Hx section)   PAIN:  Are you having pain? No   PRECAUTIONS: None  WEIGHT BEARING RESTRICTIONS No  FALLS:  Has patient fallen in last 6 months? No.  LIVING ENVIRONMENT: Lives with: lives alone Lives in: House/apartment Stairs: Yes, indoors  Has following equipment at home: Single point cane for balance ( pt left it in the car)   OCCUPATION:  accountant   PLOF: Independent  PATIENT GOALS                 Stop peeing frequently  OBJECTIVE:   [facility] PT Assessment - 05/20/22 0949   Posture: sitting slumped posture,   Plan to assess B UE AROM at next sessions     Palpation   SI assessment  R iliac crest lower, R shoulder higher,             Pelvic Floor Special Questions - 05/19/22 1455     Diastasis Recti 3 fingers below sternum             [facility] Adult PT Treatment/Exercise - 05/20/22 0949       Bed Mobility   Bed Mobility --   crunch     Ambulation/Gait   Gait velocity 0.9 m/s    Gait Comments R hip drop, L hip higher, decreased push off on R,      Therapeutic Activites    Therapeutic Activities Other Therapeutic Activities    Other Therapeutic Activities see pt instruction and education section      Neuro Re-ed    Neuro Re-ed Details  cued for body mechanics to minimize straining back and pelvic floor               HOME EXERCISE PROGRAM: See pt instruction section    ASSESSMENT:  CLINICAL IMPRESSION:               Pt is a  86 yo who presents with urinary frequency, SUI, nocturia, RUE AROM limitation and pain, and balance deficits which impact QOL, ADL, fitness, and community activities.   Pt's musculoskeletal  assessment revealed uneven pelvic girdle and shoulder height, asymmetries to gait pattern, limited spinal /pelvic mobility, diastasis recti, dyscoordination and strength of pelvic floor mm, poor body mechanics which places strain on the abdominal/pelvic floor mm. These are deficits that indicate an ineffective intraabdominal pressure system associated with increased risk for pt's Sx.   Pt will benefit from proper coordination training and education on fitness and functional positions  in order to yield greater outcomes as pt performed sit-ups/ crunches in the past.  Advised pt to not perform sit-ups and crunches as these movement patterns lead to more downward forces on the pelvic floor, negatively impacting abdominopelvic/spinal dysfunctions.   Pt was provided education on etiology of Sx with anatomy, physiology explanation with images along with the benefits of customized pelvic PT Tx based on pt's medical conditions and musculoskeletal deficits.  Explained the physiology of deep core mm coordination and roles of pelvic floor function in urination, defecation, sexual function, and postural control with deep core mm system.   Regional interdependent approaches will yield greater benefits in pt's POC due to the complexity of pt's medical Hx, orthopedic limitations with UE, and the significant impact their Sx have had on their QOL.   Following Tx today which pt tolerated without complaints, pt demo'd proper body mechanics to minimize straining pelvic / spine. Plan to address asymmetrical alignment of spine and pelvis and DRA.   Pt benefit from skilled PT.    OBJECTIVE IMPAIRMENTS decreased activity tolerance, decreased coordination, decreased endurance, decreased mobility, difficulty walking, decreased ROM, decreased strength, decreased safety awareness, hypomobility, increased muscle spasms, impaired flexibility, improper body mechanics, postural dysfunction, and pain, scar restrictions   ACTIVITY  LIMITATIONS  self-care, community activities , sleep    PARTICIPATION LIMITATIONS:  gym activities    PERSONAL FACTORS   affecting patient's functional outcome: co morbidities : 2001, Pt had open heart surgery and one by-pass.  Hx of discontinuation of CPAP machine, hypertension, COPD, Hx CABG, osteopenia, diverticulitis of colon, and other comorbidities ( see medical Hx section) . Also shoulder injury, broken L arm with a plate. Pt did not take advantage of rehab. Pt used to workout in the gym.    REHAB POTENTIAL: Good   CLINICAL DECISION MAKING: Evolving/moderate complexity   EVALUATION COMPLEXITY: Moderate    PATIENT EDUCATION:    Education details: Showed pt anatomy images. Explained muscles attachments/ connection, physiology of deep core system/ spinal- thoracic-pelvis-lower kinetic chain as they relate to pt's presentation, Sx, and past Hx. Explained what and how these areas of deficits need to be restored to balance and function    See Therapeutic activity / neuromuscular re-education section  Answered pt's questions.   Person educated: Patient Education method: Explanation, Demonstration, Tactile cues, Verbal cues, and Handouts Education comprehension: verbalized understanding, returned demonstration, verbal cues required, tactile cues required, and needs further education     PLAN: PT FREQUENCY: 1x/week   PT DURATION: 10 weeks   PLANNED INTERVENTIONS: Therapeutic exercises, Therapeutic activity, Neuromuscular re-education, Balance training, Gait training, Patient/Family education, Self Care, Joint mobilization, Spinal mobilization, Moist heat, Taping, and Manual therapy.   PLAN FOR NEXT SESSION: See clinical impression for plan     GOALS: Goals reviewed with patient? Yes  SHORT TERM GOALS: Target date: 06/16/2022    Pt will demo IND with HEP                    Baseline: Not IND            Goal status: INITIAL   LONG TERM GOALS: Target date:  07/28/2022    1.Pt will demo proper deep core coordination without chest breathing and optimal excursion of diaphragm/pelvic floor in order to promote spinal stability and pelvic floor function  Baseline: dyscoordination Goal status: INITIAL  2.  Pt will demo > 5 pt change on FOTO  to improve QOL and function  Urinary Problem baseline  41 pts PFDI Urinary 29 pts   Goal status: INITIAL  3.  Pt will demo proper body mechanics in against gravity tasks and ADLs  work tasks, fitness  to minimize straining pelvic floor / back                  Baseline: not IND, improper form that places strain on pelvic floor                Goal status: INITIAL    4. Pt will demo decreased abdominal separation from 3 fingers width to < 2 fingers width along linea alba in order to improve IAP system   Baseline: 3 fingers width Goal status: INITIAL    5. Pt will demo increased gait speed > 1.3 m/s in order to ambulate safely in community and return to fitness routine  Baseline: 0.9 m/s ( R hip drop, L hip higher, decreased push off on R)  Goal status: INITIAL   6. Pt will demo proper alignment and technique with customized gym workout that adjusts for shoulder injuries / surgery and minimizing urinary dysfunctions.   Baseline:  lack of education for proper workout and currently performing ab exercises that places downward force onto pelvic floor   Goal status: INITIAL     Jerl Mina, PT 05/19/2022, 2:25 PM

## 2022-05-29 DIAGNOSIS — R69 Illness, unspecified: Secondary | ICD-10-CM | POA: Diagnosis not present

## 2022-05-29 DIAGNOSIS — I2510 Atherosclerotic heart disease of native coronary artery without angina pectoris: Secondary | ICD-10-CM | POA: Diagnosis not present

## 2022-05-29 DIAGNOSIS — E782 Mixed hyperlipidemia: Secondary | ICD-10-CM | POA: Diagnosis not present

## 2022-05-29 DIAGNOSIS — I10 Essential (primary) hypertension: Secondary | ICD-10-CM | POA: Diagnosis not present

## 2022-06-01 ENCOUNTER — Ambulatory Visit: Payer: Medicare HMO | Admitting: Physical Therapy

## 2022-06-08 ENCOUNTER — Ambulatory Visit: Payer: Medicare HMO | Attending: Urology | Admitting: Physical Therapy

## 2022-06-08 DIAGNOSIS — M6208 Separation of muscle (nontraumatic), other site: Secondary | ICD-10-CM | POA: Diagnosis not present

## 2022-06-08 DIAGNOSIS — M79601 Pain in right arm: Secondary | ICD-10-CM | POA: Diagnosis not present

## 2022-06-08 DIAGNOSIS — R2689 Other abnormalities of gait and mobility: Secondary | ICD-10-CM | POA: Diagnosis not present

## 2022-06-08 DIAGNOSIS — R278 Other lack of coordination: Secondary | ICD-10-CM | POA: Insufficient documentation

## 2022-06-08 NOTE — Therapy (Signed)
OUTPATIENT PHYSICAL THERAPY Treatment   Patient Name: James [name] MRN: [id number] DOB:1936/02/04, 86 y.o., male Today's Date: 06/08/2022   PT End of Session - 06/08/22 1115     Visit Number 2    Number of Visits 10    Date for PT Re-Evaluation 07/28/22    PT Start Time 1110    PT Stop Time 1150    PT Time Calculation (min) 40 min    Activity Tolerance Patient tolerated treatment well    Behavior During Therapy [facility] for tasks assessed/performed              Past Medical History:  Diagnosis Date   Anxiety    Aortic atherosclerosis ([place])    Aortic dilatation ([place]) 01/10/2021   a.) CT 01/10/2021: infrarenal aortic dilitation measuring 3.3 x 3.3 cm. b.) CT 06/23/2021: measured 3.4 cm.   Arthritis    B12 deficiency    Basal cell carcinoma 03/14/2019   R nasal tip    Bilateral renal cysts    BPH (benign prostatic hyperplasia)    Bradycardia    Chronic kidney disease    COPD (chronic obstructive pulmonary disease) (HCC)    Coronary artery disease    a.) s/p 2v CABG in 2001; performed while living in [place].   Diastolic dysfunction 63/1497   a.) TTE 06/2020: EF 60%; sev LA dilation, mild RA dilation; triv PR, mild to mod TR/MR/AR; G1DD.   Frequency of urination    GERD (gastroesophageal reflux disease)    Hiatal hernia    History of kidney stones    HLD (hyperlipidemia)    Hypertension    Internal hemorrhoids    Long term current use of anticoagulant    a.) Rivaroxaban   Mobitz type 1 second degree AV block    OSA (obstructive sleep apnea)    a.) does not require nocturnal PAP therapy   Osteopenia    Pulmonary HTN ([place]) 06/2020   a.) mild   S/P CABG x 2 2001   a.) details unknown; performed in 2001 in [place].   Past Surgical History:  Procedure Laterality Date   CARDIAC CATHETERIZATION     COLONOSCOPY     CORONARY ARTERY BYPASS GRAFT     CYSTOSCOPY/URETEROSCOPY/HOLMIUM LASER/STENT PLACEMENT Right 11/07/2021   Procedure:  CYSTOSCOPY/URETEROSCOPY/HOLMIUM LASER/STENT PLACEMENT;  Surgeon: Billey Co, MD;  Location: [facility] ORS;  Service: Urology;  Laterality: Right;   FRACTURE SURGERY Left    fractured left arm   HOLEP-LASER ENUCLEATION OF THE PROSTATE WITH MORCELLATION N/A 11/07/2021   Procedure: HOLEP-LASER ENUCLEATION OF THE PROSTATE WITH MORCELLATION;  Surgeon: Billey Co, MD;  Location: [facility] ORS;  Service: Urology;  Laterality: N/A;   LIPOMA EXCISION N/A 11/21/2018   Procedure: EXCISION BACK LIPOMA;  Surgeon: Jovita Kussmaul, MD;  Location: [place];  Service: General;  Laterality: N/A;   PILONIDAL CYST EXCISION     SKIN CANCER EXCISION     TOTAL SHOULDER REPLACEMENT Left    UMBILICAL HERNIA REPAIR N/A 11/21/2018   Procedure: HERNIA REPAIR UMBILICAL ADULT WITH MESH;  Surgeon: Jovita Kussmaul, MD;  Location: [facility] OR;  Service: General;  Laterality: N/A;   Patient Active Problem List   Diagnosis Date Noted   BPH with obstruction/lower urinary tract symptoms 03/21/2020   Nephrolithiasis 03/21/2020   Bradycardia, sinus 03/16/2019   Dizziness 03/16/2019   Essential hypertension 03/16/2019   Renal cyst 06/24/2018   Hiatal hernia 06/13/2018   Coronary atherosclerosis of autologous vein bypass graft 06/22/2017  Vitamin B12 deficiency 12/30/2016   Vitamin D deficiency 12/28/2016   Family history of malignant neoplasm of other organs or systems 12/08/2016   Obstructive sleep apnea (adult) (pediatric) 01/01/2016   Overweight 12/16/2015   Diverticulosis of colon 11/01/2015   Internal hemorrhoid 11/01/2015   Senile ectropion of left lower eyelid 06/28/2014   Cherry angioma 02/22/2014   Lentigo 02/22/2014   Actinic keratosis 02/22/2014   Not current smoker 05/23/2013   Personal history of other malignant neoplasm of skin 05/23/2013   Hardening of the aorta (main artery of the heart) ([place]) 07/17/2012   Anxiety 06/15/2012   COPD (chronic obstructive pulmonary disease) ([place]) 06/15/2012   Neck mass 10/17/2011    Anemia 08/06/2011   Closed fracture of part of upper end of humerus 08/03/2011   Hx of CABG 01/22/2011   Hyperlipidemia 01/21/2011   Osteopenia 05/01/2010   Dyspepsia 03/08/2010   Colon polyp 05/09/2009   Tobacco abuse counseling 11/11/2007    PCP: Humphrey Rolls   REFERRING PROVIDER: [place]   REFERRING DIAG:  Stress Incontinence   Rationale for Evaluation and Treatment Rehabilitation  THERAPY DIAG:  Other lack of coordination  Other abnormalities of gait and mobility  Pain in right arm  Diastasis recti  ONSET DATE: 1.5 years   SUBJECTIVE:                                                                                                                                                                                           SUBJECTIVE STATEMENT: 1)  SUI: Pt feels encouraged after last session . Leakage occurs with standing up.                2)  Nocturia : every 3 hours . Pt reports his sleep has been better. Pt has cut off eating and drinking at 7pm.                3) Frequency: Pt makes it a point to drink his 66 fl oz of water.                           4) balance : pt has been feeling unsteady and it worsened across the past 6 months. Pt is using a cane when out in the community.  Today, pt arrives without a cane. Pt has not been using it the past few days. Pt has been getting better sleep and he thinks it has been helping his legs.                 5) RUE AROM limitation and pain: ( will address  at later sessions)              Physical activity: pt just started going to the gym daily for the past 3 days. Aerobics: recumbent biking: 10 min rest 10 min, Abdominal : 8 reps for 100lb abdominal weight machine,  arms: lats pulls.    Denied LBP   PERTINENT HISTORY:    But in 2001, Pt had open heart surgery and one by-pass. Pt tripped over a branch and broken L arm with a plate. Pt did not take advantage of rehab.   Hx of discontinuation of CPAP machine, hypertension, COPD, Hx  CABG, osteopenia, diverticulitis of colon, and other comorbidities ( see medical Hx section)   PAIN:  Are you having pain? No   PRECAUTIONS: None  WEIGHT BEARING RESTRICTIONS No  FALLS:  Has patient fallen in last 6 months? No.  LIVING ENVIRONMENT: Lives with: lives alone Lives in: House/apartment Stairs: Yes, indoors  Has following equipment at home: Single point cane for balance ( pt left it in the car)   OCCUPATION:  accountant   PLOF: Independent  PATIENT GOALS                 Stop peeing frequently   OBJECTIVE:   [facility] PT Assessment - 06/08/22 1122       AROM   Overall AROM Comments R shoulder flex  in L sideyling, 120 deg ( post Tx: 135 deg)      Strength   Overall Strength Comments B hip abd 3/5, R hip more limited in hip extension      Palpation   Spinal mobility significantly limited lateral and anterior excursion of diaphragm, intercostals on throaic on R and paraspinal. interspinal tightness along T5-T12 on R    SI assessment  R iliac crest lower, L thorax rotated posterior,             [facility] Adult PT Treatment/Exercise - 06/08/22 1146       Neuro Re-ed    Neuro Re-ed Details  cued for HEP and modification to accommodate for limited shoulder flex on R UE      Modalities   Modalities Moist Heat      Moist Heat Therapy   Number Minutes Moist Heat 5 Minutes    Moist Heat Location --   unbilled, on R thoracic to promote R posterior rotation     Manual Therapy   Manual therapy comments STM/MWM at problem areas noted in assessment to promote excursion of R diaphragm and address scoliosis              HOME EXERCISE PROGRAM: See pt instruction section    ASSESSMENT:  CLINICAL IMPRESSION:               Addressed limited anterior/ lateral excursion of diaphragm on R with manual Tx which pt tolerated without pain. Pt demo'd improved diaphragmatic excursion post Tx but will still need more manual Tx to address the L posterior rotated thorax /  limited diaphragmatic excursion.  Plan to address this next session and this will help pt yield better outcomes for deep core training. Pt demo'd increased R shoulder flexion post Tx and anticipate as pt's posture and thoracic mobility improves, pt's RUE AROM will also improve.   Pt benefit from skilled PT.    OBJECTIVE IMPAIRMENTS decreased activity tolerance, decreased coordination, decreased endurance, decreased mobility, difficulty walking, decreased ROM, decreased strength, decreased safety awareness, hypomobility, increased muscle spasms, impaired flexibility, improper body mechanics, postural  dysfunction, and pain, scar restrictions   ACTIVITY LIMITATIONS  self-care, community activities , sleep    PARTICIPATION LIMITATIONS:  gym activities    PERSONAL FACTORS   affecting patient's functional outcome: co morbidities : 2001, Pt had open heart surgery and one by-pass.  Hx of discontinuation of CPAP machine, hypertension, COPD, Hx CABG, osteopenia, diverticulitis of colon, and other comorbidities ( see medical Hx section) . Also shoulder injury, broken L arm with a plate. Pt did not take advantage of rehab. Pt used to workout in the gym.    REHAB POTENTIAL: Good   CLINICAL DECISION MAKING: Evolving/moderate complexity   EVALUATION COMPLEXITY: Moderate    PATIENT EDUCATION:    Education details: Showed pt anatomy images. Explained muscles attachments/ connection, physiology of deep core system/ spinal- thoracic-pelvis-lower kinetic chain as they relate to pt's presentation, Sx, and past Hx. Explained what and how these areas of deficits need to be restored to balance and function    See Therapeutic activity / neuromuscular re-education section  Answered pt's questions.   Person educated: Patient Education method: Explanation, Demonstration, Tactile cues, Verbal cues, and Handouts Education comprehension: verbalized understanding, returned demonstration, verbal cues required, tactile  cues required, and needs further education     PLAN: PT FREQUENCY: 1x/week   PT DURATION: 10 weeks   PLANNED INTERVENTIONS: Therapeutic exercises, Therapeutic activity, Neuromuscular re-education, Balance training, Gait training, Patient/Family education, Self Care, Joint mobilization, Spinal mobilization, Moist heat, Taping, and Manual therapy.   PLAN FOR NEXT SESSION: See clinical impression for plan     GOALS: Goals reviewed with patient? Yes  SHORT TERM GOALS: Target date: 06/16/2022    Pt will demo IND with HEP                    Baseline: Not IND            Goal status: INITIAL   LONG TERM GOALS: Target date: 07/28/2022    1.Pt will demo proper deep core coordination without chest breathing and optimal excursion of diaphragm/pelvic floor in order to promote spinal stability and pelvic floor function  Baseline: dyscoordination Goal status: INITIAL  2.  Pt will demo > 5 pt change on FOTO  to improve QOL and function  Urinary Problem baseline  41 pts PFDI Urinary 29 pts   Goal status: INITIAL  3.  Pt will demo proper body mechanics in against gravity tasks and ADLs  work tasks, fitness  to minimize straining pelvic floor / back                  Baseline: not IND, improper form that places strain on pelvic floor                Goal status: INITIAL    4. Pt will demo decreased abdominal separation from 3 fingers width to < 2 fingers width along linea alba in order to improve IAP system   Baseline: 3 fingers width Goal status: INITIAL    5. Pt will demo increased gait speed > 1.3 m/s in order to ambulate safely in community and return to fitness routine  Baseline: 0.9 m/s ( R hip drop, L hip higher, decreased push off on R)  Goal status: INITIAL   6. Pt will demo proper alignment and technique with customized gym workout that adjusts for shoulder injuries / surgery and minimizing urinary dysfunctions.   Baseline:  lack of education for proper workout and  currently performing ab exercises that places  downward force onto pelvic floor   Goal status: INITIAL     Jerl Mina, PT 06/08/2022, 11:15 AM

## 2022-06-08 NOTE — Patient Instructions (Addendum)
  3 x day ,   Brushing arm with 3/4 turn onto pillow behind back  Lying on L  side ,Pillow/ Block between knees     dragging top forearm across ribs below breast rotating 3/4 turn,  rotating  _R_ only this week , relax onto the pillow behind the back  and then back to other palm , maintain top palm on body whole top and not lift shoulder  ___  Walking with arm swings

## 2022-06-15 ENCOUNTER — Ambulatory Visit: Payer: Medicare HMO | Admitting: Physical Therapy

## 2022-06-15 DIAGNOSIS — M79601 Pain in right arm: Secondary | ICD-10-CM | POA: Diagnosis not present

## 2022-06-15 DIAGNOSIS — R278 Other lack of coordination: Secondary | ICD-10-CM | POA: Diagnosis not present

## 2022-06-15 DIAGNOSIS — M6208 Separation of muscle (nontraumatic), other site: Secondary | ICD-10-CM

## 2022-06-15 DIAGNOSIS — R2689 Other abnormalities of gait and mobility: Secondary | ICD-10-CM

## 2022-06-15 NOTE — Therapy (Signed)
OUTPATIENT PHYSICAL THERAPY Treatment   Patient Name: James [name] MRN: [id number] DOB:[birth year], 86 y.o., male Today's Date: 06/15/2022   PT End of Session - 06/15/22 1105     Visit Number 3    Number of Visits 10    Date for PT Re-Evaluation 07/28/22    PT Start Time 1105    PT Stop Time 1145    PT Time Calculation (min) 40 min    Activity Tolerance Patient tolerated treatment well    Behavior During Therapy [facility] for tasks assessed/performed              Past Medical History:  Diagnosis Date   Anxiety    Aortic atherosclerosis ([place])    Aortic dilatation ([place]) 01/10/2021   a.) CT 01/10/2021: infrarenal aortic dilitation measuring 3.3 x 3.3 cm. b.) CT 06/23/2021: measured 3.4 cm.   Arthritis    B12 deficiency    Basal cell carcinoma 03/14/2019   R nasal tip    Bilateral renal cysts    BPH (benign prostatic hyperplasia)    Bradycardia    Chronic kidney disease    COPD (chronic obstructive pulmonary disease) (HCC)    Coronary artery disease    a.) s/p 2v CABG in 2001; performed while living in [place].   Diastolic dysfunction 50/2774   a.) TTE 06/2020: EF 60%; sev LA dilation, mild RA dilation; triv PR, mild to mod TR/MR/AR; G1DD.   Frequency of urination    GERD (gastroesophageal reflux disease)    Hiatal hernia    History of kidney stones    HLD (hyperlipidemia)    Hypertension    Internal hemorrhoids    Long term current use of anticoagulant    a.) Rivaroxaban   Mobitz type 1 second degree AV block    OSA (obstructive sleep apnea)    a.) does not require nocturnal PAP therapy   Osteopenia    Pulmonary HTN ([place]) 06/2020   a.) mild   S/P CABG x 2 2001   a.) details unknown; performed in 2001 in [place].   Past Surgical History:  Procedure Laterality Date   CARDIAC CATHETERIZATION     COLONOSCOPY     CORONARY ARTERY BYPASS GRAFT     CYSTOSCOPY/URETEROSCOPY/HOLMIUM LASER/STENT PLACEMENT Right 11/07/2021   Procedure:  CYSTOSCOPY/URETEROSCOPY/HOLMIUM LASER/STENT PLACEMENT;  Surgeon: Billey Co, MD;  Location: [facility] ORS;  Service: Urology;  Laterality: Right;   FRACTURE SURGERY Left    fractured left arm   HOLEP-LASER ENUCLEATION OF THE PROSTATE WITH MORCELLATION N/A 11/07/2021   Procedure: HOLEP-LASER ENUCLEATION OF THE PROSTATE WITH MORCELLATION;  Surgeon: Billey Co, MD;  Location: [facility] ORS;  Service: Urology;  Laterality: N/A;   LIPOMA EXCISION N/A 11/21/2018   Procedure: EXCISION BACK LIPOMA;  Surgeon: Jovita Kussmaul, MD;  Location: [place];  Service: General;  Laterality: N/A;   PILONIDAL CYST EXCISION     SKIN CANCER EXCISION     TOTAL SHOULDER REPLACEMENT Left    UMBILICAL HERNIA REPAIR N/A 11/21/2018   Procedure: HERNIA REPAIR UMBILICAL ADULT WITH MESH;  Surgeon: Jovita Kussmaul, MD;  Location: [facility] OR;  Service: General;  Laterality: N/A;   Patient Active Problem List   Diagnosis Date Noted   BPH with obstruction/lower urinary tract symptoms 03/21/2020   Nephrolithiasis 03/21/2020   Bradycardia, sinus 03/16/2019   Dizziness 03/16/2019   Essential hypertension 03/16/2019   Renal cyst 06/24/2018   Hiatal hernia 06/13/2018   Coronary atherosclerosis of autologous vein bypass graft 06/22/2017  Vitamin B12 deficiency 12/30/2016   Vitamin D deficiency 12/28/2016   Family history of malignant neoplasm of other organs or systems 12/08/2016   Obstructive sleep apnea (adult) (pediatric) 01/01/2016   Overweight 12/16/2015   Diverticulosis of colon 11/01/2015   Internal hemorrhoid 11/01/2015   Senile ectropion of left lower eyelid 06/28/2014   Cherry angioma 02/22/2014   Lentigo 02/22/2014   Actinic keratosis 02/22/2014   Not current smoker 05/23/2013   Personal history of other malignant neoplasm of skin 05/23/2013   Hardening of the aorta (main artery of the heart) ([place]) 07/17/2012   Anxiety 06/15/2012   COPD (chronic obstructive pulmonary disease) ([place]) 06/15/2012   Neck mass 10/17/2011    Anemia 08/06/2011   Closed fracture of part of upper end of humerus 08/03/2011   Hx of CABG 01/22/2011   Hyperlipidemia 01/21/2011   Osteopenia 05/01/2010   Dyspepsia 03/08/2010   Colon polyp 05/09/2009   Tobacco abuse counseling 11/11/2007    PCP: Humphrey Rolls   REFERRING PROVIDER: [place]   REFERRING DIAG:  Stress Incontinence   Rationale for Evaluation and Treatment Rehabilitation  THERAPY DIAG:  Other lack of coordination  Other abnormalities of gait and mobility  Pain in right arm  Diastasis recti  ONSET DATE: 1.5 years   SUBJECTIVE:                                                                                                                                                                                           SUBJECTIVE STATEMENT: 1)  SUI: Pt reports leakage is better when getting up from sit to stand                2)  Nocturia : every 3 hours .                3) Frequency: Pt noticed he is getting the sensation of urinating.  Pt notices when he is out and about, pt is not having as much problem.  Pt notices when he sits to pee, nothings comes out. Pt is not leakage before getting to the toilet.               4) balance : Pt feels more steady on his feet. Better balance                 5) RUE AROM limitation and pain: ( will address at later sessions)              Physical activity: pt just started going to the gym daily for the past 3 days. Aerobics: recumbent biking: 10 min rest 10 min, Abdominal : 8  reps for 100lb abdominal weight machine,  arms: lats pulls.    Denied LBP   PERTINENT HISTORY:    But in 2001, Pt had open heart surgery and one by-pass. Pt tripped over a branch and broken L arm with a plate. Pt did not take advantage of rehab.   Hx of discontinuation of CPAP machine, hypertension, COPD, Hx CABG, osteopenia, diverticulitis of colon, and other comorbidities ( see medical Hx section)   PAIN:  Are you having pain? No   PRECAUTIONS:  None  WEIGHT BEARING RESTRICTIONS No  FALLS:  Has patient fallen in last 6 months? No.  LIVING ENVIRONMENT: Lives with: lives alone Lives in: House/apartment Stairs: Yes, indoors  Has following equipment at home: Single point cane for balance ( pt left it in the car)   OCCUPATION:  accountant   PLOF: Independent  PATIENT GOALS                 Stop peeing frequently   OBJECTIVE:      [facility] PT Assessment - 06/15/22 1154       Bed Mobility   Bed Mobility --   poor carry over with log rolling            [facility] Adult PT Treatment/Exercise - 06/15/22 1154       Ambulation/Gait   Gait velocity 1.06 m/s    Gait Comments levelled pelvic girdle      Neuro Re-ed    Neuro Re-ed Details  cued for proper log rolling technique and feet hip with apart      Modalities   Modalities Moist Heat      Moist Heat Therapy   Number Minutes Moist Heat 5 Minutes    Moist Heat Location --   thoracic , legs propped to lengthen extensor mm     Manual Therapy   Manual therapy comments STM/MWM at problem areas noted in assessment to promote excursion of R diaphragm and address scoliosis R elevated shoulder                HOME EXERCISE PROGRAM: See pt instruction section    ASSESSMENT:  CLINICAL IMPRESSION:               Pt's pelvic girdle has been levelled and pt is partially compliant. Pt reported improvements with urinary and  balance issues.   Continued to addressed limited anterior/ lateral excursion of diaphragm on R with manual Tx which pt tolerated without pain. Pt demo'd improved diaphragmatic excursion post Tx but will still need more manual Tx to address scoliotic spine and limited diaphragmatic excursion.  Plan to address this next session and progress to deep core next session. Add scapular retraction strengthening at next session. Once pt's thoracic kyphosis improves, plan to assess pt;s pelvic area and minimize posterior tilt of pelvis which will help with pt's  c/o of remaining issue related to difficulty with initiating urination.  Pt benefit from skilled PT.    OBJECTIVE IMPAIRMENTS decreased activity tolerance, decreased coordination, decreased endurance, decreased mobility, difficulty walking, decreased ROM, decreased strength, decreased safety awareness, hypomobility, increased muscle spasms, impaired flexibility, improper body mechanics, postural dysfunction, and pain, scar restrictions   ACTIVITY LIMITATIONS  self-care, community activities , sleep    PARTICIPATION LIMITATIONS:  gym activities    PERSONAL FACTORS   affecting patient's functional outcome: co morbidities : 2001, Pt had open heart surgery and one by-pass.  Hx of discontinuation of CPAP machine, hypertension, COPD, Hx CABG, osteopenia, diverticulitis of  colon, and other comorbidities ( see medical Hx section) . Also shoulder injury, broken L arm with a plate. Pt did not take advantage of rehab. Pt used to workout in the gym.    REHAB POTENTIAL: Good   CLINICAL DECISION MAKING: Evolving/moderate complexity   EVALUATION COMPLEXITY: Moderate    PATIENT EDUCATION:    Education details: Showed pt anatomy images. Explained muscles attachments/ connection, physiology of deep core system/ spinal- thoracic-pelvis-lower kinetic chain as they relate to pt's presentation, Sx, and past Hx. Explained what and how these areas of deficits need to be restored to balance and function    See Therapeutic activity / neuromuscular re-education section  Answered pt's questions.   Person educated: Patient Education method: Explanation, Demonstration, Tactile cues, Verbal cues, and Handouts Education comprehension: verbalized understanding, returned demonstration, verbal cues required, tactile cues required, and needs further education     PLAN: PT FREQUENCY: 1x/week   PT DURATION: 10 weeks   PLANNED INTERVENTIONS: Therapeutic exercises, Therapeutic activity, Neuromuscular re-education,  Balance training, Gait training, Patient/Family education, Self Care, Joint mobilization, Spinal mobilization, Moist heat, Taping, and Manual therapy.   PLAN FOR NEXT SESSION: See clinical impression for plan     GOALS: Goals reviewed with patient? Yes  SHORT TERM GOALS: Target date: 06/16/2022    Pt will demo IND with HEP                    Baseline: Not IND            Goal status: INITIAL   LONG TERM GOALS: Target date: 07/28/2022    1.Pt will demo proper deep core coordination without chest breathing and optimal excursion of diaphragm/pelvic floor in order to promote spinal stability and pelvic floor function  Baseline: dyscoordination Goal status: INITIAL  2.  Pt will demo > 5 pt change on FOTO  to improve QOL and function  Urinary Problem baseline  41 pts PFDI Urinary 29 pts   Goal status: INITIAL  3.  Pt will demo proper body mechanics in against gravity tasks and ADLs  work tasks, fitness  to minimize straining pelvic floor / back                  Baseline: not IND, improper form that places strain on pelvic floor                Goal status: INITIAL    4. Pt will demo decreased abdominal separation from 3 fingers width to < 2 fingers width along linea alba in order to improve IAP system   Baseline: 3 fingers width Goal status: INITIAL    5. Pt will demo increased gait speed > 1.3 m/s in order to ambulate safely in community and return to fitness routine  Baseline: 0.9 m/s ( R hip drop, L hip higher, decreased push off on R)  Goal status: INITIAL   6. Pt will demo proper alignment and technique with customized gym workout that adjusts for shoulder injuries / surgery and minimizing urinary dysfunctions.   Baseline:  lack of education for proper workout and currently performing ab exercises that places downward force onto pelvic floor   Goal status: INITIAL     Jerl Mina, PT 06/15/2022, 11:10 AM

## 2022-06-22 ENCOUNTER — Ambulatory Visit: Payer: Medicare HMO | Attending: Urology | Admitting: Physical Therapy

## 2022-06-29 ENCOUNTER — Ambulatory Visit: Payer: Medicare HMO | Admitting: Physical Therapy

## 2022-07-06 ENCOUNTER — Ambulatory Visit: Payer: Medicare HMO | Admitting: Physical Therapy

## 2022-07-09 DIAGNOSIS — E782 Mixed hyperlipidemia: Secondary | ICD-10-CM | POA: Diagnosis not present

## 2022-07-09 DIAGNOSIS — K219 Gastro-esophageal reflux disease without esophagitis: Secondary | ICD-10-CM | POA: Diagnosis not present

## 2022-07-09 DIAGNOSIS — I10 Essential (primary) hypertension: Secondary | ICD-10-CM | POA: Diagnosis not present

## 2022-07-09 DIAGNOSIS — Z0000 Encounter for general adult medical examination without abnormal findings: Secondary | ICD-10-CM | POA: Diagnosis not present

## 2022-07-09 DIAGNOSIS — I2510 Atherosclerotic heart disease of native coronary artery without angina pectoris: Secondary | ICD-10-CM | POA: Diagnosis not present

## 2022-07-10 DIAGNOSIS — I2510 Atherosclerotic heart disease of native coronary artery without angina pectoris: Secondary | ICD-10-CM | POA: Diagnosis not present

## 2022-07-10 DIAGNOSIS — I10 Essential (primary) hypertension: Secondary | ICD-10-CM | POA: Diagnosis not present

## 2022-07-10 DIAGNOSIS — E782 Mixed hyperlipidemia: Secondary | ICD-10-CM | POA: Diagnosis not present

## 2022-07-13 ENCOUNTER — Ambulatory Visit: Payer: Medicare HMO | Admitting: Physical Therapy

## 2022-09-19 DIAGNOSIS — E782 Mixed hyperlipidemia: Secondary | ICD-10-CM | POA: Diagnosis not present

## 2022-09-19 DIAGNOSIS — I10 Essential (primary) hypertension: Secondary | ICD-10-CM | POA: Diagnosis not present

## 2022-10-05 DIAGNOSIS — H903 Sensorineural hearing loss, bilateral: Secondary | ICD-10-CM | POA: Diagnosis not present

## 2022-10-07 DIAGNOSIS — H903 Sensorineural hearing loss, bilateral: Secondary | ICD-10-CM | POA: Diagnosis not present

## 2022-10-29 DIAGNOSIS — R69 Illness, unspecified: Secondary | ICD-10-CM | POA: Diagnosis not present

## 2022-11-09 ENCOUNTER — Ambulatory Visit (INDEPENDENT_AMBULATORY_CARE_PROVIDER_SITE_OTHER): Payer: Medicare HMO | Admitting: Internal Medicine

## 2022-11-09 ENCOUNTER — Encounter: Payer: Self-pay | Admitting: Internal Medicine

## 2022-11-09 VITALS — BP 130/78 | HR 44 | Ht 69.0 in | Wt 186.8 lb

## 2022-11-09 DIAGNOSIS — R69 Illness, unspecified: Secondary | ICD-10-CM | POA: Diagnosis not present

## 2022-11-09 DIAGNOSIS — Z951 Presence of aortocoronary bypass graft: Secondary | ICD-10-CM | POA: Diagnosis not present

## 2022-11-09 DIAGNOSIS — I10 Essential (primary) hypertension: Secondary | ICD-10-CM | POA: Diagnosis not present

## 2022-11-09 DIAGNOSIS — E538 Deficiency of other specified B group vitamins: Secondary | ICD-10-CM | POA: Diagnosis not present

## 2022-11-09 DIAGNOSIS — E782 Mixed hyperlipidemia: Secondary | ICD-10-CM | POA: Diagnosis not present

## 2022-11-09 DIAGNOSIS — R2681 Unsteadiness on feet: Secondary | ICD-10-CM

## 2022-11-09 DIAGNOSIS — F419 Anxiety disorder, unspecified: Secondary | ICD-10-CM

## 2022-11-09 DIAGNOSIS — K449 Diaphragmatic hernia without obstruction or gangrene: Secondary | ICD-10-CM | POA: Diagnosis not present

## 2022-11-09 NOTE — Progress Notes (Signed)
Established Patient Office Visit  Subjective:  Patient ID: James [name], male    DOB: 06/11/1936  Age: 87 y.o. MRN: [id number]  Chief Complaint  Patient presents with   Follow-up    4 month follow up    Patient comes in for his follow-up today.  He is generally feeling well and has no complaints of chest pain, no shortness of breath, and no fatigue. The patient however has noticed that recently he gets a little unsteady on his feet as he is walking around in the stores.  He seems to be having a shuffling gait and takes smaller steps.  He has been getting dizzy intermittently and in the past it has been due to his low blood pressure.  Another thing he notes is that he is getting some tremors in both of his hands.  Patient had mentioned some forgetfulness in the past in which time Aricept was started but he has stopped taking that. Will set up a neurology consult for further evaluation.  Patient will also come back fasting for his lab work.     Past Medical History:  Diagnosis Date   Anxiety    Aortic atherosclerosis (HCC)    Aortic dilatation ([place]) 01/10/2021   a.) CT 01/10/2021: infrarenal aortic dilitation measuring 3.3 x 3.3 cm. b.) CT 06/23/2021: measured 3.4 cm.   Arthritis    B12 deficiency    Basal cell carcinoma 03/14/2019   R nasal tip    Bilateral renal cysts    BPH (benign prostatic hyperplasia)    Bradycardia    Chronic kidney disease    COPD (chronic obstructive pulmonary disease) (HCC)    Coronary artery disease    a.) s/p 2v CABG in 2001; performed while living in [place].   Diastolic dysfunction [id number]   a.) TTE 06/2020: EF 60%; sev LA dilation, mild RA dilation; triv PR, mild to mod TR/MR/AR; G1DD.   Frequency of urination    GERD (gastroesophageal reflux disease)    Hiatal hernia    History of kidney stones    HLD (hyperlipidemia)    Hypertension    Internal hemorrhoids    Long term current use of anticoagulant    a.) Rivaroxaban    Mobitz type 1 second degree AV block    OSA (obstructive sleep apnea)    a.) does not require nocturnal PAP therapy   Osteopenia    Pulmonary HTN ([place]) 06/2020   a.) mild   S/P CABG x 2 2001   a.) details unknown; performed in 2001 in [place].    Social History   Socioeconomic History   Marital status: Widowed    Spouse name: Not on file   Number of children: Not on file   Years of education: Not on file   Highest education level: Not on file  Occupational History   Not on file  Tobacco Use   Smoking status: Former    Packs/day: 1.00    Years: 20.00    Total pack years: 20.00    Types: Cigarettes    Quit date: 09/21/2013    Years since quitting: 9.1    Passive exposure: Past   Smokeless tobacco: Never   Tobacco comments:    quit on and off over the years  Vaping Use   Vaping Use: Never used  Substance and Sexual Activity   Alcohol use: Yes    Comment: socially   Drug use: No   Sexual activity: Yes    Birth control/protection:  None  Other Topics Concern   Not on file  Social History Narrative   Not on file   Social Determinants of Health   Financial Resource Strain: Not on file  Food Insecurity: Not on file  Transportation Needs: Not on file  Physical Activity: Not on file  Stress: Not on file  Social Connections: Not on file  Intimate Partner Violence: Not on file    Family History  Problem Relation Age of Onset   Prostate cancer Neg Hx    Bladder Cancer Neg Hx    Kidney cancer Neg Hx     No Known Allergies  Review of Systems  Constitutional:  Negative for chills, diaphoresis, fever, malaise/fatigue and weight loss.  HENT:  Negative for congestion, ear discharge, ear pain, hearing loss, nosebleeds, sinus pain and tinnitus.   Eyes:  Negative for blurred vision, double vision, photophobia, pain and redness.  Respiratory:  Negative for cough, hemoptysis, sputum production, shortness of breath and wheezing.   Cardiovascular:  Negative for chest pain,  palpitations, orthopnea, leg swelling and PND.  Gastrointestinal:  Negative for abdominal pain, blood in stool, constipation, diarrhea, heartburn, nausea and vomiting.  Genitourinary:  Negative for dysuria, frequency, hematuria and urgency.  Musculoskeletal:  Negative for back pain, falls, joint pain, myalgias and neck pain.  Skin:  Negative for rash.  Neurological:  Positive for dizziness and tremors. Negative for tingling, sensory change, speech change, seizures, weakness and headaches.  Psychiatric/Behavioral:  Positive for memory loss. Negative for depression and substance abuse. The patient is not nervous/anxious and does not have insomnia.        Objective:   BP 130/78   Pulse (!) 44   Ht 5' 9"$  (1.753 m)   Wt 186 lb 12.8 oz (84.7 kg)   SpO2 98%   BMI 27.59 kg/m   Vitals:   11/09/22 0923  BP: 130/78  Pulse: (!) 44  Height: 5' 9"$  (1.753 m)  Weight: 186 lb 12.8 oz (84.7 kg)  SpO2: 98%  BMI (Calculated): 27.57    Physical Exam Vitals and nursing note reviewed.  HENT:     Head: Normocephalic.  Eyes:     Pupils: Pupils are equal, round, and reactive to light.  Cardiovascular:     Rate and Rhythm: Normal rate.     Pulses: Normal pulses.  Pulmonary:     Effort: Pulmonary effort is normal.     Breath sounds: Normal breath sounds.  Abdominal:     General: Abdomen is flat. Bowel sounds are normal.     Palpations: Abdomen is soft.  Musculoskeletal:     Cervical back: Normal range of motion and neck supple.  Skin:    General: Skin is warm and dry.  Neurological:     General: No focal deficit present.     Mental Status: He is alert and oriented to person, place, and time.     Gait: Gait abnormal.  Psychiatric:        Mood and Affect: Mood normal.        Behavior: Behavior normal.      No results found for any visits on 11/09/22.  No results found for this or any previous visit (from the past 2160 hour(s)).    Assessment & Plan:  Patient advised to use a cane  when he is walking outside of his apartment. We will be setting up neurology consult.  Patient will return for his fasting labs. Problem List Items Addressed This Visit  Anxiety   Relevant Medications   citalopram (CELEXA) 20 MG tablet   Essential hypertension   Relevant Medications   rosuvastatin (CRESTOR) 10 MG tablet   Other Relevant Orders   CMP14+EGFR   TSH   Hiatal hernia   Relevant Orders   CBC With Differential   Hx of CABG   Hyperlipidemia   Relevant Medications   rosuvastatin (CRESTOR) 10 MG tablet   Other Relevant Orders   Lipid Panel w/o Chol/HDL Ratio   Vitamin B12 deficiency   Unsteady gait when walking - Primary   Relevant Orders   Ambulatory referral to Neurology   TSH   Sedimentation rate   Vitamin B12    Return in about 3 months (around 02/07/2023).   Total time spent: 30 minutes  Perrin Maltese, MD  11/09/2022

## 2022-11-10 ENCOUNTER — Telehealth: Payer: Self-pay

## 2022-11-10 NOTE — Telephone Encounter (Signed)
Pt called and left vm after appt yesterday asking if the issues with his legs/leg pain, could be similar arthritis issues as he had with the feet swelling he had from about 6 months ago? He just wanted me to run this by you to see what you think, Please advise

## 2022-11-11 ENCOUNTER — Ambulatory Visit: Payer: Medicare HMO | Admitting: Dermatology

## 2022-11-11 ENCOUNTER — Ambulatory Visit (INDEPENDENT_AMBULATORY_CARE_PROVIDER_SITE_OTHER): Payer: Medicare HMO | Admitting: Internal Medicine

## 2022-11-11 ENCOUNTER — Other Ambulatory Visit: Payer: Medicare HMO

## 2022-11-11 VITALS — BP 165/110 | HR 65

## 2022-11-11 DIAGNOSIS — R238 Other skin changes: Secondary | ICD-10-CM | POA: Diagnosis not present

## 2022-11-11 DIAGNOSIS — Z85828 Personal history of other malignant neoplasm of skin: Secondary | ICD-10-CM | POA: Diagnosis not present

## 2022-11-11 DIAGNOSIS — E782 Mixed hyperlipidemia: Secondary | ICD-10-CM

## 2022-11-11 DIAGNOSIS — L820 Inflamed seborrheic keratosis: Secondary | ICD-10-CM

## 2022-11-11 DIAGNOSIS — D0339 Melanoma in situ of other parts of face: Secondary | ICD-10-CM

## 2022-11-11 DIAGNOSIS — H6123 Impacted cerumen, bilateral: Secondary | ICD-10-CM | POA: Diagnosis not present

## 2022-11-11 DIAGNOSIS — L570 Actinic keratosis: Secondary | ICD-10-CM | POA: Diagnosis not present

## 2022-11-11 DIAGNOSIS — D1801 Hemangioma of skin and subcutaneous tissue: Secondary | ICD-10-CM

## 2022-11-11 DIAGNOSIS — L814 Other melanin hyperpigmentation: Secondary | ICD-10-CM

## 2022-11-11 DIAGNOSIS — D229 Melanocytic nevi, unspecified: Secondary | ICD-10-CM | POA: Diagnosis not present

## 2022-11-11 DIAGNOSIS — C439 Malignant melanoma of skin, unspecified: Secondary | ICD-10-CM

## 2022-11-11 DIAGNOSIS — L821 Other seborrheic keratosis: Secondary | ICD-10-CM | POA: Diagnosis not present

## 2022-11-11 DIAGNOSIS — I10 Essential (primary) hypertension: Secondary | ICD-10-CM | POA: Diagnosis not present

## 2022-11-11 DIAGNOSIS — Z1283 Encounter for screening for malignant neoplasm of skin: Secondary | ICD-10-CM | POA: Diagnosis not present

## 2022-11-11 DIAGNOSIS — L578 Other skin changes due to chronic exposure to nonionizing radiation: Secondary | ICD-10-CM

## 2022-11-11 DIAGNOSIS — D485 Neoplasm of uncertain behavior of skin: Secondary | ICD-10-CM

## 2022-11-11 HISTORY — DX: Malignant melanoma of skin, unspecified: C43.9

## 2022-11-11 NOTE — Progress Notes (Signed)
CHIEF COMPLAINT     Ear fullness     REASON FOR VISIT     Ear Wash     ASSESSMENT     Impacted Cerumen     PLAN     Ear Wash performed per plan of care.

## 2022-11-11 NOTE — Patient Instructions (Addendum)
Wound Care Instructions  Cleanse wound gently with soap and water once a day then pat dry with clean gauze. Apply a thin coat of Petrolatum (petroleum jelly, "Vaseline") over the wound (unless you have an allergy to this). We recommend that you use a new, sterile tube of Vaseline. Do not pick or remove scabs. Do not remove the yellow or white "healing tissue" from the base of the wound.  Cover the wound with fresh, clean, nonstick gauze and secure with paper tape. You may use Band-Aids in place of gauze and tape if the wound is small enough, but would recommend trimming much of the tape off as there is often too much. Sometimes Band-Aids can irritate the skin.  You should call the office for your biopsy report after 1 week if you have not already been contacted.  If you experience any problems, such as abnormal amounts of bleeding, swelling, significant bruising, significant pain, or evidence of infection, please call the office immediately.  FOR ADULT SURGERY PATIENTS: If you need something for pain relief you may take 1 extra strength Tylenol (acetaminophen) AND 2 Ibuprofen (200mg each) together every 4 hours as needed for pain. (do not take these if you are allergic to them or if you have a reason you should not take them.) Typically, you may only need pain medication for 1 to 3 days.     Due to recent changes in healthcare laws, you may see results of your pathology and/or laboratory studies on MyChart before the doctors have had a chance to review them. We understand that in some cases there may be results that are confusing or concerning to you. Please understand that not all results are received at the same time and often the doctors may need to interpret multiple results in order to provide you with the best plan of care or course of treatment. Therefore, we ask that you please give us 2 business days to thoroughly review all your results before contacting the office for clarification. Should  we see a critical lab result, you will be contacted sooner.   If You Need Anything After Your Visit  If you have any questions or concerns for your doctor, please call our main line at [phone] and press option 4 to reach your doctor's medical assistant. If no one answers, please leave a voicemail as directed and we will return your call as soon as possible. Messages left after 4 pm will be answered the following business day.   You may also send us a message via MyChart. We typically respond to MyChart messages within 1-2 business days.  For prescription refills, please ask your pharmacy to contact our office. Our fax number is [phone].  If you have an urgent issue when the clinic is closed that cannot wait until the next business day, you can page your doctor at the number below.    Please note that while we do our best to be available for urgent issues outside of office hours, we are not available 24/7.   If you have an urgent issue and are unable to reach us, you may choose to seek medical care at your doctor's office, retail clinic, urgent care center, or emergency room.  If you have a medical emergency, please immediately call 911 or go to the emergency department.  Pager Numbers  - Dr. Kowalski: [phone]  - Dr. Moye: [phone]  - Dr. Stewart: [phone]  In the event of inclement weather, please call our main line at   [phone] for an update on the status of any delays or closures.  Dermatology Medication Tips: Please keep the boxes that topical medications come in in order to help keep track of the instructions about where and how to use these. Pharmacies typically print the medication instructions only on the boxes and not directly on the medication tubes.   If your medication is too expensive, please contact our office at [phone] option 4 or send us a message through MyChart.   We are unable to tell what your co-pay for medications will be in  advance as this is different depending on your insurance coverage. However, we may be able to find a substitute medication at lower cost or fill out paperwork to get insurance to cover a needed medication.   If a prior authorization is required to get your medication covered by your insurance company, please allow us 1-2 business days to complete this process.  Drug prices often vary depending on where the prescription is filled and some pharmacies may offer cheaper prices.  The website www.goodrx.com contains coupons for medications through different pharmacies. The prices here do not account for what the cost may be with help from insurance (it may be cheaper with your insurance), but the website can give you the price if you did not use any insurance.  - You can print the associated coupon and take it with your prescription to the pharmacy.  - You may also stop by our office during regular business hours and pick up a GoodRx coupon card.  - If you need your prescription sent electronically to a different pharmacy, notify our office through  MyChart or by phone at [phone] option 4.     Si Usted Necesita Algo Despus de Su Visita  Tambin puede enviarnos un mensaje a travs de MyChart. Por lo general respondemos a los mensajes de MyChart en el transcurso de 1 a 2 das hbiles.  Para renovar recetas, por favor pida a su farmacia que se ponga en contacto con nuestra oficina. Nuestro nmero de fax es el [phone].  Si tiene un asunto urgente cuando la clnica est cerrada y que no puede esperar hasta el siguiente da hbil, puede llamar/localizar a su doctor(a) al nmero que aparece a continuacin.   Por favor, tenga en cuenta que aunque hacemos todo lo posible para estar disponibles para asuntos urgentes fuera del horario de oficina, no estamos disponibles las 24 horas del da, los 7 das de la semana.   Si tiene un problema urgente y no puede comunicarse con nosotros, puede  optar por buscar atencin mdica  en el consultorio de su doctor(a), en una clnica privada, en un centro de atencin urgente o en una sala de emergencias.  Si tiene una emergencia mdica, por favor llame inmediatamente al 911 o vaya a la sala de emergencias.  Nmeros de bper  - Dr. Kowalski: [phone]  - Dra. Moye: [phone]  - Dra. Stewart: [phone]  En caso de inclemencias del tiempo, por favor llame a nuestra lnea principal al [phone] para una actualizacin sobre el estado de cualquier retraso o cierre.  Consejos para la medicacin en dermatologa: Por favor, guarde las cajas en las que vienen los medicamentos de uso tpico para ayudarle a seguir las instrucciones sobre dnde y cmo usarlos. Las farmacias generalmente imprimen las instrucciones del medicamento slo en las cajas y no directamente en los tubos del medicamento.   Si su medicamento es muy caro, por favor, pngase en contacto con   nuestra oficina llamando al [phone] y presione la opcin 4 o envenos un mensaje a travs de MyChart.   No podemos decirle cul ser su copago por los medicamentos por adelantado ya que esto es diferente dependiendo de la cobertura de su seguro. Sin embargo, es posible que podamos encontrar un medicamento sustituto a menor costo o llenar un formulario para que el seguro cubra el medicamento que se considera necesario.   Si se requiere una autorizacin previa para que su compaa de seguros cubra su medicamento, por favor permtanos de 1 a 2 das hbiles para completar este proceso.  Los precios de los medicamentos varan con frecuencia dependiendo del lugar de dnde se surte la receta y alguna farmacias pueden ofrecer precios ms baratos.  El sitio web www.goodrx.com tiene cupones para medicamentos de diferentes farmacias. Los precios aqu no tienen en cuenta lo que podra costar con la ayuda del seguro (puede ser ms barato con su seguro), pero el sitio web puede darle el  precio si no utiliz ningn seguro.  - Puede imprimir el cupn correspondiente y llevarlo con su receta a la farmacia.  - Tambin puede pasar por nuestra oficina durante el horario de atencin regular y recoger una tarjeta de cupones de GoodRx.  - Si necesita que su receta se enve electrnicamente a una farmacia diferente, informe a nuestra oficina a travs de MyChart de [facility] o por telfono llamando al [phone] y presione la opcin 4.  

## 2022-11-11 NOTE — Progress Notes (Signed)
Follow-Up Visit   Subjective  James [name] is a 87 y.o. male who presents for the following: Actinic Keratosis (Patient is here today to check for precancerous skin lesion ). The patient presents for Total-Body Skin Exam (TBSE) for skin cancer screening and mole check.  The patient has spots, moles and lesions to be evaluated, some may be new or changing and the patient has concerns that these could be cancer.  The following portions of the chart were reviewed this encounter and updated as appropriate:   Tobacco  Allergies  Meds  Problems  Med Hx  Surg Hx  Fam Hx     Review of Systems:  No other skin or systemic complaints except as noted in HPI or Assessment and Plan.  Objective  Well appearing patient in no apparent distress; mood and affect are within normal limits.  A focused examination was performed including the face, scalp, arms, and hands. Relevant physical exam findings are noted in the Assessment and Plan.  Right Ear Purple macule.  R infra orbital Brown patch 1.0 x 2.0 cm     Face and scalp x 15 (15) Erythematous thin papules/macules with gritty scale.   Face x 7 (7) Erythematous stuck-on, waxy papule or plaque   Assessment & Plan  Venous lake Right Ear Benign-appearing.  Observation.  Call clinic for new or changing lesions.  Recommend daily use of broad spectrum spf 30+ sunscreen to sun-exposed areas.   Neoplasm of uncertain behavior of skin R infra orbital Skin / nail biopsy Type of biopsy: tangential   Informed consent: discussed and consent obtained   Timeout: patient name, date of birth, surgical site, and procedure verified   Procedure prep:  Patient was prepped and draped in usual sterile fashion Prep type:  Isopropyl alcohol Anesthesia: the lesion was anesthetized in a standard fashion   Anesthetic:  1% lidocaine w/ epinephrine 1-100,000 buffered w/ 8.4% NaHCO3 Instrument used: flexible razor blade   Hemostasis achieved with:  pressure, aluminum chloride and electrodesiccation   Outcome: patient tolerated procedure well   Post-procedure details: sterile dressing applied and wound care instructions given   Dressing type: bandage and petrolatum    Specimen 1 - Surgical pathology Differential Diagnosis: D48.5 r/o lentigo vs MM vs other Check Margins: No  AK (actinic keratosis) Face and scalp x 15 Destruction of lesion - Face and scalp x 15 Complexity: simple   Destruction method: cryotherapy   Informed consent: discussed and consent obtained   Timeout:  patient name, date of birth, surgical site, and procedure verified Lesion destroyed using liquid nitrogen: Yes   Region frozen until ice ball extended beyond lesion: Yes   Outcome: patient tolerated procedure well with no complications   Post-procedure details: wound care instructions given    Inflamed seborrheic keratosis Face x 7 Symptomatic, irritating, patient would like treated. Destruction of lesion - Face x 7 Complexity: simple   Destruction method: cryotherapy   Informed consent: discussed and consent obtained   Timeout:  patient name, date of birth, surgical site, and procedure verified Lesion destroyed using liquid nitrogen: Yes   Region frozen until ice ball extended beyond lesion: Yes   Outcome: patient tolerated procedure well with no complications   Post-procedure details: wound care instructions given    Lentigines - Scattered tan macules - Due to sun exposure - Benign-appearing, observe - Recommend daily broad spectrum sunscreen SPF 30+ to sun-exposed areas, reapply every 2 hours as needed. - Call for any changes  Seborrheic  Keratoses - Stuck-on, waxy, tan-brown papules and/or plaques  - Benign-appearing - Discussed benign etiology and prognosis. - Observe - Call for any changes  Melanocytic Nevi - Tan-brown and/or pink-flesh-colored symmetric macules and papules - Benign appearing on exam today - Observation - Call clinic  for new or changing moles - Recommend daily use of broad spectrum spf 30+ sunscreen to sun-exposed areas.   Hemangiomas - Red papules - Discussed benign nature - Observe - Call for any changes  Actinic Damage - Chronic condition, secondary to cumulative UV/sun exposure - diffuse scaly erythematous macules with underlying dyspigmentation - Recommend daily broad spectrum sunscreen SPF 30+ to sun-exposed areas, reapply every 2 hours as needed.  - Staying in the shade or wearing long sleeves, sun glasses (UVA+UVB protection) and wide brim hats (4-inch brim around the entire circumference of the hat) are also recommended for sun protection.  - Call for new or changing lesions.  History of Basal Cell Carcinoma of the Skin - No evidence of recurrence today - Recommend regular full body skin exams - Recommend daily broad spectrum sunscreen SPF 30+ to sun-exposed areas, reapply every 2 hours as needed.  - Call if any new or changing lesions are noted between office visits  Skin cancer screening performed today.  Return in about 6 months (around 05/12/2023) for TBSE.  Luther Redo, CMA, am acting as scribe for Sarina Ser, MD . Documentation: I have reviewed the above documentation for accuracy and completeness, and I agree with the above.  Sarina Ser, MD

## 2022-11-12 LAB — CMP14+EGFR
ALT: 20 IU/L (ref 0–44)
AST: 26 IU/L (ref 0–40)
Albumin/Globulin Ratio: 1.9 (ref 1.2–2.2)
Albumin: 4.4 g/dL (ref 3.7–4.7)
Alkaline Phosphatase: 81 IU/L (ref 44–121)
BUN/Creatinine Ratio: 22 (ref 10–24)
BUN: 19 mg/dL (ref 8–27)
Bilirubin Total: 0.7 mg/dL (ref 0.0–1.2)
CO2: 20 mmol/L (ref 20–29)
Calcium: 9.7 mg/dL (ref 8.6–10.2)
Chloride: 105 mmol/L (ref 96–106)
Creatinine, Ser: 0.88 mg/dL (ref 0.76–1.27)
Globulin, Total: 2.3 g/dL (ref 1.5–4.5)
Glucose: 92 mg/dL (ref 70–99)
Potassium: 4.1 mmol/L (ref 3.5–5.2)
Sodium: 142 mmol/L (ref 134–144)
Total Protein: 6.7 g/dL (ref 6.0–8.5)
eGFR: 85 mL/min/{1.73_m2} (ref >59)

## 2022-11-12 LAB — CBC WITH DIFFERENTIAL
Basophils Absolute: 0.1 10*3/uL (ref 0.0–0.2)
Basos: 1 %
EOS (ABSOLUTE): 0.1 10*3/uL (ref 0.0–0.4)
Eos: 2 %
Hematocrit: 41.9 % (ref 37.5–51.0)
Hemoglobin: 14.2 g/dL (ref 13.0–17.7)
Immature Grans (Abs): 0 10*3/uL (ref 0.0–0.1)
Immature Granulocytes: 0 %
Lymphocytes Absolute: 1.2 10*3/uL (ref 0.7–3.1)
Lymphs: 25 %
MCH: 32.1 pg (ref 26.6–33.0)
MCHC: 33.9 g/dL (ref 31.5–35.7)
MCV: 95 fL (ref 79–97)
Monocytes Absolute: 0.4 10*3/uL (ref 0.1–0.9)
Monocytes: 9 %
Neutrophils Absolute: 3.2 10*3/uL (ref 1.4–7.0)
Neutrophils: 63 %
RBC: 4.42 x10E6/uL (ref 4.14–5.80)
RDW: 12.6 % (ref 11.6–15.4)
WBC: 5 10*3/uL (ref 3.4–10.8)

## 2022-11-12 LAB — LIPID PANEL
Chol/HDL Ratio: 1.9 ratio (ref 0.0–5.0)
Cholesterol, Total: 127 mg/dL (ref 100–199)
HDL: 68 mg/dL (ref >39)
LDL Chol Calc (NIH): 47 mg/dL (ref 0–99)
Triglycerides: 52 mg/dL (ref 0–149)
VLDL Cholesterol Cal: 12 mg/dL (ref 5–40)

## 2022-11-17 ENCOUNTER — Ambulatory Visit (INDEPENDENT_AMBULATORY_CARE_PROVIDER_SITE_OTHER): Payer: Medicare HMO | Admitting: Dermatology

## 2022-11-17 ENCOUNTER — Encounter: Payer: Self-pay | Admitting: Dermatology

## 2022-11-17 ENCOUNTER — Telehealth: Payer: Self-pay

## 2022-11-17 DIAGNOSIS — Z79899 Other long term (current) drug therapy: Secondary | ICD-10-CM | POA: Diagnosis not present

## 2022-11-17 DIAGNOSIS — D0339 Melanoma in situ of other parts of face: Secondary | ICD-10-CM | POA: Diagnosis not present

## 2022-11-17 DIAGNOSIS — Z7189 Other specified counseling: Secondary | ICD-10-CM | POA: Diagnosis not present

## 2022-11-17 DIAGNOSIS — D0330 Melanoma in situ of unspecified part of face: Secondary | ICD-10-CM

## 2022-11-17 NOTE — Telephone Encounter (Signed)
Contacted patient and appointment scheduled today for 4:30 pm.

## 2022-11-17 NOTE — Patient Instructions (Signed)
Cryotherapy Aftercare  Wash gently with soap and water everyday.   Apply Vaseline daily until healed.    Due to recent changes in healthcare laws, you may see results of your pathology and/or laboratory studies on MyChart before the doctors have had a chance to review them. We understand that in some cases there may be results that are confusing or concerning to you. Please understand that not all results are received at the same time and often the doctors may need to interpret multiple results in order to provide you with the best plan of care or course of treatment. Therefore, we ask that you please give us 2 business days to thoroughly review all your results before contacting the office for clarification. Should we see a critical lab result, you will be contacted sooner.   If You Need Anything After Your Visit  If you have any questions or concerns for your doctor, please call our main line at [phone] and press option 4 to reach your doctor's medical assistant. If no one answers, please leave a voicemail as directed and we will return your call as soon as possible. Messages left after 4 pm will be answered the following business day.   You may also send us a message via MyChart. We typically respond to MyChart messages within 1-2 business days.  For prescription refills, please ask your pharmacy to contact our office. Our fax number is [phone].  If you have an urgent issue when the clinic is closed that cannot wait until the next business day, you can page your doctor at the number below.    Please note that while we do our best to be available for urgent issues outside of office hours, we are not available 24/7.   If you have an urgent issue and are unable to reach us, you may choose to seek medical care at your doctor's office, retail clinic, urgent care center, or emergency room.  If you have a medical emergency, please immediately call 911 or go to the emergency  department.  Pager Numbers  - Dr. Kowalski: [phone]  - Dr. Moye: [phone]  - Dr. Stewart: [phone]  In the event of inclement weather, please call our main line at [phone] for an update on the status of any delays or closures.  Dermatology Medication Tips: Please keep the boxes that topical medications come in in order to help keep track of the instructions about where and how to use these. Pharmacies typically print the medication instructions only on the boxes and not directly on the medication tubes.   If your medication is too expensive, please contact our office at [phone] option 4 or send us a message through MyChart.   We are unable to tell what your co-pay for medications will be in advance as this is different depending on your insurance coverage. However, we may be able to find a substitute medication at lower cost or fill out paperwork to get insurance to cover a needed medication.   If a prior authorization is required to get your medication covered by your insurance company, please allow us 1-2 business days to complete this process.  Drug prices often vary depending on where the prescription is filled and some pharmacies may offer cheaper prices.  The website www.goodrx.com contains coupons for medications through different pharmacies. The prices here do not account for what the cost may be with help from insurance (it may be cheaper with your insurance), but the website can give you the   give you the price if you did not use any insurance.  - You can print the associated coupon and take it with your prescription to the pharmacy.  - You may also stop by our office during regular business hours and pick up a GoodRx coupon card.  - If you need your prescription sent electronically to a different pharmacy, notify our office through [place] MyChart or by phone at [phone] option 4.     Si Usted Necesita Algo Despus de Su Visita  Tambin puede  enviarnos un mensaje a travs de MyChart. Por lo general respondemos a los mensajes de MyChart en el transcurso de 1 a 2 das hbiles.  Para renovar recetas, por favor pida a su farmacia que se ponga en contacto con nuestra oficina. Nuestro nmero de fax es el [phone].  Si tiene un asunto urgente cuando la clnica est cerrada y que no puede esperar hasta el siguiente da hbil, puede llamar/localizar a su doctor(a) al nmero que aparece a continuacin.   Por favor, tenga en cuenta que aunque hacemos todo lo posible para estar disponibles para asuntos urgentes fuera del horario de oficina, no estamos disponibles las 24 horas del da, los 7 das de la semana.   Si tiene un problema urgente y no puede comunicarse con nosotros, puede optar por buscar atencin mdica  en el consultorio de su doctor(a), en una clnica privada, en un centro de atencin urgente o en una sala de emergencias.  Si tiene una emergencia mdica, por favor llame inmediatamente al 911 o vaya a la sala de emergencias.  Nmeros de bper  - Dr. Kowalski: [phone]  - Dra. Moye: [phone]  - Dra. Stewart: [phone]  En caso de inclemencias del tiempo, por favor llame a nuestra lnea principal al [phone] para una actualizacin sobre el estado de cualquier retraso o cierre.  Consejos para la medicacin en dermatologa: Por favor, guarde las cajas en las que vienen los medicamentos de uso tpico para ayudarle a seguir las instrucciones sobre dnde y cmo usarlos. Las farmacias generalmente imprimen las instrucciones del medicamento slo en las cajas y no directamente en los tubos del medicamento.   Si su medicamento es muy caro, por favor, pngase en contacto con nuestra oficina llamando al [phone] y presione la opcin 4 o envenos un mensaje a travs de MyChart.   No podemos decirle cul ser su copago por los medicamentos por adelantado ya que esto es diferente dependiendo de la cobertura de su seguro.  Sin embargo, es posible que podamos encontrar un medicamento sustituto a menor costo o llenar un formulario para que el seguro cubra el medicamento que se considera necesario.   Si se requiere una autorizacin previa para que su compaa de seguros cubra su medicamento, por favor permtanos de 1 a 2 das hbiles para completar este proceso.  Los precios de los medicamentos varan con frecuencia dependiendo del lugar de dnde se surte la receta y alguna farmacias pueden ofrecer precios ms baratos.  El sitio web www.goodrx.com tiene cupones para medicamentos de diferentes farmacias. Los precios aqu no tienen en cuenta lo que podra costar con la ayuda del seguro (puede ser ms barato con su seguro), pero el sitio web puede darle el precio si no utiliz ningn seguro.  - Puede imprimir el cupn correspondiente y llevarlo con su receta a la farmacia.  - Tambin puede pasar por nuestra oficina durante el horario de atencin regular y recoger una tarjeta de cupones de GoodRx.  -   Si necesita que su receta se enve electrnicamente a una farmacia diferente, informe a nuestra oficina a travs de MyChart de [place] o por telfono llamando al [phone] y presione la opcin 4.  

## 2022-11-17 NOTE — Progress Notes (Signed)
Follow-Up Visit   Subjective  James [name] is a 87 y.o. male who presents for the following: Skin Cancer (Here to discuss pathology results and treatment options. Right infraorbital. MIS).  The following portions of the chart were reviewed this encounter and updated as appropriate:  Tobacco  Allergies  Meds  Problems  Med Hx  Surg Hx  Fam Hx     Review of Systems: No other skin or systemic complaints except as noted in HPI or Assessment and Plan.  Objective  Well appearing patient in no apparent distress; mood and affect are within normal limits.  A focused examination was performed including face. Relevant physical exam findings are noted in the Assessment and Plan.  right infraorbital Ulcerated biopsy site with surrounding brown pigment 1.0 x 2.0 cm      Assessment & Plan   COUNSELING: Melanoma Matrix Counseling and Coordination of Care  Discussed diagnosis in detail including significance of melanoma diagnosis which can be potentially lethal.  Discussed treatment recommendations in detail advising that treatment recommendations are based on longitudinal studies and retrospective studies and are nationwide protocols.  Advised there is always potential for melanoma recurrence even after definitive treatment.  After definitive treatment, we recommend Skin Cancer Screening Exams (with total-body skin exams) every 3 months for a year; then every 4 months for a year; then every 6 months for 3 years.  At 5 years post treatment, if all appears well,  we would recommend at least yearly Skin Cancer Screenings (with total-body skin exams) for the rest of your life.  The patient was given time for questions and these were answered.  We recommend frequent self skin examinations; photoprotection with sunscreen, sun protective clothing, hats, sunglasses and sun avoidance.  If the patient notices any new or changing skin lesions the patient should return to the office immediately  for evaluation.   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  Due to patient's age, and superficial nature, and likelihood that even if this was not treated at all, it would not likely lead to his demise, and I decided on local destructive therapy followed by topical chemotherapy with imiquimod. He is advised and understands if this does not seem to help, surgery will be recommended.  Destruction of lesion Complexity: simple   Destruction method: cryotherapy   Informed consent: discussed and consent obtained   Timeout:  patient name, date of birth, surgical site, and procedure verified Patient was prepped and draped in usual sterile fashion: area prepped with alcohol. Anesthesia: the lesion was anesthetized in a standard fashion   Anesthetic:  1% lidocaine w/ epinephrine 1-100,000 local infiltration Lesion destroyed using liquid nitrogen: Yes   Region frozen until ice ball extended beyond lesion: Yes   Final wound size (cm):  3.1 Hemostasis achieved with:  pressure, aluminum chloride and electrodesiccation Outcome: patient tolerated procedure well with no complications   Post-procedure details: wound care instructions given   Post-procedure details comment:  Ointment and small bandage applied Additional details:  Prior to procedure, discussed risks of blister formation, small wound, skin dyspigmentation, or rare scar following cryotherapy. Recommend Vaseline ointment to treated areas while healing.  Plan to start Imiquimod at follow up.   Return in about 6 weeks (around 12/29/2022) for MIS Follow Up.  I, Emelia Salisbury, CMA, am acting as scribe for Sarina Ser, MD. Documentation: I have reviewed the above documentation for accuracy and  completeness, and I agree with the above.  Sarina Ser, MD

## 2022-11-17 NOTE — Telephone Encounter (Signed)
-----   Message from Ralene Bathe, MD sent at 11/16/2022  1:32 PM EST ----- Diagnosis Skin , right infra orbital MALIGNANT MELANOMA IN SITU, PERIPHERAL MARGIN INVOLVED, SEE DESCRIPTION  Cancer - Melanoma in situ Superficial and early -- also on sun exposed skin Treatment options include surgery or topical chemotherapy cream. Called pt to discuss above, but no answer.  Left message that we would call back and make appt to discuss face to face.  Please call pt and make appt to discuss diagnosis and treatment options.

## 2022-11-24 ENCOUNTER — Encounter: Payer: Self-pay | Admitting: Dermatology

## 2022-11-25 ENCOUNTER — Other Ambulatory Visit: Payer: Self-pay | Admitting: Cardiovascular Disease

## 2022-11-25 DIAGNOSIS — I10 Essential (primary) hypertension: Secondary | ICD-10-CM

## 2022-12-02 ENCOUNTER — Telehealth: Payer: Self-pay

## 2022-12-02 NOTE — Telephone Encounter (Signed)
HTN Review Call  Madelia  39 years, Male  DOB: Sep 05, 1936  M:   __________________________________________________ Hypertension Review (HC) Chart Review BP #1 reading (last): 130/78 on: 11/09/2022 BP #2 reading: 165/110 on: 11/11/2022 BP #3 reading: 134/83 on: 09/10/2022 Any of the last 3 BP > 140/90 mmHg?: Yes What recent interventions have been made by any provider to improve the patient's conditions in the last 3 months?: Office Visit: 11/09/22 Perrin Maltese MD. For follow-up. STOPPED Atorvastatin. 11/11/22 Perrin Maltese MD. For follow-up. No medication changes. Consults: 11/11/22 Ezekiel Slocumb, MD. For follow-up. No medication changes. 11/17/22 Ezekiel Slocumb, MD. For follow-up. No medication changes. Has there been any documented recent hospitalizations or ED visits since last visit with Clinical Lead?: No Adherence Review Does the [facility] have access to medication refill data?: Yes Adherence rates for STAR metric medications: Enalapril 20 mg - 11/25/22 90 DS Rosuvastatin 10 mg - 11/26/22 90 DS Adherence rates for medications indicated for disease state being reviewed: Enalapril 20 mg - 11/25/22 90 DS Does the patient have >5 day gap between last estimated fill dates for any of the above medications?: No Disease State Questions Able to connect with the Patient?: Yes Is the patient monitoring his/her BP?: Yes How often are you checking your BP?: occasionally Home BP Reading #1 (most recent): 120/78 Is the patient having any low BP Readings <90/60?: No Is the patient having any BP readings above >180/100?: No Is the patient's average BP>140/90?: No What is your blood pressure goal?: 120/80 Educate patient to inform proper points on checking BP at home:: Sit with feet flat on the floor, arm at heart level., Do not drink caffeine or smoke a cigarette at least 30 min. prior to checking. What diet changes have you made to improve your Blood Pressure Control?: eating  more home-cooked meals What exercise are you doing to improve your Blood Pressure Control?: no formal exercise Engagement Notes Charlann Lange on 12/02/2022 09:59 AM HC Chart Review: 10 min 12/02/22 HC Assessment call time spent: 5 min 12/02/22   Clinical Lead Review Review Adherence gaps identified?: No Drug Therapy Problems identified?: No Assessment: Uncontrolled Plan: BP may still be elevated, will check at F/U visit June Jennalynn Rivard, PharmD  Reviewed: 67mns

## 2022-12-21 DIAGNOSIS — R42 Dizziness and giddiness: Secondary | ICD-10-CM | POA: Diagnosis not present

## 2022-12-21 DIAGNOSIS — Z7689 Persons encountering health services in other specified circumstances: Secondary | ICD-10-CM | POA: Diagnosis not present

## 2022-12-21 DIAGNOSIS — R413 Other amnesia: Secondary | ICD-10-CM | POA: Diagnosis not present

## 2022-12-21 DIAGNOSIS — R2689 Other abnormalities of gait and mobility: Secondary | ICD-10-CM | POA: Diagnosis not present

## 2022-12-30 ENCOUNTER — Ambulatory Visit: Payer: Medicare HMO | Admitting: Dermatology

## 2022-12-30 VITALS — BP 161/90

## 2022-12-30 DIAGNOSIS — Z7189 Other specified counseling: Secondary | ICD-10-CM

## 2022-12-30 DIAGNOSIS — Z5111 Encounter for antineoplastic chemotherapy: Secondary | ICD-10-CM | POA: Diagnosis not present

## 2022-12-30 DIAGNOSIS — Z79899 Other long term (current) drug therapy: Secondary | ICD-10-CM

## 2022-12-30 DIAGNOSIS — D0339 Melanoma in situ of other parts of face: Secondary | ICD-10-CM | POA: Diagnosis not present

## 2022-12-30 MED ORDER — IMIQUIMOD 5 % EX CREA: TOPICAL_CREAM | CUTANEOUS | 0 refills | Status: DC

## 2022-12-30 NOTE — Patient Instructions (Addendum)
Start Imiquimod 5% cream to affected area on right infraorbital 5 nights a week, Monday through Friday, a small amount to cover the area  Reviewed expected reaction when using imiquimod cream, including irritation and mild inflammation and risk of erosions or more severe inflammation. Reviewed not to apply this in an area larger than 4 x 4 inches to avoid flu-like symptoms. Only a thin layer is required. Reviewed if too much irritation occurs, ensure application of only a thin layer and decrease frequency slightly to achieve a tolerable level of inflammation.     Due to recent changes in healthcare laws, you may see results of your pathology and/or laboratory studies on MyChart before the doctors have had a chance to review them. We understand that in some cases there may be results that are confusing or concerning to you. Please understand that not all results are received at the same time and often the doctors may need to interpret multiple results in order to provide you with the best plan of care or course of treatment. Therefore, we ask that you please give [place] 2 business days to thoroughly review all your results before contacting the office for clarification. Should we see a critical lab result, you will be contacted sooner.   If You Need Anything After Your Visit  If you have any questions or concerns for your doctor, please call our main line at [phone] and press option 4 to reach your doctor's medical assistant. If no one answers, please leave a voicemail as directed and we will return your call as soon as possible. Messages left after 4 pm will be answered the following business day.   You may also send [place] a message via MyChart. We typically respond to MyChart messages within 1-2 business days.  For prescription refills, please ask your pharmacy to contact our office. Our fax number is [phone].  If you have an urgent issue when the clinic is closed that cannot wait until the next  business day, you can page your doctor at the number below.    Please note that while we do our best to be available for urgent issues outside of office hours, we are not available 24/7.   If you have an urgent issue and are unable to reach [place], you may choose to seek medical care at your doctor's office, retail clinic, urgent care center, or emergency room.  If you have a medical emergency, please immediately call 911 or go to the emergency department.  Pager Numbers  - Dr. Gwen Pounds: [phone]  - Dr. Neale Burly: [phone]  - Dr. Roseanne Reno: [phone]  In the event of inclement weather, please call our main line at [phone] for an update on the status of any delays or closures.  Dermatology Medication Tips: Please keep the boxes that topical medications come in in order to help keep track of the instructions about where and how to use these. Pharmacies typically print the medication instructions only on the boxes and not directly on the medication tubes.   If your medication is too expensive, please contact our office at [phone] option 4 or send [place] a message through MyChart.   We are unable to tell what your co-pay for medications will be in advance as this is different depending on your insurance coverage. However, we may be able to find a substitute medication at lower cost or fill out paperwork to get insurance to cover a needed medication.   If a prior authorization is required to get  your medication covered by your insurance company, please allow [place] 1-2 business days to complete this process.  Drug prices often vary depending on where the prescription is filled and some pharmacies may offer cheaper prices.  The website www.goodrx.com contains coupons for medications through different pharmacies. The prices here do not account for what the cost may be with help from insurance (it may be cheaper with your insurance), but the website can give you the price if you did not use  any insurance.  - You can print the associated coupon and take it with your prescription to the pharmacy.  - You may also stop by our office during regular business hours and pick up a GoodRx coupon card.  - If you need your prescription sent electronically to a different pharmacy, notify our office through [facility] or by phone at [phone] option 4.     Si Usted Necesita Algo Despus de Su Visita  Tambin puede enviarnos un mensaje a travs de Pharmacist, community. Por lo general respondemos a los mensajes de MyChart en el transcurso de 1 a 2 das hbiles.  Para renovar recetas, por favor pida a su farmacia que se ponga en contacto con nuestra oficina. Harland Dingwall de fax es [place] [phone].  Si tiene un asunto urgente cuando la clnica est cerrada y que no puede esperar hasta el siguiente da hbil, puede llamar/localizar a su doctor(a) al nmero que aparece a continuacin.   Por favor, tenga en cuenta que aunque hacemos todo lo posible para estar disponibles para asuntos urgentes fuera del horario de [place], no estamos disponibles las 24 horas del da, los 7 das de la [place].   Si tiene un problema urgente y no puede comunicarse con nosotros, puede optar por buscar atencin mdica  en el consultorio de su doctor(a), en una clnica privada, en un centro de atencin urgente o en una sala de emergencias.  Si tiene Engineering geologist, por favor llame inmediatamente al 911 o vaya a la sala de emergencias.  Nmeros de bper  - Dr. Nehemiah Massed: [phone]  - Dra. Moye: [phone]  - Dra. Nicole Kindred: [phone]  En caso de inclemencias del [place], por favor llame a Johnsie Kindred principal al [phone] para una actualizacin sobre el [place] de cualquier retraso o cierre.  Consejos para la medicacin en dermatologa: Por favor, guarde las cajas en las que vienen los medicamentos de uso tpico para ayudarle a seguir las instrucciones sobre dnde y cmo usarlos. Las farmacias  generalmente imprimen las instrucciones del medicamento slo en las cajas y no directamente en los tubos del [place].   Si su medicamento es muy caro, por favor, pngase en contacto con Zigmund Daniel llamando al [phone] y presione la opcin 4 o envenos un mensaje a travs de Pharmacist, community.   No podemos decirle cul ser su copago por los medicamentos por adelantado ya que esto es diferente dependiendo de la cobertura de su seguro. Sin embargo, es posible que podamos encontrar un medicamento sustituto a Electrical engineer un formulario para que el seguro cubra el medicamento que se considera necesario.   Si se requiere una autorizacin previa para que su compaa de seguros [place] su medicamento, por favor permtanos de 1 a 2 das hbiles para completar este proceso.  Los precios de los medicamentos varan con frecuencia dependiendo del Environmental consultant de dnde se surte la receta y alguna farmacias pueden ofrecer precios ms baratos.  El sitio web www.goodrx.com tiene cupones para medicamentos de Airline pilot. Los  precios aqu no tienen en cuenta lo que podra costar con la ayuda del seguro (puede ser ms barato con su seguro), pero el sitio web puede darle el precio si no utiliz Research scientist (physical sciences).  - Puede imprimir el cupn correspondiente y llevarlo con su receta a la farmacia.  - Tambin puede pasar por nuestra oficina durante el horario de atencin regular y Charity fundraiser una tarjeta de cupones de GoodRx.  - Si necesita que su receta se enve electrnicamente a una farmacia diferente, informe a nuestra oficina a travs de MyChart de [place] o por telfono llamando al [phone] y presione la opcin 4.

## 2022-12-30 NOTE — Progress Notes (Signed)
   Follow-Up Visit   Subjective  James [name] is a 87 y.o. male who presents for the following: Melanoma IS R infraorbital 6wk f/u, LN2 at last visit  The following portions of the chart were reviewed this encounter and updated as appropriate: medications, allergies, medical history  Review of Systems:  No other skin or systemic complaints except as noted in HPI or Assessment and Plan.  Objective  Well appearing patient in no apparent distress; mood and affect are within normal limits.  A focused examination was performed of the following areas: face  Relevant exam findings are noted in the Assessment and Plan.  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 Due to the low aggressive nature of these lentigo maligna types, and the patient's age, and the size of the lesion which would require a large area of the face to be removed, the patient decided on liquid donation instruction followed by topical chemotherapy as a treatment of choice.  He understands he may need further surgical procedure if the treatment does not work. R infraorbital Exam: hypopigmentation and brown discoloration around the edge see photo  Treatment Plan: Start Imiquimod 5% cr 5d/wk Monday - Friday to aa R infraorbital  Reviewed expected reaction when using imiquimod cream, including irritation and mild inflammation and risk of erosions or more severe inflammation. Reviewed not to apply this in an area larger than 4 x 4 inches to avoid flu-like symptoms. Only a thin layer is required. Reviewed if too much irritation occurs, ensure application of only a thin layer and decrease frequency slightly to achieve a tolerable level of inflammation.    Return in about 2 months (around 03/01/2023) for recheck Melanoma IS.  I, Ardis Rowan, RMA, am acting as scribe for Armida Sans, MD .  Documentation: I have  reviewed the above documentation for accuracy and completeness, and I agree with the above.  Armida Sans, MD

## 2023-01-14 ENCOUNTER — Ambulatory Visit: Payer: Medicare HMO | Admitting: Urology

## 2023-01-17 ENCOUNTER — Encounter: Payer: Self-pay | Admitting: Dermatology

## 2023-01-27 ENCOUNTER — Other Ambulatory Visit: Payer: Self-pay | Admitting: Internal Medicine

## 2023-01-27 DIAGNOSIS — F411 Generalized anxiety disorder: Secondary | ICD-10-CM

## 2023-01-29 DIAGNOSIS — H524 Presbyopia: Secondary | ICD-10-CM | POA: Diagnosis not present

## 2023-01-29 DIAGNOSIS — Z0100 Encounter for examination of eyes and vision without abnormal findings: Secondary | ICD-10-CM | POA: Diagnosis not present

## 2023-02-04 ENCOUNTER — Emergency Department: Payer: Medicare HMO

## 2023-02-04 ENCOUNTER — Emergency Department
Admission: EM | Admit: 2023-02-04 | Discharge: 2023-02-05 | Disposition: A | Discharge status: Home / Self Care | Payer: Medicare HMO | Attending: Emergency Medicine | Admitting: Emergency Medicine

## 2023-02-04 ENCOUNTER — Other Ambulatory Visit: Payer: Self-pay | Admitting: Dermatology

## 2023-02-04 ENCOUNTER — Other Ambulatory Visit: Payer: Self-pay | Admitting: Cardiovascular Disease

## 2023-02-04 ENCOUNTER — Telehealth: Payer: Self-pay

## 2023-02-04 DIAGNOSIS — S06300A Unspecified focal traumatic brain injury without loss of consciousness, initial encounter: Secondary | ICD-10-CM

## 2023-02-04 DIAGNOSIS — F32A Depression, unspecified: Secondary | ICD-10-CM | POA: Insufficient documentation

## 2023-02-04 DIAGNOSIS — R58 Hemorrhage, not elsewhere classified: Secondary | ICD-10-CM | POA: Diagnosis not present

## 2023-02-04 DIAGNOSIS — Z951 Presence of aortocoronary bypass graft: Secondary | ICD-10-CM | POA: Insufficient documentation

## 2023-02-04 DIAGNOSIS — S06360A Traumatic hemorrhage of cerebrum, unspecified, without loss of consciousness, initial encounter: Secondary | ICD-10-CM | POA: Diagnosis not present

## 2023-02-04 DIAGNOSIS — G919 Hydrocephalus, unspecified: Secondary | ICD-10-CM | POA: Diagnosis not present

## 2023-02-04 DIAGNOSIS — I129 Hypertensive chronic kidney disease with stage 1 through stage 4 chronic kidney disease, or unspecified chronic kidney disease: Secondary | ICD-10-CM | POA: Diagnosis not present

## 2023-02-04 DIAGNOSIS — S0990XA Unspecified injury of head, initial encounter: Secondary | ICD-10-CM | POA: Diagnosis not present

## 2023-02-04 DIAGNOSIS — G319 Degenerative disease of nervous system, unspecified: Secondary | ICD-10-CM | POA: Diagnosis not present

## 2023-02-04 DIAGNOSIS — N189 Chronic kidney disease, unspecified: Secondary | ICD-10-CM | POA: Insufficient documentation

## 2023-02-04 DIAGNOSIS — T1490XA Injury, unspecified, initial encounter: Secondary | ICD-10-CM | POA: Diagnosis not present

## 2023-02-04 DIAGNOSIS — W19XXXA Unspecified fall, initial encounter: Secondary | ICD-10-CM | POA: Diagnosis not present

## 2023-02-04 DIAGNOSIS — I10 Essential (primary) hypertension: Secondary | ICD-10-CM | POA: Diagnosis not present

## 2023-02-04 DIAGNOSIS — J449 Chronic obstructive pulmonary disease, unspecified: Secondary | ICD-10-CM | POA: Diagnosis not present

## 2023-02-04 DIAGNOSIS — Z7901 Long term (current) use of anticoagulants: Secondary | ICD-10-CM | POA: Diagnosis not present

## 2023-02-04 DIAGNOSIS — R531 Weakness: Secondary | ICD-10-CM | POA: Diagnosis not present

## 2023-02-04 DIAGNOSIS — W01198A Fall on same level from slipping, tripping and stumbling with subsequent striking against other object, initial encounter: Secondary | ICD-10-CM | POA: Insufficient documentation

## 2023-02-04 DIAGNOSIS — S0181XA Laceration without foreign body of other part of head, initial encounter: Secondary | ICD-10-CM | POA: Diagnosis not present

## 2023-02-04 DIAGNOSIS — Z85828 Personal history of other malignant neoplasm of skin: Secondary | ICD-10-CM | POA: Diagnosis not present

## 2023-02-04 DIAGNOSIS — S0101XA Laceration without foreign body of scalp, initial encounter: Secondary | ICD-10-CM | POA: Insufficient documentation

## 2023-02-04 DIAGNOSIS — I491 Atrial premature depolarization: Secondary | ICD-10-CM | POA: Diagnosis not present

## 2023-02-04 DIAGNOSIS — Z23 Encounter for immunization: Secondary | ICD-10-CM | POA: Insufficient documentation

## 2023-02-04 DIAGNOSIS — Z743 Need for continuous supervision: Secondary | ICD-10-CM | POA: Diagnosis not present

## 2023-02-04 LAB — COMPREHENSIVE METABOLIC PANEL
ALT: 21 U/L (ref 0–44)
AST: 27 U/L (ref 15–41)
Albumin: 4.2 g/dL (ref 3.5–5.0)
Alkaline Phosphatase: 72 U/L (ref 38–126)
Anion gap: 9 (ref 5–15)
BUN: 22 mg/dL (ref 8–23)
CO2: 22 mmol/L (ref 22–32)
Calcium: 9.2 mg/dL (ref 8.9–10.3)
Chloride: 108 mmol/L (ref 98–111)
Creatinine, Ser: 1.11 mg/dL (ref 0.61–1.24)
GFR, Estimated: >60 mL/min (ref >60)
Glucose, Bld: 103 mg/dL — ABNORMAL HIGH (ref 70–99)
Potassium: 3.5 mmol/L (ref 3.5–5.1)
Sodium: 139 mmol/L (ref 135–145)
Total Bilirubin: 1.3 mg/dL — ABNORMAL HIGH (ref 0.3–1.2)
Total Protein: 6.8 g/dL (ref 6.5–8.1)

## 2023-02-04 LAB — CBC WITH DIFFERENTIAL/PLATELET
Abs Immature Granulocytes: 0.04 10*3/uL (ref 0.00–0.07)
Basophils Absolute: 0 10*3/uL (ref 0.0–0.1)
Basophils Relative: 1 %
Eosinophils Absolute: 0.1 10*3/uL (ref 0.0–0.5)
Eosinophils Relative: 1 %
HCT: 40.1 % (ref 39.0–52.0)
Hemoglobin: 13.7 g/dL (ref 13.0–17.0)
Immature Granulocytes: 1 %
Lymphocytes Relative: 16 %
Lymphs Abs: 1 10*3/uL (ref 0.7–4.0)
MCH: 32 pg (ref 26.0–34.0)
MCHC: 34.2 g/dL (ref 30.0–36.0)
MCV: 93.7 fL (ref 80.0–100.0)
Monocytes Absolute: 0.8 10*3/uL (ref 0.1–1.0)
Monocytes Relative: 13 %
Neutro Abs: 4.1 10*3/uL (ref 1.7–7.7)
Neutrophils Relative %: 68 %
Platelets: 144 10*3/uL — ABNORMAL LOW (ref 150–400)
RBC: 4.28 MIL/uL (ref 4.22–5.81)
RDW: 13 % (ref 11.5–15.5)
WBC: 6 10*3/uL (ref 4.0–10.5)
nRBC: 0 % (ref 0.0–0.2)

## 2023-02-04 LAB — APTT: aPTT: 30 seconds (ref 24–36)

## 2023-02-04 LAB — PROTIME-INR
INR: 1.2 (ref 0.8–1.2)
Prothrombin Time: 14.9 seconds (ref 11.4–15.2)

## 2023-02-04 MED ORDER — LIDOCAINE-EPINEPHRINE 2 %-1:100000 IJ SOLN
20.0000 mL | Freq: Once | INTRAMUSCULAR | Status: AC
  Administered 2023-02-04: 20 mL
  Filled 2023-02-04: qty 1

## 2023-02-04 MED ORDER — GADOBUTROL 1 MMOL/ML IV SOLN
8.0000 mL | Freq: Once | INTRAVENOUS | Status: AC | PRN
  Administered 2023-02-04: 8 mL via INTRAVENOUS

## 2023-02-04 MED ORDER — TETANUS-DIPHTH-ACELL PERTUSSIS 5-2.5-18.5 LF-MCG/0.5 IM SUSY
0.5000 mL | PREFILLED_SYRINGE | Freq: Once | INTRAMUSCULAR | Status: AC
  Administered 2023-02-04: 0.5 mL via INTRAMUSCULAR
  Filled 2023-02-04: qty 0.5

## 2023-02-04 NOTE — ED Triage Notes (Signed)
Pt presents to the ED via [facility]. Pt has had two falls today. The second fall he hit his head on the wall. Small laceration noted to posterior scalp. No blood thinners. No LOC. Pt complains of weakness in bilateral legs. Pt states that this weakness has been going on for three months and he has been to his PCP as well as neurologist for this. Pt takes a beta blocker and states that his HR is usually low.   CBG 107  BP 160/84 98% on RA

## 2023-02-04 NOTE — ED Provider Notes (Signed)
[facility] Provider Note    Event Date/Time   First MD Initiated Contact with Patient 02/04/23 2110     (approximate)   History   Fall   HPI  James [name] is a 87 y.o. male past medical history of hypertension, COPD, diastolic dysfunction who presents after a fall.  Patient tells me that this evening he lost his balance and fell backward.  Did hit his head but did not lose consciousness.  He denies headache neck pain denies new focal numbness tingling or weakness.  Patient tells me that over the last year he has had progressive difficulty with his gait.  Has seen his primary doctor about this and has seen neurology as well.  He says that he feels like his feet are a foot off the ground and has difficulty with balance.  He has also had increasing depression.  He notes that all of this seemed to start when he had kidney stones removed about a year ago and did not go to the gym afterward feels like he has become deconditioned since that time and is not very motivated to start exercise again. \ She denies recent illness denies fevers chills nausea vomiting abdominal pain chest pain or dyspnea.  Denies lightheadedness dizziness/syncope/presyncope.  Denies vision change numbness tingling weakness.     Past Medical History:  Diagnosis Date   Anxiety    Aortic atherosclerosis (HCC)    Aortic dilatation (HCC) 01/10/2021   a.) CT 01/10/2021: infrarenal aortic dilitation measuring 3.3 x 3.3 cm. b.) CT 06/23/2021: measured 3.4 cm.   Arthritis    B12 deficiency    Basal cell carcinoma 03/14/2019   R nasal tip    Bilateral renal cysts    BPH (benign prostatic hyperplasia)    Bradycardia    Chronic kidney disease    COPD (chronic obstructive pulmonary disease) (HCC)    Coronary artery disease    a.) s/p 2v CABG in 2001; performed while living in [facility] [place].   Diastolic dysfunction 06/2020   a.) TTE 06/2020: EF 60%; sev LA dilation, mild RA dilation;  triv PR, mild to mod TR/MR/AR; G1DD.   Frequency of urination    GERD (gastroesophageal reflux disease)    Hiatal hernia    History of kidney stones    HLD (hyperlipidemia)    Hypertension    Internal hemorrhoids    Long term current use of anticoagulant    a.) Rivaroxaban   Melanoma (HCC) 11/11/2022   right infra orbital - MMIS discuss tx options   Mobitz type 1 second degree AV block    OSA (obstructive sleep apnea)    a.) does not require nocturnal PAP therapy   Osteopenia    Pulmonary HTN (HCC) 06/2020   a.) mild   S/P CABG x 2 2001   a.) details unknown; performed in 2001 in [place].    Patient Active Problem List   Diagnosis Date Noted   Unsteady gait when walking 11/09/2022   BPH with obstruction/lower urinary tract symptoms 03/21/2020   Nephrolithiasis 03/21/2020   Bradycardia, sinus 03/16/2019   Dizziness 03/16/2019   Essential hypertension 03/16/2019   Renal cyst 06/24/2018   Hiatal hernia 06/13/2018   Coronary atherosclerosis of autologous vein bypass graft 06/22/2017   Vitamin B12 deficiency 12/30/2016   Vitamin D deficiency 12/28/2016   Family history of malignant neoplasm of other organs or systems 12/08/2016   Obstructive sleep apnea (adult) (pediatric) 01/01/2016   Overweight 12/16/2015   Diverticulosis  of colon 11/01/2015   Internal hemorrhoid 11/01/2015   Senile ectropion of left lower eyelid 06/28/2014   Cherry angioma 02/22/2014   Lentigo 02/22/2014   Actinic keratosis 02/22/2014   Not current smoker 05/23/2013   Personal history of other malignant neoplasm of skin 05/23/2013   Hardening of the aorta (main artery of the heart) (HCC) 07/17/2012   Anxiety 06/15/2012   COPD (chronic obstructive pulmonary disease) (HCC) 06/15/2012   Neck mass 10/17/2011   Anemia 08/06/2011   Closed fracture of part of upper end of humerus 08/03/2011   Hx of CABG 01/22/2011   Hyperlipidemia 01/21/2011   Osteopenia 05/01/2010   Dyspepsia 03/08/2010   Colon  polyp 05/09/2009   Tobacco abuse counseling 11/11/2007     Physical Exam  Triage Vital Signs: ED Triage Vitals  Enc Vitals Group     BP 02/04/23 2102 (!) 144/88     Pulse Rate 02/04/23 2102 (!) 43     Resp 02/04/23 2102 18     Temp 02/04/23 2102 98.8 F (37.1 C)     Temp Source 02/04/23 2102 Oral     SpO2 02/04/23 2102 93 %     Weight 02/04/23 2100 181 lb (82.1 kg)     Height 02/04/23 2100 5\' 9"  (1.753 m)     Head Circumference --      Peak Flow --      Pain Score 02/04/23 2059 3     Pain Loc --      Pain Edu? --      Excl. in GC? --     Most recent vital signs: Vitals:   02/04/23 2200 02/04/23 2300  BP: (!) 157/73 (!) 179/90  Pulse: (!) 38 (!) 37  Resp: (!) 22 (!) 21  Temp:    SpO2: 92% 93%     General: Awake, no distress.  CV:  Good peripheral perfusion.  Resp:  Normal effort.  Abd:  No distention.  Neuro:             Awake, Alert, Oriented x 3  Other:  Approximately 5 cm horizontal laceration in the posterior occiput that is hemostatic No midline C, T or L-spine tenderness 5 out of 5 strength in bilateral upper and lower extremities Patient grossly intact in bilateral upper and lower extremities EOMI, face symmetric    ED Results / Procedures / Treatments  Labs (all labs ordered are listed, but only abnormal results are displayed) Labs Reviewed  CBC WITH DIFFERENTIAL/PLATELET - Abnormal; Notable for the following components:      Result Value   Platelets 144 (*)    All other components within normal limits  COMPREHENSIVE METABOLIC PANEL - Abnormal; Notable for the following components:   Glucose, Bld 103 (*)    Total Bilirubin 1.3 (*)    All other components within normal limits  PROTIME-INR  APTT     EKG EKG reviewed interpreted myself shows sinus rhythm with pattern of supraventricular bigeminy normal axis, nonspecific intraventricular conduction delay no acute ischemic changes    RADIOLOGY I reviewed interpreted the CT of the head which  shows possible hemorrhage in the posterior right thalamus   PROCEDURES:  Critical Care performed: Yes, see critical care procedure note(s)  .Marland KitchenLaceration Repair  Date/Time: 02/04/2023 11:38 PM  Performed by: Georga Hacking, MD Authorized by: Georga Hacking, MD   Consent:    Consent obtained:  Verbal   Risks discussed:  Infection, pain and poor cosmetic result Universal protocol:  Patient identity confirmed:  Verbally with patient Anesthesia:    Anesthesia method:  Local infiltration   Local anesthetic:  Lidocaine 2% WITH epi Laceration details:    Location:  Scalp   Scalp location:  Occipital   Length (cm):  5 Pre-procedure details:    Preparation:  Patient was prepped and draped in usual sterile fashion Exploration:    Limited defect created (wound extended): no     Imaging outcome: foreign body not noted     Wound extent: no foreign body and no underlying fracture     Contaminated: no   Treatment:    Area cleansed with:  Saline   Amount of cleaning:  Standard   Irrigation solution:  Sterile saline   Irrigation method:  Syringe   Debridement:  None Skin repair:    Repair method:  Staples   Number of staples:  4 Approximation:    Approximation:  Close Repair type:    Repair type:  Simple Post-procedure details:    Dressing:  Open (no dressing)   Procedure completion:  Tolerated .Critical Care  Performed by: Georga Hacking, MD Authorized by: Georga Hacking, MD   Critical care provider statement:    Critical care time (minutes):  30   Critical care was time spent personally by me on the following activities:  Development of treatment plan with patient or surrogate, discussions with consultants, evaluation of patient's response to treatment, examination of patient, ordering and review of laboratory studies, ordering and review of radiographic studies, ordering and performing treatments and interventions, pulse oximetry, re-evaluation of patient's  condition and review of old charts   The patient is on the cardiac monitor to evaluate for evidence of arrhythmia and/or significant heart rate changes.   MEDICATIONS ORDERED IN ED: Medications  gadobutrol (GADAVIST) 1 MMOL/ML injection 8 mL (has no administration in time range)  Tdap (BOOSTRIX) injection 0.5 mL (0.5 mLs Intramuscular Given 02/04/23 2147)  lidocaine-EPINEPHrine (XYLOCAINE W/EPI) 2 %-1:100000 (with pres) injection 20 mL (20 mLs Infiltration Given 02/04/23 2219)     IMPRESSION / MDM / ASSESSMENT AND PLAN / ED COURSE  I reviewed the triage vital signs and the nursing notes.                              Patient's presentation is most consistent with acute presentation with potential threat to life or bodily function.  Differential diagnosis includes, but is not limited to, skull fracture, intracranial hemorrhage, cervical spine fracture  Patient is a 87 year old male who presents after a fall today.  Patient is had a year of progressive gait difficulty and tonight had a fall due to losing balance.  Did not lose consciousness but did hit his head.  He has a 5 cm horizontally positioned laceration in the posterior occiput.  On neurologic exam he is nonfocal with good strength in bilateral upper and lower extremities no other signs of trauma no C, T or L-spine tenderness.  Patient's EKG shows heart rate of around 80 with frequent supraventricular ectopy in a pattern of bigeminy.  Heart rate on the monitor from EKG is reading in the 40s but his heart rate on EKG is in the 80s.  When I palpate his pulse some of these ectopic beats are not generating an actual palpable pulse but patient is not presyncopal not lightheaded so I think he is asymptomatic from the standpoint.  CT head demonstrates possible hemorrhage in the right  posterior thalamus which is atypical for traumatic hemorrhage.  Discussed with neurosurgery who recommends getting an MRI with and without as well as repeat CT  head at 6 hours and if this is stable can likely be discharged.  Discussed plan with the patient.  Did repair his laceration with staples tetanus was updated.  Signed out to oncoming provider pending results of imaging.      FINAL CLINICAL IMPRESSION(S) / ED DIAGNOSES   Final diagnoses:  Laceration of scalp, initial encounter  Fall, initial encounter     Rx / DC Orders   ED Discharge Orders     None        Note:  This document was prepared using Dragon voice recognition software and may include unintentional dictation errors.   Georga Hacking, MD 02/04/23 [phone]

## 2023-02-04 NOTE — Telephone Encounter (Signed)
Spoke with patient regarding Imiquimod RX, how long should he be using? Patient has been using Monday - Friday but would like to know the time length on medication.

## 2023-02-05 ENCOUNTER — Emergency Department: Payer: Medicare HMO

## 2023-02-05 DIAGNOSIS — R531 Weakness: Secondary | ICD-10-CM | POA: Diagnosis not present

## 2023-02-05 NOTE — ED Provider Notes (Signed)
87 yo M here with fall and small area of thalamic hemorrhage. Likely traumatic though somewhat atypical in location so MRI obtained, which was reviewed and shows hemorrhage but no infarction or signs to suggest hemorrhagic stroke. Case discussed with Dr. Myer Haff of [facility]. CT repeated at 6 hr with no change in small area of hemorrhage. Plan to d/c home with outpt follow-up. Pt is not on anticoagulation and is at mental baseline. He is in agreement with this plan.   Shaune Pollack, MD 02/05/23 [phone]

## 2023-02-05 NOTE — Discharge Instructions (Addendum)
Do not take any ASPIRIN or blood thinners until cleared by Neurosurgery  Follow-up with primary care doctor in 10 days for staple removal  It is OK to shower, but do not swim or submerge your head/laceration under water  Take tylenol for headache as needed  Follow-up with Neurosurgery Dr. Myer Haff

## 2023-02-08 ENCOUNTER — Telehealth: Payer: Self-pay | Admitting: Neurosurgery

## 2023-02-08 ENCOUNTER — Ambulatory Visit (INDEPENDENT_AMBULATORY_CARE_PROVIDER_SITE_OTHER): Payer: Medicare HMO | Admitting: Internal Medicine

## 2023-02-08 ENCOUNTER — Encounter: Payer: Self-pay | Admitting: Internal Medicine

## 2023-02-08 VITALS — BP 170/80 | HR 67 | Ht 69.0 in | Wt 186.6 lb

## 2023-02-08 DIAGNOSIS — F411 Generalized anxiety disorder: Secondary | ICD-10-CM

## 2023-02-08 DIAGNOSIS — R42 Dizziness and giddiness: Secondary | ICD-10-CM

## 2023-02-08 DIAGNOSIS — R001 Bradycardia, unspecified: Secondary | ICD-10-CM

## 2023-02-08 DIAGNOSIS — S06300D Unspecified focal traumatic brain injury without loss of consciousness, subsequent encounter: Secondary | ICD-10-CM

## 2023-02-08 DIAGNOSIS — I10 Essential (primary) hypertension: Secondary | ICD-10-CM | POA: Diagnosis not present

## 2023-02-08 MED ORDER — CITALOPRAM HYDROBROMIDE 20 MG PO TABS: 20.0000 mg | ORAL_TABLET | Freq: Every day | ORAL | 3 refills | Status: DC

## 2023-02-08 NOTE — Progress Notes (Signed)
Established Patient Office Visit  Subjective:  Patient ID: James [name], male    DOB: 10-30-1935  Age: 87 y.o. MRN: [id number]  No chief complaint on file.   Patient recently went to the emergency room with a history of fall.  Patient reports that he felt unsteady on his feet and fell backwards.  He hit his head but did not lose consciousness.  In the emergency room the CT/MRI showed small thalamic bleed but it did not get worse on repeat CT.  Patient has been referred to neurosurgery.  He suffered from a laceration of his scalp with heavy bleeding but it was stapled together and the area looks clean and healing nicely. Today patient sits comfortably and has no new complaints.  He says he feels tired at times and admits to feeling depressed but he has stopped his citalopram few months ago.  Today he agrees to resume citalopram at 20 mg/day.  He continues to use his walking cane but refuses to use a walker. Will also resume his physical therapy now for muscle strengthening. His blood pressure is high but it is normal at his home, needs to be monitored before we add more medications.    No other concerns at this time.   Past Medical History:  Diagnosis Date   Anxiety    Aortic atherosclerosis (HCC)    Aortic dilatation (HCC) 01/10/2021   a.) CT 01/10/2021: infrarenal aortic dilitation measuring 3.3 x 3.3 cm. b.) CT 06/23/2021: measured 3.4 cm.   Arthritis    B12 deficiency    Basal cell carcinoma 03/14/2019   R nasal tip    Bilateral renal cysts    BPH (benign prostatic hyperplasia)    Bradycardia    Chronic kidney disease    COPD (chronic obstructive pulmonary disease) (HCC)    Coronary artery disease    a.) s/p 2v CABG in 2001; performed while living in [facility] [place].   Diastolic dysfunction 06/2020   a.) TTE 06/2020: EF 60%; sev LA dilation, mild RA dilation; triv PR, mild to mod TR/MR/AR; G1DD.   Frequency of urination    GERD (gastroesophageal reflux disease)     Hiatal hernia    History of kidney stones    HLD (hyperlipidemia)    Hypertension    Internal hemorrhoids    Long term current use of anticoagulant    a.) Rivaroxaban   Melanoma (HCC) 11/11/2022   right infra orbital - MMIS discuss tx options   Mobitz type 1 second degree AV block    OSA (obstructive sleep apnea)    a.) does not require nocturnal PAP therapy   Osteopenia    Pulmonary HTN (HCC) 06/2020   a.) mild   S/P CABG x 2 2001   a.) details unknown; performed in 2001 in [place].    Past Surgical History:  Procedure Laterality Date   CARDIAC CATHETERIZATION     COLONOSCOPY     CORONARY ARTERY BYPASS GRAFT     CYSTOSCOPY/URETEROSCOPY/HOLMIUM LASER/STENT PLACEMENT Right 11/07/2021   Procedure: CYSTOSCOPY/URETEROSCOPY/HOLMIUM LASER/STENT PLACEMENT;  Surgeon: Sondra Come, MD;  Location: [facility] ORS;  Service: Urology;  Laterality: Right;   FRACTURE SURGERY Left    fractured left arm   HOLEP-LASER ENUCLEATION OF THE PROSTATE WITH MORCELLATION N/A 11/07/2021   Procedure: HOLEP-LASER ENUCLEATION OF THE PROSTATE WITH MORCELLATION;  Surgeon: Sondra Come, MD;  Location: [facility] ORS;  Service: Urology;  Laterality: N/A;   LIPOMA EXCISION N/A 11/21/2018   Procedure: EXCISION BACK LIPOMA;  Surgeon: Griselda Miner, MD;  Location: [facility] OR;  Service: General;  Laterality: N/A;   PILONIDAL CYST EXCISION     SKIN CANCER EXCISION     TOTAL SHOULDER REPLACEMENT Left    UMBILICAL HERNIA REPAIR N/A 11/21/2018   Procedure: HERNIA REPAIR UMBILICAL ADULT WITH MESH;  Surgeon: Griselda Miner, MD;  Location: [facility] OR;  Service: General;  Laterality: N/A;    Social History   Socioeconomic History   Marital status: Widowed    Spouse name: Not on file   Number of children: Not on file   Years of education: Not on file   Highest education level: Not on file  Occupational History   Not on file  Tobacco Use   Smoking status: Former    Packs/day: 1.00    Years: 20.00    Additional pack years:  0.00    Total pack years: 20.00    Types: Cigarettes    Quit date: 09/21/2013    Years since quitting: 9.3    Passive exposure: Past   Smokeless tobacco: Never   Tobacco comments:    quit on and off over the years  Vaping Use   Vaping Use: Never used  Substance and Sexual Activity   Alcohol use: Yes    Comment: socially   Drug use: No   Sexual activity: Yes    Birth control/protection: None  Other Topics Concern   Not on file  Social History Narrative   Not on file   Social Determinants of Health   Financial Resource Strain: Not on file  Food Insecurity: Not on file  Transportation Needs: Not on file  Physical Activity: Not on file  Stress: Not on file  Social Connections: Not on file  Intimate Partner Violence: Not on file    Family History  Problem Relation Age of Onset   Prostate cancer Neg Hx    Bladder Cancer Neg Hx    Kidney cancer Neg Hx     No Known Allergies  Review of Systems  Constitutional:  Negative for chills, diaphoresis, fever, malaise/fatigue and weight loss.  HENT:  Positive for hearing loss and sinus pain. Negative for ear discharge, sore throat and tinnitus.   Eyes: Negative.   Respiratory:  Negative for cough, shortness of breath and stridor.   Cardiovascular:  Negative for chest pain, palpitations, orthopnea and leg swelling.  Gastrointestinal:  Negative for abdominal pain, blood in stool, heartburn, melena and nausea.  Genitourinary: Negative.   Musculoskeletal:  Positive for falls. Negative for back pain, joint pain, myalgias and neck pain.  Neurological:  Positive for dizziness. Negative for tingling, focal weakness, seizures, weakness and headaches.  Psychiatric/Behavioral:  Positive for depression. Negative for hallucinations, substance abuse and suicidal ideas. The patient is nervous/anxious. The patient does not have insomnia.        Objective:   BP (!) 170/80   Pulse 67   Ht 5\' 9"  (1.753 m)   Wt 186 lb 9.6 oz (84.6 kg)   SpO2  95%   BMI 27.56 kg/m   Vitals:   02/08/23 0939  BP: (!) 170/80  Pulse: 67  Height: 5\' 9"  (1.753 m)  Weight: 186 lb 9.6 oz (84.6 kg)  SpO2: 95%  BMI (Calculated): 27.54    Physical Exam Vitals and nursing note reviewed.  Constitutional:      Appearance: Normal appearance.  Cardiovascular:     Rate and Rhythm: Normal rate and regular rhythm.     Pulses: Normal pulses.  Heart sounds: Normal heart sounds.  Pulmonary:     Effort: Pulmonary effort is normal.     Breath sounds: Normal breath sounds.  Abdominal:     General: Bowel sounds are normal.     Palpations: Abdomen is soft.  Musculoskeletal:        General: No swelling or tenderness. Normal range of motion.     Cervical back: Normal range of motion and neck supple. No rigidity or tenderness.     Right lower leg: No edema.     Left lower leg: No edema.  Skin:    General: Skin is warm and dry.  Neurological:     General: No focal deficit present.     Mental Status: He is alert and oriented to person, place, and time.     Cranial Nerves: No cranial nerve deficit.     Sensory: No sensory deficit.     Motor: No weakness.  Psychiatric:        Mood and Affect: Mood normal.        Behavior: Behavior normal.      No results found for any visits on 02/08/23.  Recent Results (from the past 2160 hour(s))  CBC With Differential     Status: None   Collection Time: 11/11/22 11:15 AM  Result Value Ref Range   WBC 5.0 3.4 - 10.8 x10E3/uL   RBC 4.42 4.14 - 5.80 x10E6/uL   Hemoglobin 14.2 13.0 - 17.7 g/dL   Hematocrit 40.9 81.1 - 51.0 %   MCV 95 79 - 97 fL   MCH 32.1 26.6 - 33.0 pg   MCHC 33.9 31.5 - 35.7 g/dL   RDW 91.4 78.2 - 95.6 %   Neutrophils 63 Not Estab. %   Lymphs 25 Not Estab. %   Monocytes 9 Not Estab. %   Eos 2 Not Estab. %   Basos 1 Not Estab. %   Neutrophils Absolute 3.2 1.4 - 7.0 x10E3/uL   Lymphocytes Absolute 1.2 0.7 - 3.1 x10E3/uL   Monocytes Absolute 0.4 0.1 - 0.9 x10E3/uL   EOS (ABSOLUTE) 0.1  0.0 - 0.4 x10E3/uL   Basophils Absolute 0.1 0.0 - 0.2 x10E3/uL   Immature Granulocytes 0 Not Estab. %   Immature Grans (Abs) 0.0 0.0 - 0.1 x10E3/uL  CMP14+EGFR     Status: None   Collection Time: 11/11/22 11:15 AM  Result Value Ref Range   Glucose 92 70 - 99 mg/dL   BUN 19 8 - 27 mg/dL   Creatinine, Ser 2.13 0.76 - 1.27 mg/dL   eGFR 85 >08 MV/HQI/6.96   BUN/Creatinine Ratio 22 10 - 24   Sodium 142 134 - 144 mmol/L   Potassium 4.1 3.5 - 5.2 mmol/L   Chloride 105 96 - 106 mmol/L   CO2 20 20 - 29 mmol/L   Calcium 9.7 8.6 - 10.2 mg/dL   Total Protein 6.7 6.0 - 8.5 g/dL   Albumin 4.4 3.7 - 4.7 g/dL   Globulin, Total 2.3 1.5 - 4.5 g/dL   Albumin/Globulin Ratio 1.9 1.2 - 2.2   Bilirubin Total 0.7 0.0 - 1.2 mg/dL   Alkaline Phosphatase 81 44 - 121 IU/L   AST 26 0 - 40 IU/L   ALT 20 0 - 44 IU/L  Lipid panel     Status: None   Collection Time: 11/11/22 11:15 AM  Result Value Ref Range   Cholesterol, Total 127 100 - 199 mg/dL   Triglycerides 52 0 - 149 mg/dL   HDL 68 >29  mg/dL   VLDL Cholesterol Cal 12 5 - 40 mg/dL   LDL Chol Calc (NIH) 47 0 - 99 mg/dL   Chol/HDL Ratio 1.9 0.0 - 5.0 ratio    Comment:                                   T. Chol/HDL Ratio                                             Men  Women                               1/2 Avg.Risk  3.4    3.3                                   Avg.Risk  5.0    4.4                                2X Avg.Risk  9.6    7.1                                3X Avg.Risk 23.4   11.0   CBC with Differential     Status: Abnormal   Collection Time: 02/04/23  9:14 PM  Result Value Ref Range   WBC 6.0 4.0 - 10.5 K/uL   RBC 4.28 4.22 - 5.81 MIL/uL   Hemoglobin 13.7 13.0 - 17.0 g/dL   HCT 16.1 09.6 - 04.5 %   MCV 93.7 80.0 - 100.0 fL   MCH 32.0 26.0 - 34.0 pg   MCHC 34.2 30.0 - 36.0 g/dL   RDW 40.9 81.1 - 91.4 %   Platelets 144 (L) 150 - 400 K/uL   nRBC 0.0 0.0 - 0.2 %   Neutrophils Relative % 68 %   Neutro Abs 4.1 1.7 - 7.7 K/uL    Lymphocytes Relative 16 %   Lymphs Abs 1.0 0.7 - 4.0 K/uL   Monocytes Relative 13 %   Monocytes Absolute 0.8 0.1 - 1.0 K/uL   Eosinophils Relative 1 %   Eosinophils Absolute 0.1 0.0 - 0.5 K/uL   Basophils Relative 1 %   Basophils Absolute 0.0 0.0 - 0.1 K/uL   Immature Granulocytes 1 %   Abs Immature Granulocytes 0.04 0.00 - 0.07 K/uL    Comment: Performed at  [facility], [address] [place], [place] [address]  Comprehensive metabolic panel     Status: Abnormal   Collection Time: 02/04/23  9:14 PM  Result Value Ref Range   Sodium 139 135 - 145 mmol/L   Potassium 3.5 3.5 - 5.1 mmol/L   Chloride 108 98 - 111 mmol/L   CO2 22 22 - 32 mmol/L   Glucose, Bld 103 (H) 70 - 99 mg/dL    Comment: Glucose reference range applies only to samples taken after fasting for at least 8 hours.   BUN 22 8 - 23 mg/dL   Creatinine, Ser 6.21 0.61 - 1.24 mg/dL   Calcium 9.2 8.9 - 30.8 mg/dL   Total  Protein 6.8 6.5 - 8.1 g/dL   Albumin 4.2 3.5 - 5.0 g/dL   AST 27 15 - 41 U/L   ALT 21 0 - 44 U/L   Alkaline Phosphatase 72 38 - 126 U/L   Total Bilirubin 1.3 (H) 0.3 - 1.2 mg/dL   GFR, Estimated >96 >04 mL/min    Comment: (NOTE) Calculated using the CKD-EPI Creatinine Equation (2021)    Anion gap 9 5 - 15    Comment: Performed at [facility], [address] [place], [place] [address]  Protime-INR     Status: None   Collection Time: 02/04/23  9:14 PM  Result Value Ref Range   Prothrombin Time 14.9 11.4 - 15.2 seconds   INR 1.2 0.8 - 1.2    Comment: (NOTE) INR goal varies based on device and disease states. Performed at [facility], [address] [place], [place] [address]   APTT     Status: None   Collection Time: 02/04/23  9:14 PM  Result Value Ref Range   aPTT 30 24 - 36 seconds    Comment: Performed at [facility], [address] [place], [place] [address]      Assessment & Plan:  Monitor blood pressure very closely.  Patient will resume  physical therapy for muscle strengthening.  Refill sent for citalopram 20 mg a day. Problem List Items Addressed This Visit     Bradycardia, sinus   Dizziness   Essential hypertension, benign - Primary   GAD (generalized anxiety disorder)   Relevant Medications   citalopram (CELEXA) 20 MG tablet    Return in about 2 weeks (around 02/22/2023).   Total time spent: 30 minutes  Margaretann Loveless, MD  02/08/2023   This document may have been prepared by [facility] Voice Recognition software and as such may include unintentional dictation errors.

## 2023-02-08 NOTE — Telephone Encounter (Signed)
Order placed for head CT to be done around 02/25/23 (3 weeks from date of injury). Per discussion with Dr Myer Haff, OK to have a 15 minute telephone call with him after the CT scan.   Needs to see his PCP in about a week for the staple removal. If they can't get him in, he can come here for a nurse visit.

## 2023-02-08 NOTE — Telephone Encounter (Signed)
02/04/2023 ER laceration and hemorrhage Will he need a CT scan or just a f/u appt? The staples in his head, can he have them removed here or his PCP?

## 2023-02-08 NOTE — Telephone Encounter (Signed)
Patient notified of Dr.Yarbrough plan, he agreed.

## 2023-02-08 NOTE — Telephone Encounter (Signed)
Left message for patient to return my call. aw 

## 2023-02-10 NOTE — Telephone Encounter (Signed)
Patient advised of information per Dr. Kowalski. aw 

## 2023-02-11 ENCOUNTER — Telehealth: Payer: Self-pay

## 2023-02-11 NOTE — Telephone Encounter (Signed)
Transition Care Management Follow-up Telephone Call Date of discharge and from where: 02/05/2023 [facility] How have you been since you were released from the hospital? Patient stated he is feeling better. Any questions or concerns? No  Items Reviewed: Did the pt receive and understand the discharge instructions provided? Yes  Medications obtained and verified? Yes  Other? No  Any new allergies since your discharge? Yes  Dietary orders reviewed? Yes Do you have support at home? Yes   Follow up appointments reviewed:  PCP Hospital f/u appt confirmed? Yes  Scheduled to see Yves Dill MD on 02/22/2023 @ [facility]. Specialist Hospital f/u appt confirmed? No  Scheduled to see  on  @ . Are transportation arrangements needed? No  If their condition worsens, is the pt aware to call PCP or go to the Emergency Dept.? Yes Was the patient provided with contact information for the PCP's office or ED? Yes Was to pt encouraged to call back with questions or concerns? Yes  Corlene Sabia [facility]  [facility] Population Health Community Resource Care Guide   [email or web address]  ?? [phone]   Website: [email or web address]  [email or web address]

## 2023-02-12 NOTE — Telephone Encounter (Signed)
He confirmed for 03/18/23 he will be out of town earlier that month.

## 2023-02-16 DIAGNOSIS — Z4802 Encounter for removal of sutures: Secondary | ICD-10-CM | POA: Diagnosis not present

## 2023-02-16 DIAGNOSIS — S0990XD Unspecified injury of head, subsequent encounter: Secondary | ICD-10-CM | POA: Diagnosis not present

## 2023-02-22 ENCOUNTER — Ambulatory Visit: Payer: Medicare HMO | Admitting: Internal Medicine

## 2023-02-25 ENCOUNTER — Ambulatory Visit: Payer: Medicare HMO

## 2023-03-02 ENCOUNTER — Ambulatory Visit
Admission: RE | Admit: 2023-03-02 | Discharge: 2023-03-02 | Disposition: A | Discharge status: Home / Self Care | Payer: Medicare HMO | Source: Ambulatory Visit | Attending: Neurosurgery | Admitting: Neurosurgery

## 2023-03-02 DIAGNOSIS — S06300D Unspecified focal traumatic brain injury without loss of consciousness, subsequent encounter: Secondary | ICD-10-CM | POA: Insufficient documentation

## 2023-03-02 DIAGNOSIS — I629 Nontraumatic intracranial hemorrhage, unspecified: Secondary | ICD-10-CM | POA: Diagnosis not present

## 2023-03-05 ENCOUNTER — Telehealth (INDEPENDENT_AMBULATORY_CARE_PROVIDER_SITE_OTHER): Payer: Medicare HMO | Admitting: Neurosurgery

## 2023-03-05 NOTE — Telephone Encounter (Signed)
I have reviewed his repeat CT scan with him.  He is having minimal symptoms.  He is back to his baseline.  The CT scan was read as having mineralization within the posterior thalamus.  To me, it appears to be a slowly resolving hematoma.  I do not think he needs any further follow-up studies.  I think this will resolve with time.

## 2023-03-15 ENCOUNTER — Telehealth: Payer: Medicare HMO

## 2023-03-18 ENCOUNTER — Ambulatory Visit: Payer: Medicare HMO | Admitting: Dermatology

## 2023-03-18 ENCOUNTER — Telehealth: Payer: Medicare HMO | Admitting: Neurosurgery

## 2023-03-18 VITALS — BP 165/78

## 2023-03-18 DIAGNOSIS — L578 Other skin changes due to chronic exposure to nonionizing radiation: Secondary | ICD-10-CM | POA: Diagnosis not present

## 2023-03-18 DIAGNOSIS — D030 Melanoma in situ of lip: Secondary | ICD-10-CM

## 2023-03-18 DIAGNOSIS — D0339 Melanoma in situ of other parts of face: Secondary | ICD-10-CM | POA: Diagnosis not present

## 2023-03-18 DIAGNOSIS — Z5111 Encounter for antineoplastic chemotherapy: Secondary | ICD-10-CM

## 2023-03-18 DIAGNOSIS — Z79899 Other long term (current) drug therapy: Secondary | ICD-10-CM | POA: Diagnosis not present

## 2023-03-18 DIAGNOSIS — Z7189 Other specified counseling: Secondary | ICD-10-CM

## 2023-03-18 DIAGNOSIS — D0330 Melanoma in situ of unspecified part of face: Secondary | ICD-10-CM

## 2023-03-18 MED ORDER — IMIQUIMOD 5 % EX CREA: TOPICAL_CREAM | CUTANEOUS | 1 refills | Status: DC

## 2023-03-18 NOTE — Patient Instructions (Addendum)
Restart Imiquimod 5% cream once daily 5 days a week to area on right cheek until you come back for follow up.   Due to recent changes in healthcare laws, you may see results of your pathology and/or laboratory studies on MyChart before the doctors have had a chance to review them. We understand that in some cases there may be results that are confusing or concerning to you. Please understand that not all results are received at the same time and often the doctors may need to interpret multiple results in order to provide you with the best plan of care or course of treatment. Therefore, we ask that you please give James [name] 2 business days to thoroughly review all your results before contacting the office for clarification. Should we see a critical lab result, you will be contacted sooner.   If You Need Anything After Your Visit  If you have any questions or concerns for your doctor, please call our main line at [phone] and press option 4 to reach your doctor's medical assistant. If no one answers, please leave a voicemail as directed and we will return your call as soon as possible. Messages left after 4 pm will be answered the following business day.   You may also send James [name] a message via MyChart. We typically respond to MyChart messages within 1-2 business days.  For prescription refills, please ask your pharmacy to contact our office. Our fax number is [phone].  If you have an urgent issue when the clinic is closed that cannot wait until the next business day, you can page your doctor at the number below.    Please note that while we do our best to be available for urgent issues outside of office hours, we are not available 24/7.   If you have an urgent issue and are unable to reach James [name], you may choose to seek medical care at your doctor's office, retail clinic, urgent care center, or emergency room.  If you have a medical emergency, please immediately call 911 or go to the emergency  department.  Pager Numbers  - Dr. Gwen Pounds: [phone]  - Dr. Neale Burly: [phone]  - Dr. Roseanne Reno: [phone]  In the event of inclement weather, please call our main line at [phone] for an update on the status of any delays or closures.  Dermatology Medication Tips: Please keep the boxes that topical medications come in in order to help keep track of the instructions about where and how to use these. Pharmacies typically print the medication instructions only on the boxes and not directly on the medication tubes.   If your medication is too expensive, please contact our office at [phone] option 4 or send James [name] a message through MyChart.   We are unable to tell what your co-pay for medications will be in advance as this is different depending on your insurance coverage. However, we may be able to find a substitute medication at lower cost or fill out paperwork to get insurance to cover a needed medication.   If a prior authorization is required to get your medication covered by your insurance company, please allow James [name] 1-2 business days to complete this process.  Drug prices often vary depending on where the prescription is filled and some pharmacies may offer cheaper prices.  The website www.goodrx.com contains coupons for medications through different pharmacies. The prices here do not account for what the cost may be with help from insurance (it may be cheaper with your insurance), but the website  can give you the price if you did not use any insurance.  - You can print the associated coupon and take it with your prescription to the pharmacy.  - You may also stop by our office during regular business hours and pick up a GoodRx coupon card.  - If you need your prescription sent electronically to a different pharmacy, notify our office through [facility] or by phone at [phone] option 4.     Si Usted Necesita Algo Despus de Su Visita  Tambin puede enviarnos un  mensaje a travs de Clinical cytogeneticist. Por lo general respondemos a los mensajes de MyChart en el transcurso de 1 a 2 das hbiles.  Para renovar recetas, por favor pida a su farmacia que se ponga en contacto con nuestra oficina. [name] de fax es [place] [phone].  Si tiene un asunto urgente cuando la clnica est cerrada y que no puede esperar hasta el siguiente da hbil, puede llamar/localizar a su doctor(a) al nmero que aparece a continuacin.   Por favor, tenga en cuenta que aunque hacemos todo lo posible para estar disponibles para asuntos urgentes fuera del horario de Ute, no estamos disponibles las 24 horas del da, los 7 [address]  [address] de la [place].   Si tiene un problema urgente y no puede comunicarse con nosotros, puede optar por buscar atencin mdica  en el consultorio de su doctor(a), en una clnica privada, en un centro de atencin urgente o en una sala de emergencias.  Si tiene Engineer, drilling, por favor llame inmediatamente al 911 o vaya a la sala de emergencias.  Nmeros de bper  - Dr. Gwen Pounds: [phone]  - Dra. Moye: [phone]  - Dra. Roseanne Reno: [phone]  En caso de inclemencias del [place], por favor llame a Lacy Duverney principal al [phone] para una actualizacin sobre el [place] de cualquier retraso o cierre.  Consejos para la medicacin en dermatologa: Por favor, guarde las cajas en las que vienen los medicamentos de uso tpico para ayudarle a seguir las instrucciones sobre dnde y cmo usarlos. Las farmacias generalmente imprimen las instrucciones del medicamento slo en las cajas y no directamente en los tubos del [place].   Si su medicamento es muy caro, por favor, pngase en contacto con Rolm Gala llamando al [phone] y presione la opcin 4 o envenos un mensaje a travs de Clinical cytogeneticist.   No podemos decirle cul ser su copago por los medicamentos por adelantado ya que esto es diferente dependiendo de la cobertura de su seguro. Sin embargo,  es posible que podamos encontrar un medicamento sustituto a Audiological scientist un formulario para que el seguro cubra el medicamento que se considera necesario.   Si se requiere una autorizacin previa para que su compaa de seguros [place] su medicamento, por favor permtanos de 1 a 2 das hbiles para completar [address]  Los precios de los medicamentos varan con frecuencia dependiendo del Environmental consultant de dnde se surte la receta y alguna farmacias pueden ofrecer precios ms baratos.  El sitio web www.goodrx.com tiene cupones para medicamentos de Health and safety inspector. Los precios aqu no tienen en cuenta lo que podra costar con la ayuda del seguro (puede ser ms barato con su seguro), pero el sitio web puede darle el precio si no utiliz Tourist information centre manager.  - Puede imprimir el cupn correspondiente y llevarlo con su receta a la farmacia.  - Tambin puede pasar por nuestra oficina durante el horario de atencin regular y Education officer, museum una tarjeta de cupones de GoodRx.  -  Si necesita que su receta se enve electrnicamente a una farmacia diferente, informe a nuestra oficina a travs de MyChart de [place] o por telfono llamando al [phone] y presione la opcin 4.  

## 2023-03-18 NOTE — Progress Notes (Signed)
   Follow-Up Visit   Subjective  James [name] is a 87 y.o. 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  The following portions of the chart were reviewed this encounter and updated as appropriate: medications, allergies, medical history  Review of Systems:  No other skin or systemic complaints except as noted in HPI or Assessment and Plan.  Objective  Well appearing patient in no apparent distress; mood and affect are within normal limits. A focused examination was performed of the following areas: face Relevant exam findings are noted in the Assessment and Plan.   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  He understands he may need further surgical procedure if the treatment does not work.  R infra orbital Exam: R infra orbital mostly clear with one small area vague spot 2mm in center  Treatment Plan: Restart Imiquimod 5% cr every day 5d/wk until f/u   Reviewed expected reaction when using imiquimod cream, including irritation and mild inflammation and risk of erosions or more severe inflammation. Reviewed not to apply this in an area larger than 4 x 4 inches to avoid flu-like symptoms. Only a thin layer is required. Reviewed if too much irritation occurs, ensure application of only a thin layer and decrease frequency slightly to achieve a  tolerable level of inflammation.    ACTINIC DAMAGE - chronic, secondary to cumulative UV radiation exposure/sun exposure over time - diffuse scaly erythematous macules with underlying dyspigmentation - Recommend daily broad spectrum sunscreen SPF 30+ to sun-exposed areas, reapply every 2 hours as needed.  - Recommend staying in the shade or wearing long sleeves, sun glasses (UVA+UVB protection) and wide brim hats (4-inch brim around the entire circumference of the hat). - Call for new or changing lesions.  Return for 3-4 wks melanoma IS f/u.  I, Ardis Rowan, RMA, am acting as scribe for Armida Sans, MD .  Documentation: I have reviewed the above documentation for accuracy and completeness, and I agree with the above.  Armida Sans, MD

## 2023-03-20 ENCOUNTER — Encounter: Payer: Self-pay | Admitting: Dermatology

## 2023-04-14 ENCOUNTER — Ambulatory Visit: Payer: Medicare HMO | Admitting: Dermatology

## 2023-04-14 DIAGNOSIS — L820 Inflamed seborrheic keratosis: Secondary | ICD-10-CM | POA: Diagnosis not present

## 2023-04-14 DIAGNOSIS — L570 Actinic keratosis: Secondary | ICD-10-CM

## 2023-04-14 DIAGNOSIS — Z79899 Other long term (current) drug therapy: Secondary | ICD-10-CM

## 2023-04-14 DIAGNOSIS — Z5111 Encounter for antineoplastic chemotherapy: Secondary | ICD-10-CM

## 2023-04-14 DIAGNOSIS — W908XXA Exposure to other nonionizing radiation, initial encounter: Secondary | ICD-10-CM

## 2023-04-14 DIAGNOSIS — L814 Other melanin hyperpigmentation: Secondary | ICD-10-CM

## 2023-04-14 DIAGNOSIS — D0339 Melanoma in situ of other parts of face: Secondary | ICD-10-CM | POA: Diagnosis not present

## 2023-04-14 DIAGNOSIS — L578 Other skin changes due to chronic exposure to nonionizing radiation: Secondary | ICD-10-CM | POA: Diagnosis not present

## 2023-04-14 DIAGNOSIS — D0330 Melanoma in situ of unspecified part of face: Secondary | ICD-10-CM

## 2023-04-14 DIAGNOSIS — L821 Other seborrheic keratosis: Secondary | ICD-10-CM

## 2023-04-14 DIAGNOSIS — Z7189 Other specified counseling: Secondary | ICD-10-CM

## 2023-04-14 NOTE — Patient Instructions (Signed)

## 2023-04-14 NOTE — Progress Notes (Signed)
Follow-Up Visit   Subjective  James [name] is a 87 y.o. 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 Patient has been using Imiquimod QD since his last visit and lesion is crusted and irritated. The patient has spots, moles and lesions to be evaluated, some may be new or changing.  The following portions of the chart were reviewed this encounter and updated as appropriate: medications, allergies, medical history  Review of Systems:  No other skin or systemic complaints except as noted in HPI or Assessment and Plan.  Objective  Well appearing patient in no apparent distress; mood and affect are within normal limits. A focused examination was performed of the following areas: the face Relevant exam findings are noted in the Assessment and Plan.  Ant scalp x 1 Erythematous stuck-on, waxy papule or plaque  Scalp x 1, L ear x 2 (15) Erythematous thin papules/macules with gritty scale.    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  He understands he may need further surgical procedure if this treatment does not work.   R infra orbital Exam: R infra orbital crust  see photo Treatment Plan: D/C Imiquimod. Will reassess at next visit in two - three months.   ACTINIC DAMAGE - chronic, secondary to cumulative UV radiation exposure/sun exposure over time - diffuse scaly erythematous macules with underlying dyspigmentation - Recommend daily broad spectrum sunscreen SPF 30+ to sun-exposed areas, reapply every 2 hours as needed.  - Recommend staying  in the shade or wearing long sleeves, sun glasses (UVA+UVB protection) and wide brim hats (4-inch brim around the entire circumference of the hat). - Call for new or changing lesions.  Inflamed seborrheic keratosis Ant scalp x 1  Destruction of lesion - Ant scalp x 1 Complexity: simple   Destruction method: cryotherapy   Informed consent: discussed and consent obtained   Timeout:  patient name, date of birth, surgical site, and procedure verified Lesion destroyed using liquid nitrogen: Yes   Region frozen until ice ball extended beyond lesion: Yes   Outcome: patient tolerated procedure well with no complications   Post-procedure details: wound care instructions given    AK (actinic keratosis) (15) Scalp x 1, L ear x 2  Destruction of lesion - Scalp x 1, L ear x 2 (15) Complexity: simple   Destruction method: cryotherapy   Informed consent: discussed and consent obtained   Timeout:  patient name, date of birth, surgical site, and procedure verified Lesion destroyed using liquid nitrogen: Yes   Region frozen until ice ball extended beyond lesion: Yes   Outcome: patient tolerated procedure well with no complications   Post-procedure details: wound care instructions given    SEBORRHEIC KERATOSIS - Stuck-on, waxy, tan-brown papules and/or plaques  - Benign-appearing - Discussed benign etiology and prognosis. - Observe - Call for any changes  LENTIGINES Exam: scattered tan macules Due to sun exposure Treatment Plan: Benign-appearing, observe. Recommend daily broad spectrum sunscreen SPF 30+ to sun-exposed areas, reapply every 2 hours as needed.  Call for any changes  Return for follow up  in 10- 12 weeks, recheck MMIS site.  Maylene Roes, CMA, am acting as scribe for Armida Sans, MD .  Documentation: I have reviewed the above documentation for accuracy and completeness, and I agree with the above.  Armida Sans, MD

## 2023-04-18 ENCOUNTER — Encounter: Payer: Self-pay | Admitting: Dermatology

## 2023-05-01 ENCOUNTER — Other Ambulatory Visit: Payer: Self-pay | Admitting: Cardiovascular Disease

## 2023-05-01 DIAGNOSIS — I10 Essential (primary) hypertension: Secondary | ICD-10-CM

## 2023-05-07 ENCOUNTER — Encounter: Payer: Self-pay | Admitting: Internal Medicine

## 2023-05-07 ENCOUNTER — Ambulatory Visit: Payer: Medicare HMO | Admitting: Internal Medicine

## 2023-05-07 VITALS — BP 150/90 | HR 64 | Ht 69.0 in | Wt 185.2 lb

## 2023-05-07 DIAGNOSIS — E782 Mixed hyperlipidemia: Secondary | ICD-10-CM | POA: Diagnosis not present

## 2023-05-07 DIAGNOSIS — K219 Gastro-esophageal reflux disease without esophagitis: Secondary | ICD-10-CM | POA: Insufficient documentation

## 2023-05-07 DIAGNOSIS — R1010 Upper abdominal pain, unspecified: Secondary | ICD-10-CM

## 2023-05-07 DIAGNOSIS — I10 Essential (primary) hypertension: Secondary | ICD-10-CM

## 2023-05-07 DIAGNOSIS — R110 Nausea: Secondary | ICD-10-CM | POA: Diagnosis not present

## 2023-05-07 MED ORDER — PANTOPRAZOLE SODIUM 40 MG PO TBEC: 40.0000 mg | DELAYED_RELEASE_TABLET | Freq: Every day | ORAL | 3 refills | Status: DC

## 2023-05-07 MED ORDER — ONDANSETRON HCL 4 MG PO TABS: 4.0000 mg | ORAL_TABLET | Freq: Three times a day (TID) | ORAL | 0 refills | Status: DC | PRN

## 2023-05-07 NOTE — Progress Notes (Signed)
Established Patient Office Visit  Subjective:  Patient ID: James [name], male    DOB: 13-Oct-1935  Age: 87 y.o. MRN: [id number]  Chief Complaint  Patient presents with   Acute Visit    Trouble eating and pain in stomach    Patient comes in with reports of mid epigastric discomfort which started about a month ago.  Slowly has been getting worse and feels the discomfort after each meal.  He is trying to eat healthy and avoid certain things which may cause worsening of this sensation.  He also notes some nausea recently.  But there is no vomiting no diarrhea and no constipation.  He did not notice any blood in the stools or black-colored stools. Patient does have a history of hiatal hernia and is supposed to be on Protonix, which he admits that he has not taken for several months. Will resume Protonix and check blood work today.  Meanwhile patient advised to eat light, bland diet. Consider imaging studies at follow-up if not better    No other concerns at this time.   Past Medical History:  Diagnosis Date   Anxiety    Aortic atherosclerosis (HCC)    Aortic dilatation (HCC) 01/10/2021   a.) CT 01/10/2021: infrarenal aortic dilitation measuring 3.3 x 3.3 cm. b.) CT 06/23/2021: measured 3.4 cm.   Arthritis    B12 deficiency    Basal cell carcinoma 03/14/2019   R nasal tip    Bilateral renal cysts    BPH (benign prostatic hyperplasia)    Bradycardia    Chronic kidney disease    COPD (chronic obstructive pulmonary disease) (HCC)    Coronary artery disease    a.) s/p 2v CABG in 2001; performed while living in [facility] [place].   Diastolic dysfunction 06/2020   a.) TTE 06/2020: EF 60%; sev LA dilation, mild RA dilation; triv PR, mild to mod TR/MR/AR; G1DD.   Frequency of urination    GERD (gastroesophageal reflux disease)    Hiatal hernia    History of kidney stones    HLD (hyperlipidemia)    Hypertension    Internal hemorrhoids    Long term current use of anticoagulant     a.) Rivaroxaban   Melanoma (HCC) 11/11/2022   right infra orbital - MMIS discuss tx options   Mobitz type 1 second degree AV block    OSA (obstructive sleep apnea)    a.) does not require nocturnal PAP therapy   Osteopenia    Pulmonary HTN (HCC) 06/2020   a.) mild   S/P CABG x 2 2001   a.) details unknown; performed in 2001 in [place].    Past Surgical History:  Procedure Laterality Date   CARDIAC CATHETERIZATION     COLONOSCOPY     CORONARY ARTERY BYPASS GRAFT     CYSTOSCOPY/URETEROSCOPY/HOLMIUM LASER/STENT PLACEMENT Right 11/07/2021   Procedure: CYSTOSCOPY/URETEROSCOPY/HOLMIUM LASER/STENT PLACEMENT;  Surgeon: Sondra Come, MD;  Location: [facility] ORS;  Service: Urology;  Laterality: Right;   FRACTURE SURGERY Left    fractured left arm   HOLEP-LASER ENUCLEATION OF THE PROSTATE WITH MORCELLATION N/A 11/07/2021   Procedure: HOLEP-LASER ENUCLEATION OF THE PROSTATE WITH MORCELLATION;  Surgeon: Sondra Come, MD;  Location: [facility] ORS;  Service: Urology;  Laterality: N/A;   LIPOMA EXCISION N/A 11/21/2018   Procedure: EXCISION BACK LIPOMA;  Surgeon: Griselda Miner, MD;  Location: [facility] OR;  Service: General;  Laterality: N/A;   PILONIDAL CYST EXCISION     SKIN CANCER EXCISION  TOTAL SHOULDER REPLACEMENT Left    UMBILICAL HERNIA REPAIR N/A 11/21/2018   Procedure: HERNIA REPAIR UMBILICAL ADULT WITH MESH;  Surgeon: Griselda Miner, MD;  [facility];  Service: General;  Laterality: N/A;    Social History   Socioeconomic History   Marital status: Widowed    Spouse name: Not on file   Number of children: Not on file   Years of education: Not on file   Highest education level: Not on file  Occupational History   Not on file  Tobacco Use   Smoking status: Former    Current packs/day: 0.00    Average packs/day: 1 pack/day for 20.0 years (20.0 ttl pk-yrs)    Types: Cigarettes    Start date: 09/21/1993    Quit date: 09/21/2013    Years since quitting: 9.6    Passive exposure:  Past   Smokeless tobacco: Never   Tobacco comments:    quit on and off over the years  Vaping Use   Vaping status: Never Used  Substance and Sexual Activity   Alcohol use: Yes    Comment: socially   Drug use: No   Sexual activity: Yes    Birth control/protection: None  Other Topics Concern   Not on file  Social History Narrative   Not on file   Social Determinants of Health   Financial Resource Strain: Not on file  Food Insecurity: Not on file  Transportation Needs: Not on file  Physical Activity: Not on file  Stress: Not on file  Social Connections: Not on file  Intimate Partner Violence: Not on file    Family History  Problem Relation Age of Onset   Prostate cancer Neg Hx    Bladder Cancer Neg Hx    Kidney cancer Neg Hx     No Known Allergies  Review of Systems  Constitutional: Negative.  Negative for chills, fever, malaise/fatigue and weight loss.  HENT: Negative.    Eyes: Negative.   Respiratory: Negative.  Negative for cough and shortness of breath.   Cardiovascular: Negative.  Negative for chest pain, palpitations and leg swelling.  Gastrointestinal:  Positive for abdominal pain, heartburn and nausea. Negative for blood in stool, constipation, diarrhea, melena and vomiting.  Genitourinary: Negative.  Negative for dysuria and flank pain.  Musculoskeletal: Negative.  Negative for joint pain and myalgias.  Skin: Negative.   Neurological: Negative.  Negative for dizziness and headaches.  Endo/Heme/Allergies: Negative.   Psychiatric/Behavioral: Negative.  Negative for depression and suicidal ideas. The patient is not nervous/anxious.        Objective:   BP (!) 150/90   Pulse 64   Ht 5\' 9"  (1.753 m)   Wt 185 lb 3.2 oz (84 kg)   SpO2 96%   BMI 27.35 kg/m   Vitals:   05/07/23 1158  BP: (!) 150/90  Pulse: 64  Height: 5\' 9"  (1.753 m)  Weight: 185 lb 3.2 oz (84 kg)  SpO2: 96%  BMI (Calculated): 27.34    Physical Exam Vitals and nursing note  reviewed.  Constitutional:      Appearance: Normal appearance.  HENT:     Head: Normocephalic and atraumatic.     Nose: Nose normal.     Mouth/Throat:     Mouth: Mucous membranes are moist.     Pharynx: Oropharynx is clear.  Eyes:     Conjunctiva/sclera: Conjunctivae normal.     Pupils: Pupils are equal, round, and reactive to light.  Cardiovascular:  Rate and Rhythm: Normal rate and regular rhythm.     Pulses: Normal pulses.     Heart sounds: Normal heart sounds.  Pulmonary:     Effort: Pulmonary effort is normal.     Breath sounds: Normal breath sounds.  Abdominal:     General: Bowel sounds are normal. There is no distension.     Palpations: Abdomen is soft. There is no mass.     Tenderness: There is no abdominal tenderness. There is no right CVA tenderness, left CVA tenderness, guarding or rebound.     Hernia: No hernia is present.  Musculoskeletal:        General: Normal range of motion.     Cervical back: Normal range of motion.     Right lower leg: No edema.     Left lower leg: No edema.  Skin:    General: Skin is warm and dry.     Coloration: Skin is not jaundiced.  Neurological:     General: No focal deficit present.     Mental Status: He is alert and oriented to person, place, and time.  Psychiatric:        Mood and Affect: Mood normal.        Behavior: Behavior normal.        Judgment: Judgment normal.      No results found for any visits on 05/07/23.  No results found for this or any previous visit (from the past 2160 hour(s)).    Assessment & Plan:  Start back on Protonix.  Zofran as needed for nausea ,gentle diet. Blood work today. Problem List Items Addressed This Visit     Essential hypertension, benign   Relevant Orders   CMP14+EGFR   Hyperlipidemia   Relevant Orders   Lipid Panel w/o Chol/HDL Ratio   Other Visit Diagnoses     Gastroesophageal reflux disease without esophagitis    -  Primary   Relevant Medications   pantoprazole  (PROTONIX) 40 MG tablet   ondansetron (ZOFRAN) 4 MG tablet   Other Relevant Orders   CBC with Diff   Pain of upper abdomen       Relevant Orders   Lipase   Nausea       Relevant Medications   ondansetron (ZOFRAN) 4 MG tablet       Return in about 1 week (around 05/14/2023).   Total time spent: 30 minutes  Margaretann Loveless, MD  05/07/2023   This document may have been prepared by [facility] Voice Recognition software and as such may include unintentional dictation errors.

## 2023-05-08 LAB — CBC WITH DIFFERENTIAL/PLATELET
Basophils Absolute: 0.1 10*3/uL (ref 0.0–0.2)
Basos: 1 %
EOS (ABSOLUTE): 0.2 10*3/uL (ref 0.0–0.4)
Eos: 3 %
Hematocrit: 41.1 % (ref 37.5–51.0)
Hemoglobin: 13.8 g/dL (ref 13.0–17.7)
Immature Grans (Abs): 0 10*3/uL (ref 0.0–0.1)
Immature Granulocytes: 0 %
Lymphocytes Absolute: 1.4 10*3/uL (ref 0.7–3.1)
Lymphs: 26 %
MCH: 32.1 pg (ref 26.6–33.0)
MCHC: 33.6 g/dL (ref 31.5–35.7)
MCV: 96 fL (ref 79–97)
Monocytes Absolute: 0.5 10*3/uL (ref 0.1–0.9)
Monocytes: 9 %
Neutrophils Absolute: 3.2 10*3/uL (ref 1.4–7.0)
Neutrophils: 61 %
Platelets: 152 10*3/uL (ref 150–450)
RBC: 4.3 x10E6/uL (ref 4.14–5.80)
RDW: 13.1 % (ref 11.6–15.4)
WBC: 5.2 10*3/uL (ref 3.4–10.8)

## 2023-05-08 LAB — CMP14+EGFR
ALT: 17 IU/L (ref 0–44)
AST: 18 IU/L (ref 0–40)
Albumin: 4.3 g/dL (ref 3.7–4.7)
Alkaline Phosphatase: 87 IU/L (ref 44–121)
BUN/Creatinine Ratio: 23 (ref 10–24)
BUN: 24 mg/dL (ref 8–27)
Bilirubin Total: 0.7 mg/dL (ref 0.0–1.2)
CO2: 24 mmol/L (ref 20–29)
Calcium: 10 mg/dL (ref 8.6–10.2)
Chloride: 106 mmol/L (ref 96–106)
Creatinine, Ser: 1.04 mg/dL (ref 0.76–1.27)
Globulin, Total: 2.3 g/dL (ref 1.5–4.5)
Glucose: 102 mg/dL — ABNORMAL HIGH (ref 70–99)
Potassium: 4.3 mmol/L (ref 3.5–5.2)
Sodium: 143 mmol/L (ref 134–144)
Total Protein: 6.6 g/dL (ref 6.0–8.5)
eGFR: 71 mL/min/{1.73_m2} (ref >59)

## 2023-05-08 LAB — LIPID PANEL W/O CHOL/HDL RATIO
Cholesterol, Total: 106 mg/dL (ref 100–199)
HDL: 62 mg/dL (ref >39)
LDL Chol Calc (NIH): 32 mg/dL (ref 0–99)
Triglycerides: 46 mg/dL (ref 0–149)
VLDL Cholesterol Cal: 12 mg/dL (ref 5–40)

## 2023-05-08 LAB — LIPASE: Lipase: 28 U/L (ref 13–78)

## 2023-05-14 ENCOUNTER — Ambulatory Visit: Payer: Medicare HMO | Admitting: Internal Medicine

## 2023-05-14 ENCOUNTER — Encounter: Payer: Self-pay | Admitting: Internal Medicine

## 2023-05-14 VITALS — BP 140/82 | HR 33 | Ht 69.0 in | Wt 185.2 lb

## 2023-05-14 DIAGNOSIS — K219 Gastro-esophageal reflux disease without esophagitis: Secondary | ICD-10-CM | POA: Diagnosis not present

## 2023-05-14 DIAGNOSIS — I10 Essential (primary) hypertension: Secondary | ICD-10-CM | POA: Diagnosis not present

## 2023-05-14 DIAGNOSIS — E782 Mixed hyperlipidemia: Secondary | ICD-10-CM | POA: Diagnosis not present

## 2023-05-14 DIAGNOSIS — R001 Bradycardia, unspecified: Secondary | ICD-10-CM

## 2023-05-14 DIAGNOSIS — Z951 Presence of aortocoronary bypass graft: Secondary | ICD-10-CM

## 2023-05-14 NOTE — Progress Notes (Signed)
Established Patient Office Visit  Subjective:  Patient ID: James [name], male    DOB: 11-11-1935  Age: 87 y.o. MRN: [id number]  Chief Complaint  Patient presents with   Follow-up    1 week follow up    Patient comes in for follow-up of his abdominal symptoms.  His Protonix was refilled at last visit which she is not taking regularly.  Reports that he is feeling much better and is no longer having nausea, upset stomach or indigestion.  His labs were discussed which show her normal H&H.   He denies headache or dizziness today, no chest pain or shortness of breath . However his initial pulse was very low at 33. His EKG shows sinus bradycardia with PVCs, relatively unchanged from previous EKG.  Patient has missed his appointment with cardiology, will set up again.    No other concerns at this time.   Past Medical History:  Diagnosis Date   Anxiety    Aortic atherosclerosis (HCC)    Aortic dilatation (HCC) 01/10/2021   a.) CT 01/10/2021: infrarenal aortic dilitation measuring 3.3 x 3.3 cm. b.) CT 06/23/2021: measured 3.4 cm.   Arthritis    B12 deficiency    Basal cell carcinoma 03/14/2019   R nasal tip    Bilateral renal cysts    BPH (benign prostatic hyperplasia)    Bradycardia    Chronic kidney disease    COPD (chronic obstructive pulmonary disease) (HCC)    Coronary artery disease    a.) s/p 2v CABG in 2001; performed while living in [facility] [place].   Diastolic dysfunction 06/2020   a.) TTE 06/2020: EF 60%; sev LA dilation, mild RA dilation; triv PR, mild to mod TR/MR/AR; G1DD.   Frequency of urination    GERD (gastroesophageal reflux disease)    Hiatal hernia    History of kidney stones    HLD (hyperlipidemia)    Hypertension    Internal hemorrhoids    Long term current use of anticoagulant    a.) Rivaroxaban   Melanoma (HCC) 11/11/2022   right infra orbital - MMIS discuss tx options   Mobitz type 1 second degree AV block    OSA (obstructive sleep  apnea)    a.) does not require nocturnal PAP therapy   Osteopenia    Pulmonary HTN (HCC) 06/2020   a.) mild   S/P CABG x 2 2001   a.) details unknown; performed in 2001 in [place].    Past Surgical History:  Procedure Laterality Date   CARDIAC CATHETERIZATION     COLONOSCOPY     CORONARY ARTERY BYPASS GRAFT     CYSTOSCOPY/URETEROSCOPY/HOLMIUM LASER/STENT PLACEMENT Right 11/07/2021   Procedure: CYSTOSCOPY/URETEROSCOPY/HOLMIUM LASER/STENT PLACEMENT;  Surgeon: Sondra Come, MD;  Location: [facility] ORS;  Service: Urology;  Laterality: Right;   FRACTURE SURGERY Left    fractured left arm   HOLEP-LASER ENUCLEATION OF THE PROSTATE WITH MORCELLATION N/A 11/07/2021   Procedure: HOLEP-LASER ENUCLEATION OF THE PROSTATE WITH MORCELLATION;  Surgeon: Sondra Come, MD;  Location: [facility] ORS;  Service: Urology;  Laterality: N/A;   LIPOMA EXCISION N/A 11/21/2018   Procedure: EXCISION BACK LIPOMA;  Surgeon: Griselda Miner, MD;  Location: [facility] OR;  Service: General;  Laterality: N/A;   PILONIDAL CYST EXCISION     SKIN CANCER EXCISION     TOTAL SHOULDER REPLACEMENT Left    UMBILICAL HERNIA REPAIR N/A 11/21/2018   Procedure: HERNIA REPAIR UMBILICAL ADULT WITH MESH;  Surgeon: Griselda Miner, MD;  Location: [facility]  OR;  Service: General;  Laterality: N/A;    Social History   Socioeconomic History   Marital status: Widowed    Spouse name: Not on file   Number of children: Not on file   Years of education: Not on file   Highest education level: Not on file  Occupational History   Not on file  Tobacco Use   Smoking status: Former    Current packs/day: 0.00    Average packs/day: 1 pack/day for 20.0 years (20.0 ttl pk-yrs)    Types: Cigarettes    Start date: 09/21/1993    Quit date: 09/21/2013    Years since quitting: 9.6    Passive exposure: Past   Smokeless tobacco: Never   Tobacco comments:    quit on and off over the years  Vaping Use   Vaping status: Never Used  Substance and Sexual Activity    Alcohol use: Yes    Comment: socially   Drug use: No   Sexual activity: Yes    Birth control/protection: None  Other Topics Concern   Not on file  Social History Narrative   Not on file   Social Determinants of Health   Financial Resource Strain: Not on file  Food Insecurity: Not on file  Transportation Needs: Not on file  Physical Activity: Not on file  Stress: Not on file  Social Connections: Not on file  Intimate Partner Violence: Not on file    Family History  Problem Relation Age of Onset   Prostate cancer Neg Hx    Bladder Cancer Neg Hx    Kidney cancer Neg Hx     No Known Allergies  Review of Systems  Constitutional: Negative.  Negative for chills, diaphoresis, fever, malaise/fatigue and weight loss.  HENT: Negative.    Eyes: Negative.   Respiratory: Negative.  Negative for cough and shortness of breath.   Cardiovascular: Negative.  Negative for chest pain, palpitations and leg swelling.  Gastrointestinal: Negative.  Negative for abdominal pain, constipation, diarrhea, heartburn, nausea and vomiting.  Genitourinary: Negative.  Negative for dysuria and flank pain.  Musculoskeletal: Negative.  Negative for joint pain and myalgias.  Skin: Negative.   Neurological: Negative.  Negative for dizziness and headaches.  Endo/Heme/Allergies: Negative.   Psychiatric/Behavioral: Negative.  Negative for depression and suicidal ideas. The patient is not nervous/anxious.        Objective:   BP (!) 140/82   Pulse (!) 33   Ht 5\' 9"  (1.753 m)   Wt 185 lb 3.2 oz (84 kg)   SpO2 96%   BMI 27.35 kg/m   Vitals:   05/14/23 0955  BP: (!) 140/82  Pulse: (!) 33  Height: 5\' 9"  (1.753 m)  Weight: 185 lb 3.2 oz (84 kg)  SpO2: 96%  BMI (Calculated): 27.34    Physical Exam Vitals and nursing note reviewed.  Constitutional:      Appearance: Normal appearance.  HENT:     Head: Normocephalic and atraumatic.     Nose: Nose normal.     Mouth/Throat:     Mouth: Mucous  membranes are moist.     Pharynx: Oropharynx is clear.  Eyes:     Conjunctiva/sclera: Conjunctivae normal.     Pupils: Pupils are equal, round, and reactive to light.  Cardiovascular:     Rate and Rhythm: Normal rate and regular rhythm.     Pulses: Normal pulses.     Heart sounds: Murmur heard.  Pulmonary:     Effort: Pulmonary effort is  normal.     Breath sounds: Normal breath sounds.  Abdominal:     General: Bowel sounds are normal.     Palpations: Abdomen is soft.  Musculoskeletal:        General: Normal range of motion.     Cervical back: Normal range of motion.  Skin:    General: Skin is warm and dry.  Neurological:     General: No focal deficit present.     Mental Status: He is alert and oriented to person, place, and time.  Psychiatric:        Mood and Affect: Mood normal.        Behavior: Behavior normal.        Judgment: Judgment normal.      No results found for any visits on 05/14/23.  Recent Results (from the past 2160 hour(s))  Lipase     Status: None   Collection Time: 05/07/23 12:24 PM  Result Value Ref Range   Lipase 28 13 - 78 U/L  CBC with Diff     Status: None   Collection Time: 05/07/23 12:24 PM  Result Value Ref Range   WBC 5.2 3.4 - 10.8 x10E3/uL   RBC 4.30 4.14 - 5.80 x10E6/uL   Hemoglobin 13.8 13.0 - 17.7 g/dL   Hematocrit 16.1 09.6 - 51.0 %   MCV 96 79 - 97 fL   MCH 32.1 26.6 - 33.0 pg   MCHC 33.6 31.5 - 35.7 g/dL   RDW 04.5 40.9 - 81.1 %   Platelets 152 150 - 450 x10E3/uL   Neutrophils 61 Not Estab. %   Lymphs 26 Not Estab. %   Monocytes 9 Not Estab. %   Eos 3 Not Estab. %   Basos 1 Not Estab. %   Neutrophils Absolute 3.2 1.4 - 7.0 x10E3/uL   Lymphocytes Absolute 1.4 0.7 - 3.1 x10E3/uL   Monocytes Absolute 0.5 0.1 - 0.9 x10E3/uL   EOS (ABSOLUTE) 0.2 0.0 - 0.4 x10E3/uL   Basophils Absolute 0.1 0.0 - 0.2 x10E3/uL   Immature Granulocytes 0 Not Estab. %   Immature Grans (Abs) 0.0 0.0 - 0.1 x10E3/uL  CMP14+EGFR     Status: Abnormal    Collection Time: 05/07/23 12:24 PM  Result Value Ref Range   Glucose 102 (H) 70 - 99 mg/dL   BUN 24 8 - 27 mg/dL   Creatinine, Ser 9.14 0.76 - 1.27 mg/dL   eGFR 71 >78 GN/FAO/1.30   BUN/Creatinine Ratio 23 10 - 24   Sodium 143 134 - 144 mmol/L   Potassium 4.3 3.5 - 5.2 mmol/L   Chloride 106 96 - 106 mmol/L   CO2 24 20 - 29 mmol/L   Calcium 10.0 8.6 - 10.2 mg/dL   Total Protein 6.6 6.0 - 8.5 g/dL   Albumin 4.3 3.7 - 4.7 g/dL   Globulin, Total 2.3 1.5 - 4.5 g/dL   Bilirubin Total 0.7 0.0 - 1.2 mg/dL   Alkaline Phosphatase 87 44 - 121 IU/L   AST 18 0 - 40 IU/L   ALT 17 0 - 44 IU/L  Lipid Panel w/o Chol/HDL Ratio     Status: None   Collection Time: 05/07/23 12:24 PM  Result Value Ref Range   Cholesterol, Total 106 100 - 199 mg/dL   Triglycerides 46 0 - 149 mg/dL   HDL 62 >86 mg/dL   VLDL Cholesterol Cal 12 5 - 40 mg/dL   LDL Chol Calc (NIH) 32 0 - 99 mg/dL  Assessment & Plan:  Continue all medications. Cardiology follow-up. Problem List Items Addressed This Visit     Bradycardia, sinus   Relevant Orders   Ambulatory referral to Cardiology   EKG 12-Lead   Essential hypertension, benign   Hx of CABG   Hyperlipidemia   Gastroesophageal reflux disease without esophagitis - Primary    Return in about 3 months (around 08/14/2023).   Total time spent: 30 minutes  Margaretann Loveless, MD  05/14/2023   This document may have been prepared by [facility] Voice Recognition software and as such may include unintentional dictation errors.

## 2023-05-16 IMAGING — CR DG FOOT COMPLETE 3+V*R*
1 series · 3 of 3 positions shown · non-contrast
Comparison: None.

CLINICAL DATA: Right foot pain and swelling at 1st digit x 2 weeks.

EXAM:
RIGHT FOOT COMPLETE - 3+ VIEW

[Series 1: dg foot complete right · 0.14mm/px · 3 of 3 slices shown]
[im 1/3]
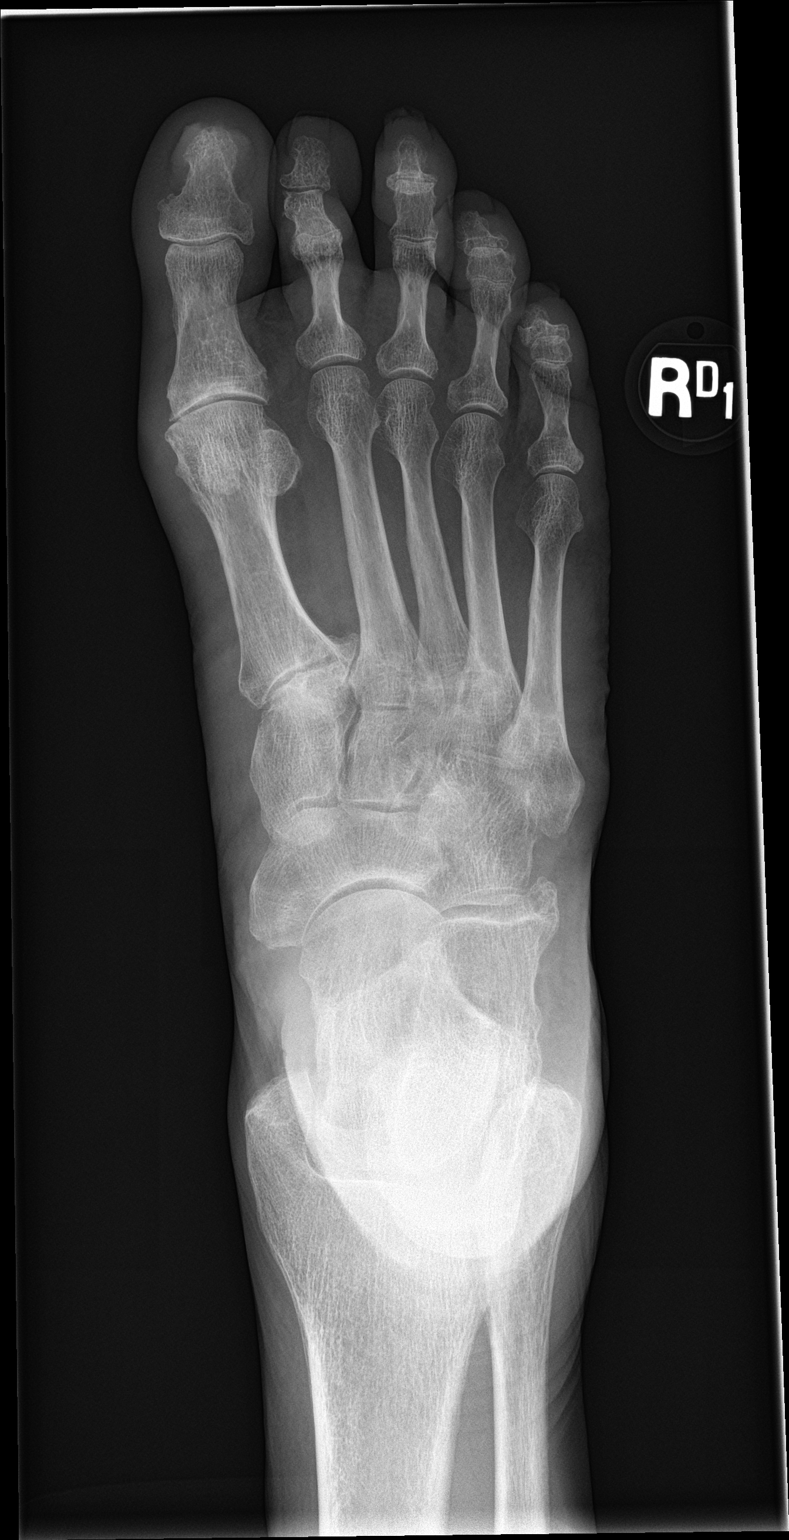
[im 2/3]
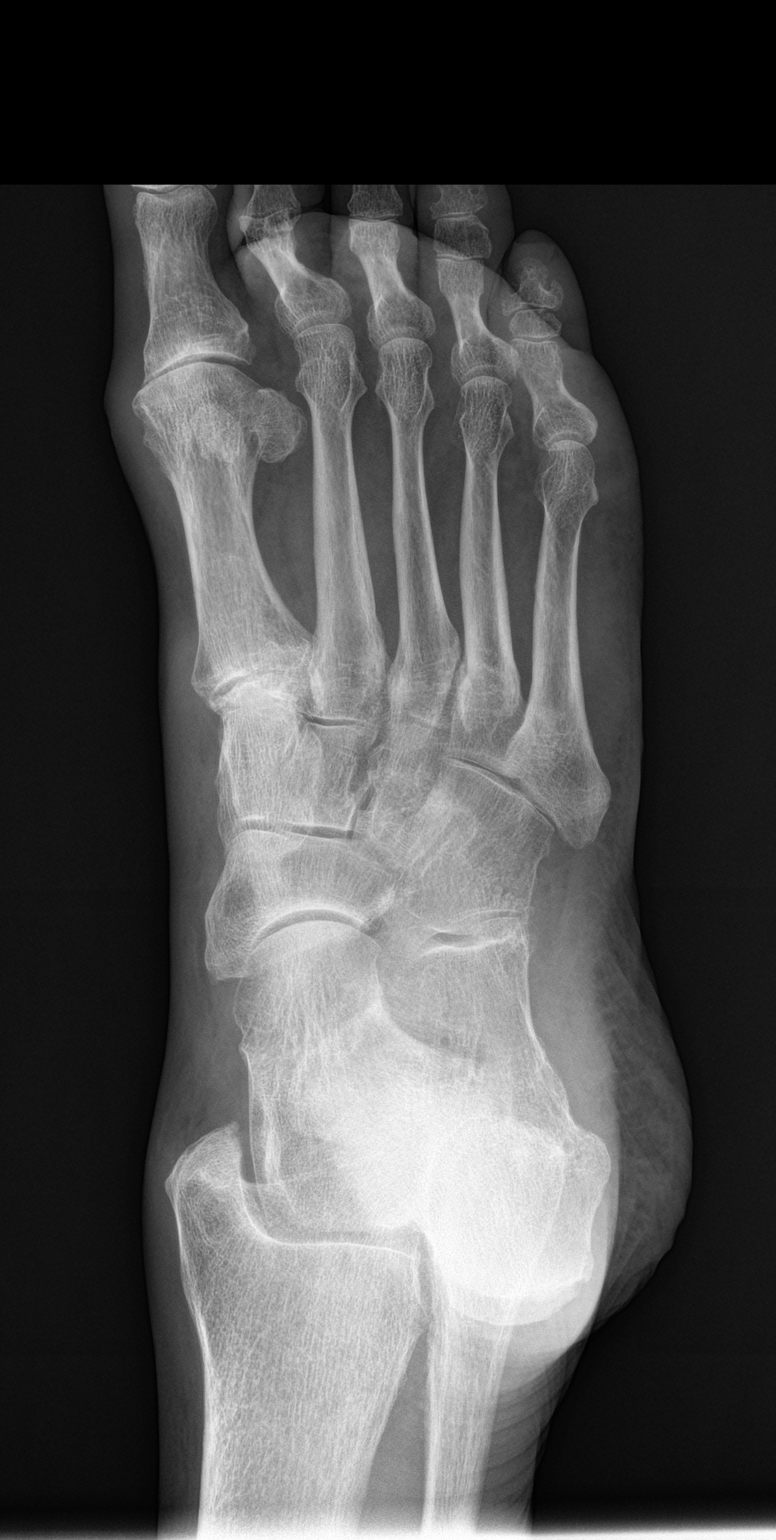
[im 3/3]
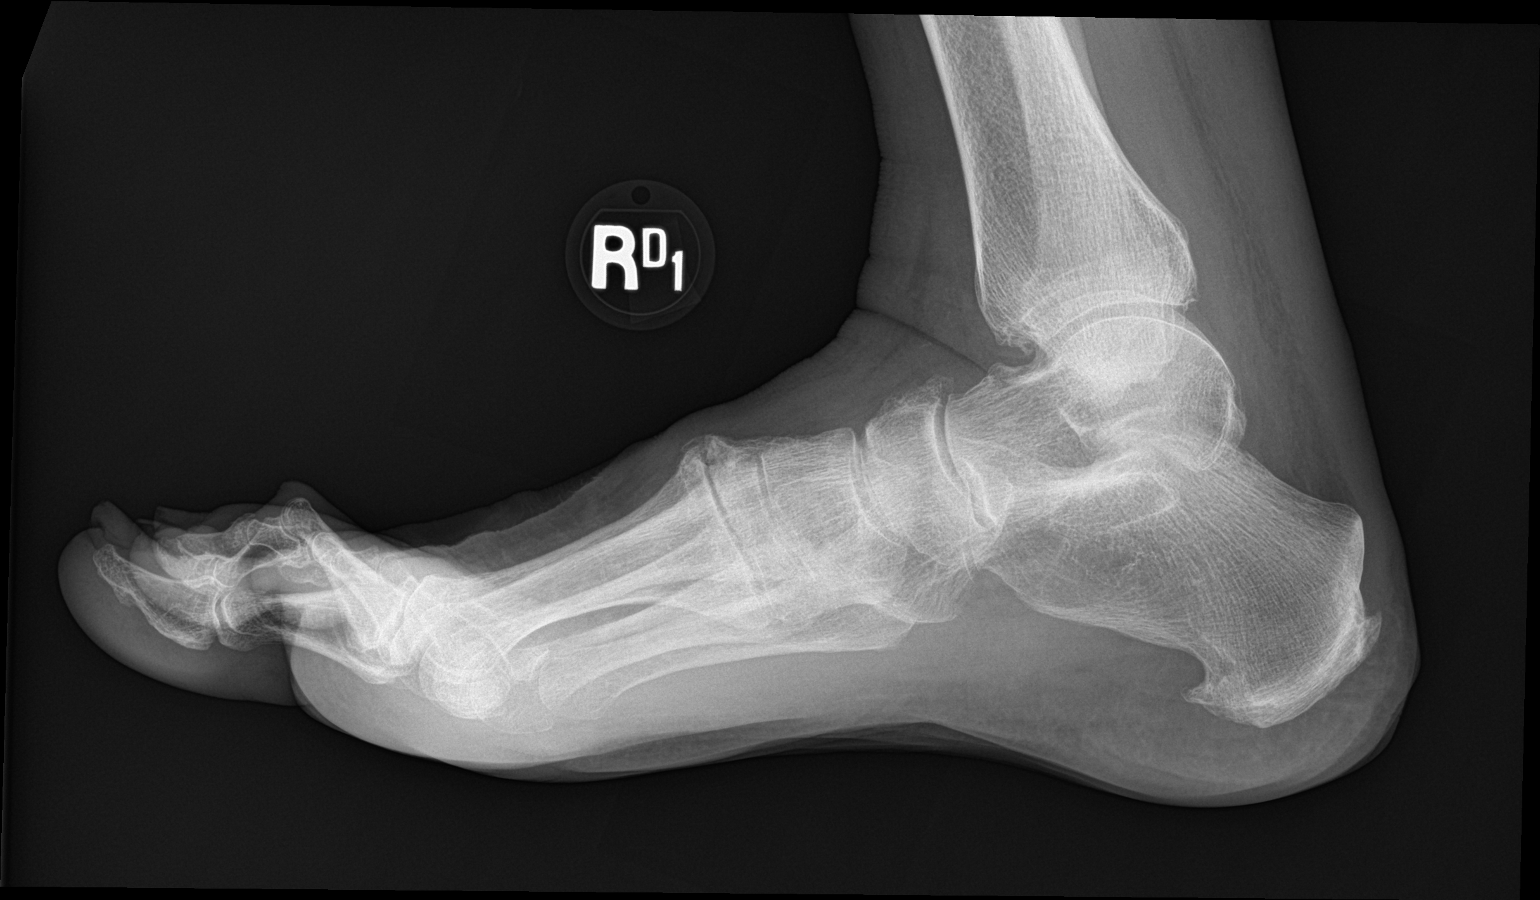

[3 of 3 positions shown; findings below may reference images not displayed]

FINDINGS: Moderate to severe great toe metatarsophalangeal joint space
narrowing with subchondral sclerosis and mild peripheral
osteophytosis.

Mild joint space narrowing of the interphalangeal joints diffusely.
Moderate joint space narrowing of the first tarsometatarsal joint
with moderate to high-grade dorsal degenerative osteophytes. Mild
joint space narrowing of the second through fifth tarsometatarsal
joints.

Mild dorsal talonavicular osteophytosis.

Mild plantar calcaneal heel spur. Mild chronic spurring at the
Achilles insertion on the calcaneus.
IMPRESSION: Moderate to severe great toe metatarsophalangeal joint and
tarsometatarsal osteoarthritis.

## 2023-05-17 ENCOUNTER — Ambulatory Visit: Payer: Medicare HMO | Admitting: Cardiovascular Disease

## 2023-05-17 ENCOUNTER — Encounter: Payer: Self-pay | Admitting: Cardiovascular Disease

## 2023-05-17 VITALS — BP 110/70 | HR 48 | Ht 69.0 in | Wt 185.2 lb

## 2023-05-17 DIAGNOSIS — R001 Bradycardia, unspecified: Secondary | ICD-10-CM

## 2023-05-17 DIAGNOSIS — I700 Atherosclerosis of aorta: Secondary | ICD-10-CM | POA: Diagnosis not present

## 2023-05-17 DIAGNOSIS — I25712 Atherosclerosis of autologous vein coronary artery bypass graft(s) with refractory angina pectoris: Secondary | ICD-10-CM

## 2023-05-17 DIAGNOSIS — I10 Essential (primary) hypertension: Secondary | ICD-10-CM | POA: Diagnosis not present

## 2023-05-17 DIAGNOSIS — R42 Dizziness and giddiness: Secondary | ICD-10-CM | POA: Diagnosis not present

## 2023-05-17 DIAGNOSIS — Z951 Presence of aortocoronary bypass graft: Secondary | ICD-10-CM | POA: Diagnosis not present

## 2023-05-17 NOTE — Progress Notes (Signed)
Cardiology Office Note   Date:  05/17/2023   ID:  James [name], DOB Jun 13, 1936, MRN [id number]  PCP:  Margaretann Loveless, MD  Cardiologist:  Adrian Blackwater, MD      History of Present Illness: James [name] is a 87 y.o. male who presents for  Chief Complaint  Patient presents with   Follow-up    F/U    Had EKG showing heart rate 34      Past Medical History:  Diagnosis Date   Anxiety    Aortic atherosclerosis (HCC)    Aortic dilatation (HCC) 01/10/2021   a.) CT 01/10/2021: infrarenal aortic dilitation measuring 3.3 x 3.3 cm. b.) CT 06/23/2021: measured 3.4 cm.   Arthritis    B12 deficiency    Basal cell carcinoma 03/14/2019   R nasal tip    Bilateral renal cysts    BPH (benign prostatic hyperplasia)    Bradycardia    Chronic kidney disease    COPD (chronic obstructive pulmonary disease) (HCC)    Coronary artery disease    a.) s/p 2v CABG in 2001; performed while living in [facility] [place].   Diastolic dysfunction 06/2020   a.) TTE 06/2020: EF 60%; sev LA dilation, mild RA dilation; triv PR, mild to mod TR/MR/AR; G1DD.   Frequency of urination    GERD (gastroesophageal reflux disease)    Hiatal hernia    History of kidney stones    HLD (hyperlipidemia)    Hypertension    Internal hemorrhoids    Long term current use of anticoagulant    a.) Rivaroxaban   Melanoma (HCC) 11/11/2022   right infra orbital - MMIS discuss tx options   Mobitz type 1 second degree AV block    OSA (obstructive sleep apnea)    a.) does not require nocturnal PAP therapy   Osteopenia    Pulmonary HTN (HCC) 06/2020   a.) mild   S/P CABG x 2 2001   a.) details unknown; performed in 2001 in [place].     Past Surgical History:  Procedure Laterality Date   CARDIAC CATHETERIZATION     COLONOSCOPY     CORONARY ARTERY BYPASS GRAFT     CYSTOSCOPY/URETEROSCOPY/HOLMIUM LASER/STENT PLACEMENT Right 11/07/2021   Procedure: CYSTOSCOPY/URETEROSCOPY/HOLMIUM LASER/STENT PLACEMENT;   Surgeon: Sondra Come, MD;  Location: [facility] ORS;  Service: Urology;  Laterality: Right;   FRACTURE SURGERY Left    fractured left arm   HOLEP-LASER ENUCLEATION OF THE PROSTATE WITH MORCELLATION N/A 11/07/2021   Procedure: HOLEP-LASER ENUCLEATION OF THE PROSTATE WITH MORCELLATION;  Surgeon: Sondra Come, MD;  Location: [facility] ORS;  Service: Urology;  Laterality: N/A;   LIPOMA EXCISION N/A 11/21/2018   Procedure: EXCISION BACK LIPOMA;  Surgeon: Griselda Miner, MD;  Location: [facility] OR;  Service: General;  Laterality: N/A;   PILONIDAL CYST EXCISION     SKIN CANCER EXCISION     TOTAL SHOULDER REPLACEMENT Left    UMBILICAL HERNIA REPAIR N/A 11/21/2018   Procedure: HERNIA REPAIR UMBILICAL ADULT WITH MESH;  Surgeon: Griselda Miner, MD;  Location: [facility] OR;  Service: General;  Laterality: N/A;     Current Outpatient Medications  Medication Sig Dispense Refill   citalopram (CELEXA) 20 MG tablet Take 1 tablet (20 mg total) by mouth daily. 90 tablet 3   Coenzyme Q10 (CO Q-10 MAXIMUM STRENGTH PO) Take 1 capsule by mouth daily. MAX Q 10     enalapril (VASOTEC) 20 MG tablet TAKE 1 TABLET BY MOUTH EVERY DAY 90  tablet 0   Ferrous Sulfate Dried (SLOW RELEASE IRON) 45 MG TBCR Take 45 mg by mouth daily.     fluticasone (FLONASE) 50 MCG/ACT nasal spray Place 2 sprays into both nostrils daily as needed for allergies.     GEMTESA 75 MG TABS Take 1 tablet by mouth daily.     imiquimod (ALDARA) 5 % cream Apply topically as directed. Apply to area on right cheek 5 days a week 24 each 1   ondansetron (ZOFRAN) 4 MG tablet Take 1 tablet (4 mg total) by mouth every 8 (eight) hours as needed for nausea or vomiting. 30 tablet 0   pantoprazole (PROTONIX) 40 MG tablet Take 1 tablet (40 mg total) by mouth daily. 90 tablet 3   rosuvastatin (CRESTOR) 10 MG tablet Take 10 mg by mouth daily.     vitamin B-12 (CYANOCOBALAMIN) 500 MCG tablet      No current facility-administered medications for this visit.    Allergies:    Patient has no known allergies.    Social History:   reports that he quit smoking about 9 years ago. His smoking use included cigarettes. He started smoking about 29 years ago. He has a 20 pack-year smoking history. He has been exposed to tobacco smoke. He has never used smokeless tobacco. He reports current alcohol use. He reports that he does not use drugs.   Family History:  family history is not on file.    ROS:     Review of Systems  Constitutional: Negative.   HENT: Negative.    Eyes: Negative.   Respiratory: Negative.    Gastrointestinal: Negative.   Genitourinary: Negative.   Musculoskeletal: Negative.   Skin: Negative.   Neurological: Negative.   Endo/Heme/Allergies: Negative.   Psychiatric/Behavioral: Negative.    All other systems reviewed and are negative.     All other systems are reviewed and negative.    PHYSICAL EXAM: VS:  BP 110/70   Pulse (!) 48   Ht 5\' 9"  (1.753 m)   Wt 185 lb 3.2 oz (84 kg)   SpO2 98%   BMI 27.35 kg/m  , BMI Body mass index is 27.35 kg/m. Last weight:  Wt Readings from Last 3 Encounters:  05/17/23 185 lb 3.2 oz (84 kg)  05/14/23 185 lb 3.2 oz (84 kg)  05/07/23 185 lb 3.2 oz (84 kg)     Physical Exam Vitals reviewed.  Constitutional:      Appearance: Normal appearance. He is normal weight.  HENT:     Head: Normocephalic.     Nose: Nose normal.     Mouth/Throat:     Mouth: Mucous membranes are moist.  Eyes:     Pupils: Pupils are equal, round, and reactive to light.  Cardiovascular:     Rate and Rhythm: Normal rate and regular rhythm.     Pulses: Normal pulses.     Heart sounds: Normal heart sounds.  Pulmonary:     Effort: Pulmonary effort is normal.  Abdominal:     General: Abdomen is flat. Bowel sounds are normal.  Musculoskeletal:        General: Normal range of motion.     Cervical back: Normal range of motion.  Skin:    General: Skin is warm.  Neurological:     General: No focal deficit present.     Mental  Status: He is alert.  Psychiatric:        Mood and Affect: Mood normal.       EKG:  Recent Labs: 05/07/2023: ALT 17; BUN 24; Creatinine, Ser 1.04; Hemoglobin 13.8; Platelets 152; Potassium 4.3; Sodium 143    Lipid Panel    Component Value Date/Time   CHOL 106 05/07/2023 1224   TRIG 46 05/07/2023 1224   HDL 62 05/07/2023 1224   CHOLHDL 1.9 11/11/2022 1115   LDLCALC 32 05/07/2023 1224      REASON FOR VISIT  Referred by Dr.Gulianna Hornsby Welton Flakes.        TESTS  Imaging: Computed Tomographic Angiography:  Cardiac multidetector CT was performed paying particular attention to the coronary arteries for the diagnosis of: Essential Hypertension. Diagnostic Drugs:  Administered iohexol (Omnipaque) through an antecubital vein and images from the examination were analyzed for the presence and extent of coronary artery disease, using 3D image processing software. 100 mL of non-ionic contrast (Omnipaque) was used.        ASSESSMENT   The proximal LAD artery are abnormal:4  The mid-LAD artery was abnormal:4  The distal LAD artery was abnormal:5  The distal circumflex artery was abnormal:4  The proximal right coronary artery (RCA) was abnormal:2  The mid right coronary artery (RCA) was abnormal:2  The distal right coronary artery (RCA) was abnormal:2  The posterior descending coronary arteries were abnormal:2  The LIMA origin was visualized:to LAD  The SVG origin was visualized:to OM     TEST CONCLUSIONS  Quality of study: Fair.  1-Calcium score: 3367.2  2-Right dominant system.  3-LIMA to LAD and SVG to OM patent. RCA severely calcified with mild diffuse disease. Treat medically.      Adrian Blackwater MD  Electronically signed by: Adrian Blackwater     Date: 05/15/2019 11:33 REASON FOR VISIT  Visit for: Echocardiogram/I 25.10  Sex:   Male         wt= 180   lbs.  BP=146/84  Height=69    inches.        TESTS  Imaging: Echocardiogram:  An echocardiogram in (2-d) mode was  performed and in Doppler mode with color flow velocity mapping was performed. The aortic valve cusps are abnormal 2.1   cm, flow velocity 1.45   m/s, and systolic calculated mean flow gradient 4  mmHg. Mitral valve diastolic peak flow velocity E .731   m/s and E/A ratio 0.9. Aortic root diameter 4.3  cm. The LVOT internal diameter 4.4  cm and flow velocity was abnormal .953   m/s. LV systolic dimension 2.39    cm, diastolic 4.33  cm, posterior wall thickness 1.61    cm, fractional shortening 44.8 %, and EF 76.3  %. IVS thickness 1.49   cm. LA dimension 5.4 cm. Mitral Valve has Trace Regurgitation. Aortic Valve has Trace Regurgitation. Pulmonic Valve has Trace Regurgitation. Tricuspid Valve has Trace Regurgitation.     ASSESSMENT  Technically adequate study.  Normal chamber sizes.  Normal left ventricular systolic function.  Mild left ventricular hypertrophy with GRADE 1 (relaxation abnormality) diastolic dysfunction.  Normal right ventricular systolic function.  Normal right ventricular diastolic function.  Normal left ventricular wall motion.  Normal right ventricular wall motion.  Trace pulmonary regurgitation.  Trace tricuspid regurgitation..  Normal pulmonary artery pressure.  Trace mitral regurgitation.  Trace aortic regurgitation.  No pericardial effusion.  Mildly dilated Left atrium  Mild LVH.     THERAPY   Referring physician: Laurier Nancy  Sonographer:Fairy Ashlock Welton Flakes.      Adrian Blackwater MD  Electronically signed by: Adrian Blackwater     Date: 02/03/2022 08:45 Other studies Reviewed: Additional  studies/ records that were reviewed today include:  Review of the above records demonstrates:       No data to display            ASSESSMENT AND PLAN:    ICD-10-CM   1. had on ekg 05/14/23 nsr , with frequent atrial premaure contractions  R00.1 MYOCARDIAL PERFUSION IMAGING    PCV ECHOCARDIOGRAM COMPLETE   echo, stress test    2. 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 Essential hypertension, benign  I10 MYOCARDIAL PERFUSION IMAGING    PCV ECHOCARDIOGRAM COMPLETE    4. Hardening of the aorta (main artery of the heart) (HCC)  I70.0 MYOCARDIAL PERFUSION IMAGING    PCV ECHOCARDIOGRAM COMPLETE    5. Dizziness  R42 MYOCARDIAL PERFUSION IMAGING    PCV ECHOCARDIOGRAM COMPLETE    6. Hx of CABG  Z95.1 MYOCARDIAL PERFUSION IMAGING    PCV ECHOCARDIOGRAM COMPLETE       Problem List Items Addressed This Visit       Cardiovascular and Mediastinum   Bradycardia, sinus - Primary   Relevant Orders   MYOCARDIAL PERFUSION IMAGING   PCV ECHOCARDIOGRAM COMPLETE   Coronary atherosclerosis of autologous vein bypass graft   Relevant Orders   MYOCARDIAL PERFUSION IMAGING   PCV ECHOCARDIOGRAM COMPLETE   Essential hypertension, benign   Relevant Orders   MYOCARDIAL PERFUSION IMAGING   PCV ECHOCARDIOGRAM COMPLETE   Hardening of the aorta (main artery of the heart) (HCC)   Relevant Orders   MYOCARDIAL PERFUSION IMAGING   PCV ECHOCARDIOGRAM COMPLETE     Other   Dizziness   Relevant Orders   MYOCARDIAL PERFUSION IMAGING   PCV ECHOCARDIOGRAM COMPLETE   Hx of CABG   Relevant Orders   MYOCARDIAL PERFUSION IMAGING   PCV ECHOCARDIOGRAM COMPLETE       Disposition:   Return in about 4 weeks (around 06/14/2023) for echo, stress test and f/u.    Total time spent: 40 minutes  Signed,  Adrian Blackwater, MD  05/17/2023 10:34 AM    [facility]

## 2023-05-22 DIAGNOSIS — N393 Stress incontinence (female) (male): Secondary | ICD-10-CM | POA: Diagnosis not present

## 2023-05-22 DIAGNOSIS — M1990 Unspecified osteoarthritis, unspecified site: Secondary | ICD-10-CM | POA: Diagnosis not present

## 2023-05-22 DIAGNOSIS — R2681 Unsteadiness on feet: Secondary | ICD-10-CM | POA: Diagnosis not present

## 2023-05-22 DIAGNOSIS — G3184 Mild cognitive impairment, so stated: Secondary | ICD-10-CM | POA: Diagnosis not present

## 2023-05-22 DIAGNOSIS — K5900 Constipation, unspecified: Secondary | ICD-10-CM | POA: Diagnosis not present

## 2023-05-22 DIAGNOSIS — I129 Hypertensive chronic kidney disease with stage 1 through stage 4 chronic kidney disease, or unspecified chronic kidney disease: Secondary | ICD-10-CM | POA: Diagnosis not present

## 2023-05-22 DIAGNOSIS — I2510 Atherosclerotic heart disease of native coronary artery without angina pectoris: Secondary | ICD-10-CM | POA: Diagnosis not present

## 2023-05-22 DIAGNOSIS — K219 Gastro-esophageal reflux disease without esophagitis: Secondary | ICD-10-CM | POA: Diagnosis not present

## 2023-05-22 DIAGNOSIS — N189 Chronic kidney disease, unspecified: Secondary | ICD-10-CM | POA: Diagnosis not present

## 2023-05-22 DIAGNOSIS — J309 Allergic rhinitis, unspecified: Secondary | ICD-10-CM | POA: Diagnosis not present

## 2023-05-22 DIAGNOSIS — F411 Generalized anxiety disorder: Secondary | ICD-10-CM | POA: Diagnosis not present

## 2023-05-22 DIAGNOSIS — E785 Hyperlipidemia, unspecified: Secondary | ICD-10-CM | POA: Diagnosis not present

## 2023-05-25 DIAGNOSIS — R413 Other amnesia: Secondary | ICD-10-CM | POA: Diagnosis not present

## 2023-05-25 DIAGNOSIS — R42 Dizziness and giddiness: Secondary | ICD-10-CM | POA: Diagnosis not present

## 2023-05-25 DIAGNOSIS — G250 Essential tremor: Secondary | ICD-10-CM | POA: Diagnosis not present

## 2023-05-25 DIAGNOSIS — R2689 Other abnormalities of gait and mobility: Secondary | ICD-10-CM | POA: Diagnosis not present

## 2023-05-25 DIAGNOSIS — R296 Repeated falls: Secondary | ICD-10-CM | POA: Diagnosis not present

## 2023-05-26 ENCOUNTER — Ambulatory Visit (INDEPENDENT_AMBULATORY_CARE_PROVIDER_SITE_OTHER): Payer: Medicare HMO

## 2023-05-26 DIAGNOSIS — Z951 Presence of aortocoronary bypass graft: Secondary | ICD-10-CM | POA: Diagnosis not present

## 2023-05-26 DIAGNOSIS — R001 Bradycardia, unspecified: Secondary | ICD-10-CM

## 2023-05-26 DIAGNOSIS — I700 Atherosclerosis of aorta: Secondary | ICD-10-CM

## 2023-05-26 DIAGNOSIS — I25712 Atherosclerosis of autologous vein coronary artery bypass graft(s) with refractory angina pectoris: Secondary | ICD-10-CM | POA: Diagnosis not present

## 2023-05-26 DIAGNOSIS — R42 Dizziness and giddiness: Secondary | ICD-10-CM

## 2023-05-26 DIAGNOSIS — I10 Essential (primary) hypertension: Secondary | ICD-10-CM

## 2023-05-26 MED ORDER — TECHNETIUM TC 99M SESTAMIBI GENERIC - CARDIOLITE
10.7000 | Freq: Once | INTRAVENOUS | Status: AC | PRN
Start: 2023-05-26 — End: 2023-05-26
  Administered 2023-05-26: 10.7 via INTRAVENOUS

## 2023-05-26 MED ORDER — TECHNETIUM TC 99M SESTAMIBI GENERIC - CARDIOLITE
34.9000 | Freq: Once | INTRAVENOUS | Status: AC | PRN
Start: 2023-05-26 — End: 2023-05-26
  Administered 2023-05-26: 34.9 via INTRAVENOUS

## 2023-05-27 ENCOUNTER — Ambulatory Visit (INDEPENDENT_AMBULATORY_CARE_PROVIDER_SITE_OTHER): Payer: Medicare HMO

## 2023-05-27 DIAGNOSIS — I340 Nonrheumatic mitral (valve) insufficiency: Secondary | ICD-10-CM | POA: Diagnosis not present

## 2023-05-27 DIAGNOSIS — I361 Nonrheumatic tricuspid (valve) insufficiency: Secondary | ICD-10-CM | POA: Diagnosis not present

## 2023-05-27 DIAGNOSIS — R42 Dizziness and giddiness: Secondary | ICD-10-CM

## 2023-05-27 DIAGNOSIS — Z951 Presence of aortocoronary bypass graft: Secondary | ICD-10-CM

## 2023-05-27 DIAGNOSIS — I351 Nonrheumatic aortic (valve) insufficiency: Secondary | ICD-10-CM

## 2023-05-27 DIAGNOSIS — I10 Essential (primary) hypertension: Secondary | ICD-10-CM

## 2023-05-27 DIAGNOSIS — R001 Bradycardia, unspecified: Secondary | ICD-10-CM

## 2023-05-27 DIAGNOSIS — I700 Atherosclerosis of aorta: Secondary | ICD-10-CM

## 2023-05-27 DIAGNOSIS — I25712 Atherosclerosis of autologous vein coronary artery bypass graft(s) with refractory angina pectoris: Secondary | ICD-10-CM

## 2023-06-01 ENCOUNTER — Encounter: Payer: Self-pay | Admitting: Dermatology

## 2023-06-01 ENCOUNTER — Encounter: Payer: Self-pay | Admitting: Cardiovascular Disease

## 2023-06-01 ENCOUNTER — Ambulatory Visit: Payer: Medicare HMO | Admitting: Cardiovascular Disease

## 2023-06-01 ENCOUNTER — Ambulatory Visit: Payer: Medicare HMO | Admitting: Dermatology

## 2023-06-01 VITALS — BP 158/88

## 2023-06-01 VITALS — BP 110/75 | HR 52 | Ht 69.0 in | Wt 188.4 lb

## 2023-06-01 DIAGNOSIS — I340 Nonrheumatic mitral (valve) insufficiency: Secondary | ICD-10-CM

## 2023-06-01 DIAGNOSIS — D692 Other nonthrombocytopenic purpura: Secondary | ICD-10-CM | POA: Diagnosis not present

## 2023-06-01 DIAGNOSIS — L821 Other seborrheic keratosis: Secondary | ICD-10-CM | POA: Diagnosis not present

## 2023-06-01 DIAGNOSIS — Z79899 Other long term (current) drug therapy: Secondary | ICD-10-CM

## 2023-06-01 DIAGNOSIS — R001 Bradycardia, unspecified: Secondary | ICD-10-CM | POA: Diagnosis not present

## 2023-06-01 DIAGNOSIS — Z7189 Other specified counseling: Secondary | ICD-10-CM

## 2023-06-01 DIAGNOSIS — I25810 Atherosclerosis of coronary artery bypass graft(s) without angina pectoris: Secondary | ICD-10-CM | POA: Diagnosis not present

## 2023-06-01 DIAGNOSIS — Z1283 Encounter for screening for malignant neoplasm of skin: Secondary | ICD-10-CM | POA: Diagnosis not present

## 2023-06-01 DIAGNOSIS — Z85820 Personal history of malignant melanoma of skin: Secondary | ICD-10-CM

## 2023-06-01 DIAGNOSIS — L570 Actinic keratosis: Secondary | ICD-10-CM

## 2023-06-01 DIAGNOSIS — W908XXA Exposure to other nonionizing radiation, initial encounter: Secondary | ICD-10-CM

## 2023-06-01 DIAGNOSIS — D1801 Hemangioma of skin and subcutaneous tissue: Secondary | ICD-10-CM

## 2023-06-01 DIAGNOSIS — G4733 Obstructive sleep apnea (adult) (pediatric): Secondary | ICD-10-CM

## 2023-06-01 DIAGNOSIS — I351 Nonrheumatic aortic (valve) insufficiency: Secondary | ICD-10-CM | POA: Diagnosis not present

## 2023-06-01 DIAGNOSIS — D229 Melanocytic nevi, unspecified: Secondary | ICD-10-CM

## 2023-06-01 DIAGNOSIS — Z951 Presence of aortocoronary bypass graft: Secondary | ICD-10-CM | POA: Diagnosis not present

## 2023-06-01 DIAGNOSIS — Z5111 Encounter for antineoplastic chemotherapy: Secondary | ICD-10-CM | POA: Diagnosis not present

## 2023-06-01 DIAGNOSIS — Z872 Personal history of diseases of the skin and subcutaneous tissue: Secondary | ICD-10-CM

## 2023-06-01 DIAGNOSIS — Z85828 Personal history of other malignant neoplasm of skin: Secondary | ICD-10-CM | POA: Diagnosis not present

## 2023-06-01 DIAGNOSIS — L578 Other skin changes due to chronic exposure to nonionizing radiation: Secondary | ICD-10-CM | POA: Diagnosis not present

## 2023-06-01 DIAGNOSIS — I10 Essential (primary) hypertension: Secondary | ICD-10-CM | POA: Diagnosis not present

## 2023-06-01 DIAGNOSIS — L814 Other melanin hyperpigmentation: Secondary | ICD-10-CM | POA: Diagnosis not present

## 2023-06-01 DIAGNOSIS — Z86006 Personal history of melanoma in-situ: Secondary | ICD-10-CM

## 2023-06-01 MED ORDER — ASPIRIN 81 MG PO TBEC
81.0000 mg | DELAYED_RELEASE_TABLET | Freq: Every day | ORAL | 12 refills | Status: DC
Start: 2023-06-01 — End: 2024-05-05

## 2023-06-01 MED ORDER — IMIQUIMOD 5 % EX CREA: TOPICAL_CREAM | CUTANEOUS | 1 refills | Status: DC

## 2023-06-01 NOTE — Progress Notes (Signed)
Follow-Up Visit   Subjective  James [name] is a 87 y.o. male who presents for the following: Skin Cancer Screening and Full Body Skin Exam, Melanoma IS recheck, hx of BCC, hx of AKs  The patient presents for Total-Body Skin Exam (TBSE) for skin cancer screening and mole check. The patient has spots, moles and lesions to be evaluated, some may be new or changing and the patient may have concern these could be cancer.    The following portions of the chart were reviewed this encounter and updated as appropriate: medications, allergies, medical history  Review of Systems:  No other skin or systemic complaints except as noted in HPI or Assessment and Plan.  Objective  Well appearing patient in no apparent distress; mood and affect are within normal limits.  A full examination was performed including scalp, head, eyes, ears, nose, lips, neck, chest, axillae, abdomen, back, buttocks, bilateral upper extremities, bilateral lower extremities, hands, feet, fingers, toes, fingernails, and toenails. All findings within normal limits unless otherwise noted below.   Relevant physical exam findings are noted in the Assessment and Plan.  forehead, scalp x 10 (10) Pink scaly macules    Assessment & Plan   SKIN CANCER SCREENING PERFORMED TODAY.  ACTINIC DAMAGE - Chronic condition, secondary to cumulative UV/sun exposure - diffuse scaly erythematous macules with underlying dyspigmentation - Recommend daily broad spectrum sunscreen SPF 30+ to sun-exposed areas, reapply every 2 hours as needed.  - Staying in the shade or wearing long sleeves, sun glasses (UVA+UVB protection) and wide brim hats (4-inch brim around the entire circumference of the hat) are also recommended for sun protection.  - Call for new or changing lesions.  LENTIGINES, SEBORRHEIC KERATOSES, HEMANGIOMAS - Benign normal skin lesions - Benign-appearing - Call for any changes  MELANOCYTIC NEVI - Tan-brown and/or  pink-flesh-colored symmetric macules and papules - Benign appearing on exam today - Observation - Call clinic for new or changing moles - Recommend daily use of broad spectrum spf 30+ sunscreen to sun-exposed areas.   MELANOMA IN SITU, lentigo maligna type  R infra orbital, bx 11/11/22 Liquid nitrogen destruction 11/17/22 Imiquimod cream started 12/30/22 used for ~6wks Due to the low aggressive nature of these lentigo maligna types, and the patient's age, and the size of the lesion which would require a large area of the face to be removed, the patient decided on liquid nitrogen destruction followed by topical chemotherapy as a treatment of choice.  He understands he may need further surgical procedure if this treatment does not work.  Exam: R infra orbital clear today, no lymphadenopathy  Treatment Plan: Restart Imiquimod 5% cream hs 5d/wk for 6 wks (advised pt if he does not get to irriated from the Imiquimod he can cont 5n/wk until he returns to clinic for 51m f/u)  HISTORY OF BASAL CELL CARCINOMA OF THE SKIN - No evidence of recurrence today - Recommend regular full body skin exams - Recommend daily broad spectrum sunscreen SPF 30+ to sun-exposed areas, reapply every 2 hours as needed.  - Call if any new or changing lesions are noted between office visits   AK (actinic keratosis) (10) forehead, scalp x 10  Actinic keratoses are precancerous spots that appear secondary to cumulative UV radiation exposure/sun exposure over time. They are chronic with expected duration over 1 year. A portion of actinic keratoses will progress to squamous cell carcinoma of the skin. It is not possible to reliably predict which spots will progress to skin cancer and  so treatment is recommended to prevent development of skin cancer.  Recommend daily broad spectrum sunscreen SPF 30+ to sun-exposed areas, reapply every 2 hours as needed.  Recommend staying in the shade or wearing long sleeves, sun glasses  (UVA+UVB protection) and wide brim hats (4-inch brim around the entire circumference of the hat). Call for new or changing lesions.  Destruction of lesion - forehead, scalp x 10 (10) Complexity: simple   Destruction method: cryotherapy   Informed consent: discussed and consent obtained   Timeout:  patient name, date of birth, surgical site, and procedure verified Lesion destroyed using liquid nitrogen: Yes   Region frozen until ice ball extended beyond lesion: Yes   Outcome: patient tolerated procedure well with no complications   Post-procedure details: wound care instructions given     Purpura - Chronic; persistent and recurrent.  Treatable, but not curable. - Violaceous macules and patches - Benign - Related to trauma, age, sun damage and/or use of blood thinners, chronic use of topical and/or oral steroids - Observe - Can use OTC arnica containing moisturizer such as Dermend Bruise Formula if desired - Call for worsening or other concerns  - arms  Return in about 3 months (around 08/31/2023) for TBSE, Hx of Melanoma IS, Hx of BCC, Hx of AKs.  I, Ardis Rowan, RMA, am acting as scribe for Armida Sans, MD .  Documentation: I have reviewed the above documentation for accuracy and completeness, and I agree with the above.  Armida Sans, MD

## 2023-06-01 NOTE — Patient Instructions (Addendum)
Cryotherapy Aftercare  Wash gently with soap and water everyday.   Apply Vaseline and Band-Aid daily until healed.    For Melanoma In Situ on the right infra orbital Restart Imiquimod 5% cream 5 nights a week for at least 6 weeks, if area does not get to irritated you can use until you come back for your 3 month follow up.   Due to recent changes in healthcare laws, you may see results of your pathology and/or laboratory studies on MyChart before the doctors have had a chance to review them. We understand that in some cases there may be results that are confusing or concerning to you. Please understand that not all results are received at the same time and often the doctors may need to interpret multiple results in order to provide you with the best plan of care or course of treatment. Therefore, we ask that you please give [place] 2 business days to thoroughly review all your results before contacting the office for clarification. Should we see a critical lab result, you will be contacted sooner.   If You Need Anything After Your Visit  If you have any questions or concerns for your doctor, please call our main line at [phone] and press option 4 to reach your doctor's medical assistant. If no one answers, please leave a voicemail as directed and we will return your call as soon as possible. Messages left after 4 pm will be answered the following business day.   You may also send [place] a message via MyChart. We typically respond to MyChart messages within 1-2 business days.  For prescription refills, please ask your pharmacy to contact our office. Our fax number is [phone].  If you have an urgent issue when the clinic is closed that cannot wait until the next business day, you can page your doctor at the number below.    Please note that while we do our best to be available for urgent issues outside of office hours, we are not available 24/7.   If you have an urgent issue and are unable to  reach [place], you may choose to seek medical care at your doctor's office, retail clinic, urgent care center, or emergency room.  If you have a medical emergency, please immediately call 911 or go to the emergency department.  Pager Numbers  - Dr. Gwen Pounds: [phone]  - Dr. Roseanne Reno: [phone]  - Dr. Katrinka Blazing: [phone]   In the event of inclement weather, please call our main line at [phone] for an update on the status of any delays or closures.  Dermatology Medication Tips: Please keep the boxes that topical medications come in in order to help keep track of the instructions about where and how to use these. Pharmacies typically print the medication instructions only on the boxes and not directly on the medication tubes.   If your medication is too expensive, please contact our office at [phone] option 4 or send [place] a message through MyChart.   We are unable to tell what your co-pay for medications will be in advance as this is different depending on your insurance coverage. However, we may be able to find a substitute medication at lower cost or fill out paperwork to get insurance to cover a needed medication.   If a prior authorization is required to get your medication covered by your insurance company, please allow [place] 1-2 business days to complete this process.  Drug prices often vary depending on where the prescription is filled and  some pharmacies may offer cheaper prices.  The website www.goodrx.com contains coupons for medications through different pharmacies. The prices here do not account for what the cost may be with help from insurance (it may be cheaper with your insurance), but the website can give you the price if you did not use any insurance.  - You can print the associated coupon and take it with your prescription to the pharmacy.  - You may also stop by our office during regular business hours and pick up a GoodRx coupon card.  - If you need your  prescription sent electronically to a different pharmacy, notify our office through [facility] or by phone at [phone] option 4.     Si Usted Necesita Algo Despus de Su Visita  Tambin puede enviarnos un mensaje a travs de Clinical cytogeneticist. Por lo general respondemos a los mensajes de MyChart en el transcurso de 1 a 2 das hbiles.  Para renovar recetas, por favor pida a su farmacia que se ponga en contacto con nuestra oficina. [name] de fax es [place] [phone].  Si tiene un asunto urgente cuando la clnica est cerrada y que no puede esperar hasta el siguiente da hbil, puede llamar/localizar a su doctor(a) al nmero que aparece a continuacin.   Por favor, tenga en cuenta que aunque hacemos todo lo posible para estar disponibles para asuntos urgentes fuera del horario de [place], no estamos disponibles las 24 horas del da, los 7 [address]  [address] de la [place].   Si tiene un problema urgente y no puede comunicarse con nosotros, puede optar por buscar atencin mdica  en el consultorio de su doctor(a), en una clnica privada, en un centro de atencin urgente o en una sala de emergencias.  Si tiene Engineer, drilling, por favor llame inmediatamente al 911 o vaya a la sala de emergencias.  Nmeros de bper  - Dr. Gwen Pounds: [phone]  - Dra. Roseanne Reno: [phone]  - Dr. Katrinka Blazing: [phone]   En caso de inclemencias del tiempo, por favor llame a Lacy Duverney principal al [phone] para una actualizacin sobre el [place] de cualquier retraso o cierre.  Consejos para la medicacin en dermatologa: Por favor, guarde las cajas en las que vienen los medicamentos de uso tpico para ayudarle a seguir las instrucciones sobre dnde y cmo usarlos. Las farmacias generalmente imprimen las instrucciones del medicamento slo en las cajas y no directamente en los tubos del [place].   Si su medicamento es muy caro, por favor, pngase en contacto con Rolm Gala llamando al  [phone] y presione la opcin 4 o envenos un mensaje a travs de Clinical cytogeneticist.   No podemos decirle cul ser su copago por los medicamentos por adelantado ya que esto es diferente dependiendo de la cobertura de su seguro. Sin embargo, es posible que podamos encontrar un medicamento sustituto a Audiological scientist un formulario para que el seguro cubra el medicamento que se considera necesario.   Si se requiere una autorizacin previa para que su compaa de seguros [place] su medicamento, por favor permtanos de 1 a 2 das hbiles para completar [address]  Los precios de los medicamentos varan con frecuencia dependiendo del Environmental consultant de dnde se surte la receta y alguna farmacias pueden ofrecer precios ms baratos.  El sitio web www.goodrx.com tiene cupones para medicamentos de Health and safety inspector. Los precios aqu no tienen en cuenta lo que podra costar con la ayuda del seguro (puede ser ms barato con su seguro), pero el sitio web puede darle el  precio si no Visual merchandiser.  - Puede imprimir el cupn correspondiente y llevarlo con su receta a la farmacia.  - Tambin puede pasar por nuestra oficina durante el horario de atencin regular y Education officer, museum una tarjeta de cupones de GoodRx.  - Si necesita que su receta se enve electrnicamente a una farmacia diferente, informe a nuestra oficina a travs de MyChart de [place] o por telfono llamando al [phone] y presione la opcin 4.

## 2023-06-01 NOTE — Progress Notes (Signed)
Cardiology Office Note   Date:  06/01/2023   ID:  James [name], DOB Jan 22, 1936, MRN [id number]  PCP:  Margaretann Loveless, MD  Cardiologist:  Adrian Blackwater, MD      History of Present Illness: James [name] is a 87 y.o. male who presents for  Chief Complaint  Patient presents with   Follow-up    NST& ECHO results    Doing well      Past Medical History:  Diagnosis Date   Anxiety    Aortic atherosclerosis (HCC)    Aortic dilatation (HCC) 01/10/2021   a.) CT 01/10/2021: infrarenal aortic dilitation measuring 3.3 x 3.3 cm. b.) CT 06/23/2021: measured 3.4 cm.   Arthritis    B12 deficiency    Basal cell carcinoma 03/14/2019   R nasal tip    Bilateral renal cysts    BPH (benign prostatic hyperplasia)    Bradycardia    Chronic kidney disease    COPD (chronic obstructive pulmonary disease) (HCC)    Coronary artery disease    a.) s/p 2v CABG in 2001; performed while living in [facility] [place].   Diastolic dysfunction 06/2020   a.) TTE 06/2020: EF 60%; sev LA dilation, mild RA dilation; triv PR, mild to mod TR/MR/AR; G1DD.   Frequency of urination    GERD (gastroesophageal reflux disease)    Hiatal hernia    History of kidney stones    HLD (hyperlipidemia)    Hypertension    Internal hemorrhoids    Long term current use of anticoagulant    a.) Rivaroxaban   Melanoma (HCC) 11/11/2022   right infra orbital - MMIS discuss tx options   Mobitz type 1 second degree AV block    OSA (obstructive sleep apnea)    a.) does not require nocturnal PAP therapy   Osteopenia    Pulmonary HTN (HCC) 06/2020   a.) mild   S/P CABG x 2 2001   a.) details unknown; performed in 2001 in [place].     Past Surgical History:  Procedure Laterality Date   CARDIAC CATHETERIZATION     COLONOSCOPY     CORONARY ARTERY BYPASS GRAFT     CYSTOSCOPY/URETEROSCOPY/HOLMIUM LASER/STENT PLACEMENT Right 11/07/2021   Procedure: CYSTOSCOPY/URETEROSCOPY/HOLMIUM LASER/STENT PLACEMENT;   Surgeon: Sondra Come, MD;  Location: [facility] ORS;  Service: Urology;  Laterality: Right;   FRACTURE SURGERY Left    fractured left arm   HOLEP-LASER ENUCLEATION OF THE PROSTATE WITH MORCELLATION N/A 11/07/2021   Procedure: HOLEP-LASER ENUCLEATION OF THE PROSTATE WITH MORCELLATION;  Surgeon: Sondra Come, MD;  Location: [facility] ORS;  Service: Urology;  Laterality: N/A;   LIPOMA EXCISION N/A 11/21/2018   Procedure: EXCISION BACK LIPOMA;  Surgeon: Griselda Miner, MD;  Location: [facility] OR;  Service: General;  Laterality: N/A;   PILONIDAL CYST EXCISION     SKIN CANCER EXCISION     TOTAL SHOULDER REPLACEMENT Left    UMBILICAL HERNIA REPAIR N/A 11/21/2018   Procedure: HERNIA REPAIR UMBILICAL ADULT WITH MESH;  Surgeon: Griselda Miner, MD;  Location: [facility] OR;  Service: General;  Laterality: N/A;     Current Outpatient Medications  Medication Sig Dispense Refill   aspirin EC 81 MG tablet Take 1 tablet (81 mg total) by mouth daily. Swallow whole. 30 tablet 12   citalopram (CELEXA) 20 MG tablet Take 1 tablet (20 mg total) by mouth daily. (Patient taking differently: Take 40 mg by mouth daily.) 90 tablet 3   Coenzyme Q10 (CO Q-10 MAXIMUM  STRENGTH PO) Take 1 capsule by mouth daily. MAX Q 10     enalapril (VASOTEC) 20 MG tablet TAKE 1 TABLET BY MOUTH EVERY DAY 90 tablet 0   Ferrous Sulfate Dried (SLOW RELEASE IRON) 45 MG TBCR Take 45 mg by mouth daily.     fluticasone (FLONASE) 50 MCG/ACT nasal spray Place 2 sprays into both nostrils daily as needed for allergies.     GEMTESA 75 MG TABS Take 1 tablet by mouth daily.     imiquimod (ALDARA) 5 % cream Apply topically as directed. Apply to area on right cheek 5 nights a week for 6 weeks 24 each 1   ondansetron (ZOFRAN) 4 MG tablet Take 1 tablet (4 mg total) by mouth every 8 (eight) hours as needed for nausea or vomiting. 30 tablet 0   pantoprazole (PROTONIX) 40 MG tablet Take 1 tablet (40 mg total) by mouth daily. 90 tablet 3   rosuvastatin (CRESTOR) 10 MG  tablet Take 10 mg by mouth daily.     vitamin B-12 (CYANOCOBALAMIN) 500 MCG tablet      No current facility-administered medications for this visit.    Allergies:   Patient has no known allergies.    Social History:   reports that he quit smoking about 9 years ago. His smoking use included cigarettes. He started smoking about 29 years ago. He has a 20 pack-year smoking history. He has been exposed to tobacco smoke. He has never used smokeless tobacco. He reports current alcohol use. He reports that he does not use drugs.   Family History:  family history is not on file.    ROS:     Review of Systems  Constitutional: Negative.   HENT: Negative.    Eyes: Negative.   Respiratory: Negative.    Gastrointestinal: Negative.   Genitourinary: Negative.   Musculoskeletal: Negative.   Skin: Negative.   Neurological: Negative.   Endo/Heme/Allergies: Negative.   Psychiatric/Behavioral: Negative.    All other systems reviewed and are negative.     All other systems are reviewed and negative.    PHYSICAL EXAM: VS:  BP 110/75   Pulse (!) 52   Ht 5\' 9"  (1.753 m)   Wt 188 lb 6.4 oz (85.5 kg)   SpO2 99%   BMI 27.82 kg/m  , BMI Body mass index is 27.82 kg/m. Last weight:  Wt Readings from Last 3 Encounters:  06/01/23 188 lb 6.4 oz (85.5 kg)  05/17/23 185 lb 3.2 oz (84 kg)  05/14/23 185 lb 3.2 oz (84 kg)     Physical Exam Vitals reviewed.  Constitutional:      Appearance: Normal appearance. He is normal weight.  HENT:     Head: Normocephalic.     Nose: Nose normal.     Mouth/Throat:     Mouth: Mucous membranes are moist.  Eyes:     Pupils: Pupils are equal, round, and reactive to light.  Cardiovascular:     Rate and Rhythm: Normal rate and regular rhythm.     Pulses: Normal pulses.     Heart sounds: Normal heart sounds.  Pulmonary:     Effort: Pulmonary effort is normal.  Abdominal:     General: Abdomen is flat. Bowel sounds are normal.  Musculoskeletal:         General: Normal range of motion.     Cervical back: Normal range of motion.  Skin:    General: Skin is warm.  Neurological:     General: No focal deficit  present.     Mental Status: He is alert.  Psychiatric:        Mood and Affect: Mood normal.       EKG:   Recent Labs: 05/07/2023: ALT 17; BUN 24; Creatinine, Ser 1.04; Hemoglobin 13.8; Platelets 152; Potassium 4.3; Sodium 143    Lipid Panel    Component Value Date/Time   CHOL 106 05/07/2023 1224   TRIG 46 05/07/2023 1224   HDL 62 05/07/2023 1224   CHOLHDL 1.9 11/11/2022 1115   LDLCALC 32 05/07/2023 1224      Other studies Reviewed: Additional studies/ records that were reviewed today include:  Review of the above records demonstrates:       No data to display            ASSESSMENT AND PLAN:    ICD-10-CM   1. Essential hypertension, benign  I10 aspirin EC 81 MG tablet    2. Coronary atherosclerosis of autologous vein bypass graft without angina  I25.810 aspirin EC 81 MG tablet   Restart asp 81, as stopped it for some reason. CCTA had grafts patent    3. Bradycardia, sinus  R00.1 aspirin EC 81 MG tablet    4. Obstructive sleep apnea (adult) (pediatric)  G47.33 aspirin EC 81 MG tablet    5. Hx of CABG  Z95.1 aspirin EC 81 MG tablet    6. Nonrheumatic mitral valve regurgitation  I34.0    mild    7. Nonrheumatic aortic valve insufficiency  I35.1    mild       Problem List Items Addressed This Visit       Cardiovascular and Mediastinum   Bradycardia, sinus   Relevant Medications   aspirin EC 81 MG tablet   Coronary atherosclerosis of autologous vein bypass graft   Relevant Medications   aspirin EC 81 MG tablet   Essential hypertension, benign - Primary   Relevant Medications   aspirin EC 81 MG tablet     Respiratory   Obstructive sleep apnea (adult) (pediatric)   Relevant Medications   aspirin EC 81 MG tablet     Other   Hx of CABG   Relevant Medications   aspirin EC 81 MG tablet    Other Visit Diagnoses     Nonrheumatic mitral valve regurgitation       mild   Relevant Medications   aspirin EC 81 MG tablet   Nonrheumatic aortic valve insufficiency       mild   Relevant Medications   aspirin EC 81 MG tablet          Disposition:   Return in about 3 months (around 08/31/2023).    Total time spent: 30 minutes  Signed,  Adrian Blackwater, MD  06/01/2023 10:58 AM    [facility]

## 2023-06-22 ENCOUNTER — Ambulatory Visit: Payer: Medicare HMO | Attending: Neurology | Admitting: Physical Therapy

## 2023-06-22 DIAGNOSIS — R2689 Other abnormalities of gait and mobility: Secondary | ICD-10-CM

## 2023-06-22 DIAGNOSIS — R278 Other lack of coordination: Secondary | ICD-10-CM | POA: Diagnosis not present

## 2023-06-22 DIAGNOSIS — R269 Unspecified abnormalities of gait and mobility: Secondary | ICD-10-CM

## 2023-06-22 DIAGNOSIS — M6281 Muscle weakness (generalized): Secondary | ICD-10-CM

## 2023-06-22 DIAGNOSIS — R262 Difficulty in walking, not elsewhere classified: Secondary | ICD-10-CM

## 2023-06-22 NOTE — Therapy (Signed)
OUTPATIENT PHYSICAL THERAPY NEURO EVALUATION   Patient Name: James [name] MRN: [id number] DOB:Jan 13, 1936, 87 y.o., male Today's Date: 06/22/2023   PCP: Margaretann Loveless, MD  REFERRING PROVIDER: Morene Crocker, MD   END OF SESSION:  PT End of Session - 06/22/23 1201     Visit Number 1    Number of Visits 16    Date for PT Re-Evaluation 08/17/23    Progress Note Due on Visit 10    PT Start Time 1020    PT Stop Time 1100    PT Time Calculation (min) 40 min    Equipment Utilized During Treatment Gait belt    Activity Tolerance Patient tolerated treatment well;No increased pain    Behavior During Therapy [facility] for tasks assessed/performed             Past Medical History:  Diagnosis Date   Anxiety    Aortic atherosclerosis (HCC)    Aortic dilatation (HCC) 01/10/2021   a.) CT 01/10/2021: infrarenal aortic dilitation measuring 3.3 x 3.3 cm. b.) CT 06/23/2021: measured 3.4 cm.   Arthritis    B12 deficiency    Basal cell carcinoma 03/14/2019   R nasal tip    Bilateral renal cysts    BPH (benign prostatic hyperplasia)    Bradycardia    Chronic kidney disease    COPD (chronic obstructive pulmonary disease) (HCC)    Coronary artery disease    a.) s/p 2v CABG in 2001; performed while living in [facility] [place].   Diastolic dysfunction 06/2020   a.) TTE 06/2020: EF 60%; sev LA dilation, mild RA dilation; triv PR, mild to mod TR/MR/AR; G1DD.   Frequency of urination    GERD (gastroesophageal reflux disease)    Hiatal hernia    History of kidney stones    HLD (hyperlipidemia)    Hypertension    Internal hemorrhoids    Long term current use of anticoagulant    a.) Rivaroxaban   Melanoma (HCC) 11/11/2022   right infra orbital - MMIS discuss tx options   Mobitz type 1 second degree AV block    OSA (obstructive sleep apnea)    a.) does not require nocturnal PAP therapy   Osteopenia    Pulmonary HTN (HCC) 06/2020   a.) mild   S/P CABG x 2 2001   a.) details  unknown; performed in 2001 in [place].   Past Surgical History:  Procedure Laterality Date   CARDIAC CATHETERIZATION     COLONOSCOPY     CORONARY ARTERY BYPASS GRAFT     CYSTOSCOPY/URETEROSCOPY/HOLMIUM LASER/STENT PLACEMENT Right 11/07/2021   Procedure: CYSTOSCOPY/URETEROSCOPY/HOLMIUM LASER/STENT PLACEMENT;  Surgeon: Sondra Come, MD;  Location: [facility] ORS;  Service: Urology;  Laterality: Right;   FRACTURE SURGERY Left    fractured left arm   HOLEP-LASER ENUCLEATION OF THE PROSTATE WITH MORCELLATION N/A 11/07/2021   Procedure: HOLEP-LASER ENUCLEATION OF THE PROSTATE WITH MORCELLATION;  Surgeon: Sondra Come, MD;  Location: [facility] ORS;  Service: Urology;  Laterality: N/A;   LIPOMA EXCISION N/A 11/21/2018   Procedure: EXCISION BACK LIPOMA;  Surgeon: Griselda Miner, MD;  Location: [facility] OR;  Service: General;  Laterality: N/A;   PILONIDAL CYST EXCISION     SKIN CANCER EXCISION     TOTAL SHOULDER REPLACEMENT Left    UMBILICAL HERNIA REPAIR N/A 11/21/2018   Procedure: HERNIA REPAIR UMBILICAL ADULT WITH MESH;  Surgeon: Griselda Miner, MD;  Location: [facility] OR;  Service: General;  Laterality: N/A;   Patient Active Problem List  Diagnosis Date Noted   Gastroesophageal reflux disease without esophagitis 05/07/2023   GAD (generalized anxiety disorder) 02/08/2023   Unsteady gait when walking 11/09/2022   BPH with obstruction/lower urinary tract symptoms 03/21/2020   Nephrolithiasis 03/21/2020   Bradycardia, sinus 03/16/2019   Dizziness 03/16/2019   Essential hypertension, benign 03/16/2019   Renal cyst 06/24/2018   Hiatal hernia 06/13/2018   Coronary atherosclerosis of autologous vein bypass graft 06/22/2017   Vitamin B12 deficiency 12/30/2016   Vitamin D deficiency 12/28/2016   Family history of malignant neoplasm of other organs or systems 12/08/2016   Obstructive sleep apnea (adult) (pediatric) 01/01/2016   Overweight 12/16/2015   Diverticulosis of colon 11/01/2015   Internal  hemorrhoid 11/01/2015   Senile ectropion of left lower eyelid 06/28/2014   Cherry angioma 02/22/2014   Lentigo 02/22/2014   Actinic keratosis 02/22/2014   Not current smoker 05/23/2013   Personal history of other malignant neoplasm of skin 05/23/2013   Hardening of the aorta (main artery of the heart) (HCC) 07/17/2012   Anxiety 06/15/2012   COPD (chronic obstructive pulmonary disease) (HCC) 06/15/2012   Neck mass 10/17/2011   Anemia 08/06/2011   Closed fracture of part of upper end of humerus 08/03/2011   Hx of CABG 01/22/2011   Hyperlipidemia 01/21/2011   Osteopenia 05/01/2010   Dyspepsia 03/08/2010   Colon polyp 05/09/2009   Tobacco abuse counseling 11/11/2007    ONSET DATE: 02/04/23  REFERRING DIAG: R26.81 (ICD-10-CM) - Unsteady gait   THERAPY DIAG:  Abnormality of gait and mobility  Difficulty in walking, not elsewhere classified  Muscle weakness (generalized)  Other abnormalities of gait and mobility  Rationale for Evaluation and Treatment: Rehabilitation  SUBJECTIVE:                                                                                                                                                                                             SUBJECTIVE STATEMENT: Pt reports he feels his main problem is with his left leg. Many years back he fractured it and was in a cast for a long time. Pt reports he has a fear of falling, he works on his balance doing exercises performing exercises the neurologist gave him. Pt took a bad fall in his house and ended up with 5 staples in his head. He reports the good thing was his IQ was better [name]). Pt was going to the gym regularly and goes to the gym. Pt just started and goes for 1-1.5 hours at a time.  Pt accompanied by: self  PERTINENT HISTORY: James [name] is a 87 y.o. male past medical history of hypertension,  arthritis, chronic kidney disease, COPD, diastolic dysfunction . Pt reports he feels his main  problem is with his left leg. Many years back he fractured it and was in a cast for a long time. Pt reports he has a fear of falling, he works on his balance doing exercises performing exercises the neurologist gave him. Pt took a bad fall in his house and ended up with 5 staples in his head. He reports the good thing was his IQ was better [name]). Pt was going to the gym regularly and goes to the gym. Pt just started and goes for 1-1.5 hours at a time.   PAIN:  Are you having pain? No  PRECAUTIONS: None  RED FLAGS: None   WEIGHT BEARING RESTRICTIONS: No  FALLS: Has patient fallen in last 6 months? Yes. Number of falls 2  LIVING ENVIRONMENT: Lives with: lives alone Lives in: House/apartment Stairs:  elevator but has stairs for exercise  Has following equipment at home: Single point cane, shower chair, and Grab bars  PLOF: Independent and Independent with basic ADLs  PATIENT GOALS: Improve strength and balance  OBJECTIVE:  Note: Objective measures were completed at Evaluation unless otherwise noted.  DIAGNOSTIC FINDINGS: No significant radiological findings in the chart pertaining to this particular problem  COGNITION: Overall cognitive status: Within functional limits for tasks assessed   SENSATION: WFL   POSTURE:  In standing and walking patient has posterior weight shift  LOWER EXTREMITY ROM:     WNL  LOWER EXTREMITY MMT:    MMT Right Eval Left Eval  Hip flexion 4+ 4+  Hip extension    Hip abduction 4+ 4+  Hip adduction 4+ 4+  Hip internal rotation    Hip external rotation    Knee flexion 4+ 4+  Knee extension 4+ 4+  Ankle dorsiflexion    Ankle plantarflexion    Ankle inversion    Ankle eversion    (Blank rows = not tested) All tested in seated position  TRANSFERS: Assistive device utilized: None  Sit to stand: Modified independence Stand to sit: Complete Independence Chair to chair: Complete Independence     CURB:  Comments:  Difficulty  with stepping over objects likely causing difficulty with curb navigation with stepping over object with his left lower extremity patient displays loss of balance and makes contact with shoe box.  Likely similar result if attempting a curb of a similar height Assistive device utilized: None   STAIRS: Level of Assistance: Modified independence Stair Negotiation Technique: Alternating Pattern  with Bilateral Rails Number of Stairs: 4  Height of Stairs: 6 in    GAIT: Gait pattern:  Slight posterior weight shift throughout ambulation.  Difficulty with performing several tasks of DGI see DGI for more specific results Distance walked: 10 m  FUNCTIONAL TESTS:  5 times sit to stand: 17.3 6 minute walk test: test visit 2  10 meter walk test: .75 m/s Dynamic Gait Index: see below   [facility] PT Assessment - 06/22/23 0001       Standardized Balance Assessment   Standardized Balance Assessment Dynamic Gait Index      Dynamic Gait Index   Level Surface Mild Impairment    Change in Gait Speed Mild Impairment    Gait with Horizontal Head Turns Mild Impairment    Gait with Vertical Head Turns Moderate Impairment    Gait and Pivot Turn Mild Impairment    Step Over Obstacle Severe Impairment    Step Around Obstacles Mild Impairment  Steps Mild Impairment    Total Score 13             PATIENT SURVEYS:  FOTO 51  TODAY'S TREATMENT:                                                                                                                              Eval Only    PATIENT EDUCATION: Education details: POC Person educated: Patient Education method: Explanation Education comprehension: verbalized understanding   HOME EXERCISE PROGRAM: Establish visit 2   GOALS: Goals reviewed with patient? Yes  SHORT TERM GOALS: Target date: 07/13/2023       Patient will be independent in home exercise program to improve strength/mobility for better functional independence with  ADLs. Baseline: No HEP currently  Goal status: INITIAL   LONG TERM GOALS: Target date: 09/14/2023  1.  Patient (> 70 years old) will complete five times sit to stand test in < 15 seconds indicating an increased LE strength and improved balance. Baseline: 17.3 sec Goal status: INITIAL  2.  Patient will increase FOTO score to equal to or greater than  55   to demonstrate statistically significant improvement in mobility and quality of life.  Baseline: 51 Goal status: INITIAL   3.  Patient will increase Berg Balance score by > 6 points to demonstrate decreased fall risk during functional activities. Baseline: Test visit two Goal status: INITIAL   4.   Patient will improve dynamic gait index to 19 or greater to reduce fall risk and demonstrate improved transfer/gait ability. Baseline: 13 Goal status: INITIAL  5.   Patient will increase 10 meter walk test to >1.37m/s as to improve gait speed for better community ambulation and to reduce fall risk. Baseline: .75 m/s Goal status: INITIAL  6.   Patient will increase six minute walk test distance to >1000 for progression to community ambulator and improve gait ability Baseline: Test visit #2 Goal status: INITIAL    ASSESSMENT:  CLINICAL IMPRESSION: Patient is a 87 year old-year-old male who presents to physical therapy for evaluation and treatment of balance deficits as well as some weakness in his lower extremities.  Patient has been going to the gym for the past several weeks and completing several exercises for both cardiopulmonary endurance as well as general lower extremity strength utilizing machines in the gym.  Patient displays deficits in his dynamic balance as evidenced by dynamic gait index score being a 13.  Patient also shows decreased ambulation speed as evidenced by 10 m walk test results.  Patient is verbose but is very pleasant.  Will need to assess 6-minute walk test as well as Berg balance score at next visit for  patient's goals and per patient plan of care.  Will begin with some functional strengthening utilizing close chain movements within patient's tolerance.  Will also initiate balance exercises to improve curb and stair navigation as well as the dynamic activities.  Following Berg balance test and 6-minute  walk test may initiate some strengthening and endurance and balance exercises specific to his needs.  Patient will benefit from skilled physical therapy in order to improve his lower extremity strength, balance, functional capacity, and decrease his risk of falls.  OBJECTIVE IMPAIRMENTS: Abnormal gait, decreased activity tolerance, decreased balance, decreased endurance, decreased mobility, difficulty walking, and decreased strength.   ACTIVITY LIMITATIONS: squatting and stairs  PARTICIPATION LIMITATIONS: shopping, community activity, and yard work  PERSONAL FACTORS: Age and 3+ comorbidities: hypertension, arthritis, chronic kidney disease, COPD, diastolic dysfunction  are also affecting patient's functional outcome.   REHAB POTENTIAL: Good  CLINICAL DECISION MAKING: Stable/uncomplicated  EVALUATION COMPLEXITY: Low  PLAN:  PT FREQUENCY: 1-2x/week  PT DURATION: 8 weeks  PLANNED INTERVENTIONS: Therapeutic exercises, Therapeutic activity, Neuromuscular re-education, Balance training, Gait training, Patient/Family education, Self Care, Joint mobilization, Stair training, and Manual therapy  PLAN FOR NEXT SESSION: Berg balance test and 6-minute walk test.  Initiate home exercise program with close chain lower extremity strengthening exercises.  Also may include balance activities on HEP based on results from [place] or patient presentation.   Norman Herrlich, PT 06/22/2023, 12:02 PM

## 2023-06-24 ENCOUNTER — Ambulatory Visit: Payer: Medicare HMO

## 2023-06-24 DIAGNOSIS — R2689 Other abnormalities of gait and mobility: Secondary | ICD-10-CM | POA: Diagnosis not present

## 2023-06-24 DIAGNOSIS — M6281 Muscle weakness (generalized): Secondary | ICD-10-CM

## 2023-06-24 DIAGNOSIS — R269 Unspecified abnormalities of gait and mobility: Secondary | ICD-10-CM | POA: Diagnosis not present

## 2023-06-24 DIAGNOSIS — R262 Difficulty in walking, not elsewhere classified: Secondary | ICD-10-CM | POA: Diagnosis not present

## 2023-06-24 DIAGNOSIS — R278 Other lack of coordination: Secondary | ICD-10-CM

## 2023-06-24 NOTE — Therapy (Signed)
OUTPATIENT PHYSICAL THERAPY NEURO TREATMENT    Patient Name: James [name] MRN: [id number] DOB:1936-03-14, 87 y.o., male Today's Date: 06/25/2023   PCP: Margaretann Loveless, MD  REFERRING PROVIDER: Morene Crocker, MD   END OF SESSION:  PT End of Session - 06/24/23 1500     Visit Number 2    Number of Visits 16    Date for PT Re-Evaluation 08/17/23    Progress Note Due on Visit 10    PT Start Time 1453    PT Stop Time 1530    PT Time Calculation (min) 37 min    Equipment Utilized During Treatment Gait belt    Activity Tolerance Patient tolerated treatment well;No increased pain    Behavior During Therapy [facility] for tasks assessed/performed             Past Medical History:  Diagnosis Date   Anxiety    Aortic atherosclerosis (HCC)    Aortic dilatation (HCC) 01/10/2021   a.) CT 01/10/2021: infrarenal aortic dilitation measuring 3.3 x 3.3 cm. b.) CT 06/23/2021: measured 3.4 cm.   Arthritis    B12 deficiency    Basal cell carcinoma 03/14/2019   R nasal tip    Bilateral renal cysts    BPH (benign prostatic hyperplasia)    Bradycardia    Chronic kidney disease    COPD (chronic obstructive pulmonary disease) (HCC)    Coronary artery disease    a.) s/p 2v CABG in 2001; performed while living in [facility] [place].   Diastolic dysfunction 06/2020   a.) TTE 06/2020: EF 60%; sev LA dilation, mild RA dilation; triv PR, mild to mod TR/MR/AR; G1DD.   Frequency of urination    GERD (gastroesophageal reflux disease)    Hiatal hernia    History of kidney stones    HLD (hyperlipidemia)    Hypertension    Internal hemorrhoids    Long term current use of anticoagulant    a.) Rivaroxaban   Melanoma (HCC) 11/11/2022   right infra orbital - [facility] discuss tx options   Mobitz type 1 second degree AV block    OSA (obstructive sleep apnea)    a.) does not require nocturnal PAP therapy   Osteopenia    Pulmonary HTN (HCC) 06/2020   a.) mild   S/P CABG x 2 2001   a.) details  unknown; performed in 2001 in [place].   Past Surgical History:  Procedure Laterality Date   CARDIAC CATHETERIZATION     COLONOSCOPY     CORONARY ARTERY BYPASS GRAFT     CYSTOSCOPY/URETEROSCOPY/HOLMIUM LASER/STENT PLACEMENT Right 11/07/2021   Procedure: CYSTOSCOPY/URETEROSCOPY/HOLMIUM LASER/STENT PLACEMENT;  Surgeon: Sondra Come, MD;  Location: [facility] ORS;  Service: Urology;  Laterality: Right;   FRACTURE SURGERY Left    fractured left arm   HOLEP-LASER ENUCLEATION OF THE PROSTATE WITH MORCELLATION N/A 11/07/2021   Procedure: HOLEP-LASER ENUCLEATION OF THE PROSTATE WITH MORCELLATION;  Surgeon: Sondra Come, MD;  Location: [facility] ORS;  Service: Urology;  Laterality: N/A;   LIPOMA EXCISION N/A 11/21/2018   Procedure: EXCISION BACK LIPOMA;  Surgeon: Griselda Miner, MD;  Location: [facility] OR;  Service: General;  Laterality: N/A;   PILONIDAL CYST EXCISION     SKIN CANCER EXCISION     TOTAL SHOULDER REPLACEMENT Left    UMBILICAL HERNIA REPAIR N/A 11/21/2018   Procedure: HERNIA REPAIR UMBILICAL ADULT WITH MESH;  Surgeon: Griselda Miner, MD;  Location: [facility] OR;  Service: General;  Laterality: N/A;   Patient Active Problem  List   Diagnosis Date Noted   Gastroesophageal reflux disease without esophagitis 05/07/2023   GAD (generalized anxiety disorder) 02/08/2023   Unsteady gait when walking 11/09/2022   BPH with obstruction/lower urinary tract symptoms 03/21/2020   Nephrolithiasis 03/21/2020   Bradycardia, sinus 03/16/2019   Dizziness 03/16/2019   Essential hypertension, benign 03/16/2019   Renal cyst 06/24/2018   Hiatal hernia 06/13/2018   Coronary atherosclerosis of autologous vein bypass graft 06/22/2017   Vitamin B12 deficiency 12/30/2016   Vitamin D deficiency 12/28/2016   Family history of malignant neoplasm of other organs or systems 12/08/2016   Obstructive sleep apnea (adult) (pediatric) 01/01/2016   Overweight 12/16/2015   Diverticulosis of colon 11/01/2015   Internal  hemorrhoid 11/01/2015   Senile ectropion of left lower eyelid 06/28/2014   Cherry angioma 02/22/2014   Lentigo 02/22/2014   Actinic keratosis 02/22/2014   Not current smoker 05/23/2013   Personal history of other malignant neoplasm of skin 05/23/2013   Hardening of the aorta (main artery of the heart) (HCC) 07/17/2012   Anxiety 06/15/2012   COPD (chronic obstructive pulmonary disease) (HCC) 06/15/2012   Neck mass 10/17/2011   Anemia 08/06/2011   Closed fracture of part of upper end of humerus 08/03/2011   Hx of CABG 01/22/2011   Hyperlipidemia 01/21/2011   Osteopenia 05/01/2010   Dyspepsia 03/08/2010   Colon polyp 05/09/2009   Tobacco abuse counseling 11/11/2007    ONSET DATE: 02/04/23  REFERRING DIAG: R26.81 (ICD-10-CM) - Unsteady gait   THERAPY DIAG:  Abnormality of gait and mobility  Difficulty in walking, not elsewhere classified  Muscle weakness (generalized)  Other abnormalities of gait and mobility  Other lack of coordination  Rationale for Evaluation and Treatment: Rehabilitation  SUBJECTIVE:                                                                                                                                                                                             SUBJECTIVE STATEMENT: Patient reports feeling tired today but otherwise doing well. States already went to gym earlier this morning.    Pt accompanied by: self  PERTINENT HISTORY: James [name] is a 87 y.o. male past medical history of hypertension, arthritis, chronic kidney disease, COPD, diastolic dysfunction . Pt reports he feels his main problem is with his left leg. Many years back he fractured it and was in a cast for a long time. Pt reports he has a fear of falling, he works on his balance doing exercises performing exercises the neurologist gave him. Pt took a bad fall in his house and ended up with 5 staples in his head.  He reports the good thing was his IQ was better [name]). Pt was going to the gym regularly and goes to the gym. Pt just started and goes for 1-1.5 hours at a time.   PAIN:  Are you having pain? No  PRECAUTIONS: None  RED FLAGS: None   WEIGHT BEARING RESTRICTIONS: No  FALLS: Has patient fallen in last 6 months? Yes. Number of falls 2  LIVING ENVIRONMENT: Lives with: lives alone Lives in: House/apartment Stairs:  elevator but has stairs for exercise  Has following equipment at home: Single point cane, shower chair, and Grab bars  PLOF: Independent and Independent with basic ADLs  PATIENT GOALS: Improve strength and balance  OBJECTIVE:  Note: Objective measures were completed at Evaluation unless otherwise noted.  DIAGNOSTIC FINDINGS: No significant radiological findings in the chart pertaining to this particular problem  COGNITION: Overall cognitive status: Within functional limits for tasks assessed   SENSATION: WFL   POSTURE:  In standing and walking patient has posterior weight shift  LOWER EXTREMITY ROM:     WNL  LOWER EXTREMITY MMT:    MMT Right Eval Left Eval  Hip flexion 4+ 4+  Hip extension    Hip abduction 4+ 4+  Hip adduction 4+ 4+  Hip internal rotation    Hip external rotation    Knee flexion 4+ 4+  Knee extension 4+ 4+  Ankle dorsiflexion    Ankle plantarflexion    Ankle inversion    Ankle eversion    (Blank rows = not tested) All tested in seated position  TRANSFERS: Assistive device utilized: None  Sit to stand: Modified independence Stand to sit: Complete Independence Chair to chair: Complete Independence     CURB:  Comments:  Difficulty with stepping over objects likely causing difficulty with curb navigation with stepping over object with his left lower extremity patient displays loss of balance and makes contact with shoe box.  Likely similar result if attempting a curb of a similar height Assistive device utilized: None   STAIRS: Level of Assistance: Modified  independence Stair Negotiation Technique: Alternating Pattern  with Bilateral Rails Number of Stairs: 4  Height of Stairs: 6 in    GAIT: Gait pattern:  Slight posterior weight shift throughout ambulation.  Difficulty with performing several tasks of DGI see DGI for more specific results Distance walked: 10 m  FUNCTIONAL TESTS:  5 times sit to stand: 17.3 6 minute walk test: test visit 2  10 meter walk test: .75 m/s Dynamic Gait Index: see below   [facility] PT Assessment - 06/24/23 1511       Standardized Balance Assessment   Standardized Balance Assessment Berg Balance Test      Berg Balance Test   Sit to Stand Able to stand without using hands and stabilize independently    Standing Unsupported Able to stand safely 2 minutes    Sitting with Back Unsupported but Feet Supported on Floor or Stool Able to sit safely and securely 2 minutes    Stand to Sit Sits safely with minimal use of hands    Transfers Able to transfer safely, minor use of hands    Standing Unsupported with Eyes Closed Able to stand 10 seconds safely    Standing Unsupported with Feet Together Able to place feet together independently and stand 1 minute safely    From Standing, Reach Forward with Outstretched Arm Can reach forward >12 cm safely (5")    From Standing Position, Pick up Object from Floor  Able to pick up shoe, needs supervision    From Standing Position, Turn to Look Behind Over each Shoulder Turn sideways only but maintains balance    Turn 360 Degrees Able to turn 360 degrees safely but slowly    Standing Unsupported, Alternately Place Feet on Step/Stool Able to stand independently and safely and complete 8 steps in 20 seconds    Standing Unsupported, One Foot in Front Able to plae foot ahead of the other independently and hold 30 seconds    Standing on One Leg Able to lift leg independently and hold equal to or more than 3 seconds    Total Score 47              PATIENT SURVEYS:  FOTO  51  TODAY'S TREATMENT:                                                                                                                               BERG= 47/56 6 Min Walk Test:  Instructed patient to ambulate as quickly and as safely as possible for 6 minutes using LRAD. Patient was allowed to take standing rest breaks without stopping the test, but if the patient required a sitting rest break the clock would be stopped and the test would be over.  Results: 865 feet (264 meters) using a SPC with CGA. Results indicate that the patient has reduced endurance with ambulation compared to age matched norms.  Age Matched Norms: 67-69 yo M: 27 F: 29, 65-79 yo M: 77 F: 471, 68-89 yo M: 417 F: 392 MDC: 58.21 meters (190.98 feet) or 50 meters (ANPTA Core Set of Outcome Measures for Adults with Neurologic Conditions, 2018)   Added SLS and tandem to HEP- see below section   PATIENT EDUCATION: Education details: POC Person educated: Patient Education method: Explanation Education comprehension: verbalized understanding   HOME EXERCISE PROGRAM:  Access Code: [email or web address] URL: [email or web address] Date: 06/24/2023 Prepared by: Maureen Ralphs  Exercises - Tandem Stance  - 1 x daily - 3 x weekly - 4 sets - 3 hold - Single Leg Stance  - 1 x daily - 3 x weekly - 5 sets - 5-20 hold     GOALS: Goals reviewed with patient? Yes  SHORT TERM GOALS: Target date: 07/13/2023       Patient will be independent in home exercise program to improve strength/mobility for better functional independence with ADLs. Baseline: No HEP currently  Goal status: INITIAL   LONG TERM GOALS: Target date: 09/14/2023  1.  Patient (> 33 years old) will complete five times sit to stand test in < 15 seconds indicating an increased LE strength and improved balance. Baseline: 17.3 sec Goal status: INITIAL  2.  Patient will increase FOTO score to equal to or greater than  55   to demonstrate  statistically significant improvement in mobility and quality of life.  Baseline: 51 Goal status: INITIAL   3.  Patient will increase  Berg Balance score by > 6 points to demonstrate decreased fall risk during functional activities. Baseline: 06/24/2023= 47/56 Goal status: INITIAL   4.   Patient will improve dynamic gait index to 19 or greater to reduce fall risk and demonstrate improved transfer/gait ability. Baseline: 13 Goal status: INITIAL  5.   Patient will increase 10 meter walk test to >1.46m/s as to improve gait speed for better community ambulation and to reduce fall risk. Baseline: .75 m/s Goal status: INITIAL  6.   Patient will increase six minute walk test distance to >1000 for progression to community ambulator and improve gait ability Baseline: 06/24/2023=865 Goal status: INITIAL    ASSESSMENT:  CLINICAL IMPRESSION: Patient presents with excellent motivation for today's session - completing some testing requested after initial evaluation and adding to goals. He presents with impaired balance scoring 47/56 indicating some balance impairment and increased risk of falling. He also presented with below normative data for 6 min walk and will benefit from further balance and endurance training. He was issued some basic balance activities to begin his HEP but will benefit from further training and addition to HEP as appropriate.   Patient will benefit from skilled physical therapy in order to improve his lower extremity strength, balance, functional capacity, and decrease his risk of falls.  OBJECTIVE IMPAIRMENTS: Abnormal gait, decreased activity tolerance, decreased balance, decreased endurance, decreased mobility, difficulty walking, and decreased strength.   ACTIVITY LIMITATIONS: squatting and stairs  PARTICIPATION LIMITATIONS: shopping, community activity, and yard work  PERSONAL FACTORS: Age and 3+ comorbidities: hypertension, arthritis, chronic kidney disease, COPD,  diastolic dysfunction  are also affecting patient's functional outcome.   REHAB POTENTIAL: Good  CLINICAL DECISION MAKING: Stable/uncomplicated  EVALUATION COMPLEXITY: Low  PLAN:  PT FREQUENCY: 1-2x/week  PT DURATION: 8 weeks  PLANNED INTERVENTIONS: Therapeutic exercises, Therapeutic activity, Neuromuscular re-education, Balance training, Gait training, Patient/Family education, Self Care, Joint mobilization, Stair training, and Manual therapy  PLAN FOR NEXT SESSION: review and progress balance - add to HEP as appropriate.    Lenda Kelp, PT 06/25/2023, 11:48 AM

## 2023-06-29 ENCOUNTER — Ambulatory Visit: Payer: Medicare HMO

## 2023-06-30 NOTE — Therapy (Signed)
OUTPATIENT PHYSICAL THERAPY NEURO TREATMENT    Patient Name: James [name] MRN: [id number] DOB:10-24-1935, 87 y.o., male Today's Date: 06/30/2023   PCP: Margaretann Loveless, MD  REFERRING PROVIDER: Morene Crocker, MD   END OF SESSION:    Past Medical History:  Diagnosis Date   Anxiety    Aortic atherosclerosis (HCC)    Aortic dilatation (HCC) 01/10/2021   a.) CT 01/10/2021: infrarenal aortic dilitation measuring 3.3 x 3.3 cm. b.) CT 06/23/2021: measured 3.4 cm.   Arthritis    B12 deficiency    Basal cell carcinoma 03/14/2019   R nasal tip    Bilateral renal cysts    BPH (benign prostatic hyperplasia)    Bradycardia    Chronic kidney disease    COPD (chronic obstructive pulmonary disease) (HCC)    Coronary artery disease    a.) s/p 2v CABG in 2001; performed while living in [facility] [place].   Diastolic dysfunction 06/2020   a.) TTE 06/2020: EF 60%; sev LA dilation, mild RA dilation; triv PR, mild to mod TR/MR/AR; G1DD.   Frequency of urination    GERD (gastroesophageal reflux disease)    Hiatal hernia    History of kidney stones    HLD (hyperlipidemia)    Hypertension    Internal hemorrhoids    Long term current use of anticoagulant    a.) Rivaroxaban   Melanoma (HCC) 11/11/2022   right infra orbital - MMIS discuss tx options   Mobitz type 1 second degree AV block    OSA (obstructive sleep apnea)    a.) does not require nocturnal PAP therapy   Osteopenia    Pulmonary HTN (HCC) 06/2020   a.) mild   S/P CABG x 2 2001   a.) details unknown; performed in 2001 in [place].   Past Surgical History:  Procedure Laterality Date   CARDIAC CATHETERIZATION     COLONOSCOPY     CORONARY ARTERY BYPASS GRAFT     CYSTOSCOPY/URETEROSCOPY/HOLMIUM LASER/STENT PLACEMENT Right 11/07/2021   Procedure: CYSTOSCOPY/URETEROSCOPY/HOLMIUM LASER/STENT PLACEMENT;  Surgeon: Sondra Come, MD;  Location: [facility] ORS;  Service: Urology;  Laterality: Right;   FRACTURE SURGERY Left     fractured left arm   HOLEP-LASER ENUCLEATION OF THE PROSTATE WITH MORCELLATION N/A 11/07/2021   Procedure: HOLEP-LASER ENUCLEATION OF THE PROSTATE WITH MORCELLATION;  Surgeon: Sondra Come, MD;  Location: [facility] ORS;  Service: Urology;  Laterality: N/A;   LIPOMA EXCISION N/A 11/21/2018   Procedure: EXCISION BACK LIPOMA;  Surgeon: Griselda Miner, MD;  Location: [facility] OR;  Service: General;  Laterality: N/A;   PILONIDAL CYST EXCISION     SKIN CANCER EXCISION     TOTAL SHOULDER REPLACEMENT Left    UMBILICAL HERNIA REPAIR N/A 11/21/2018   Procedure: HERNIA REPAIR UMBILICAL ADULT WITH MESH;  Surgeon: Griselda Miner, MD;  Location: [facility] OR;  Service: General;  Laterality: N/A;   Patient Active Problem List   Diagnosis Date Noted   Gastroesophageal reflux disease without esophagitis 05/07/2023   GAD (generalized anxiety disorder) 02/08/2023   Unsteady gait when walking 11/09/2022   BPH with obstruction/lower urinary tract symptoms 03/21/2020   Nephrolithiasis 03/21/2020   Bradycardia, sinus 03/16/2019   Dizziness 03/16/2019   Essential hypertension, benign 03/16/2019   Renal cyst 06/24/2018   Hiatal hernia 06/13/2018   Coronary atherosclerosis of autologous vein bypass graft 06/22/2017   Vitamin B12 deficiency 12/30/2016   Vitamin D deficiency 12/28/2016   Family history of malignant neoplasm of other organs or systems 12/08/2016  Obstructive sleep apnea (adult) (pediatric) 01/01/2016   Overweight 12/16/2015   Diverticulosis of colon 11/01/2015   Internal hemorrhoid 11/01/2015   Senile ectropion of left lower eyelid 06/28/2014   Cherry angioma 02/22/2014   Lentigo 02/22/2014   Actinic keratosis 02/22/2014   Not current smoker 05/23/2013   Personal history of other malignant neoplasm of skin 05/23/2013   Hardening of the aorta (main artery of the heart) (HCC) 07/17/2012   Anxiety 06/15/2012   COPD (chronic obstructive pulmonary disease) (HCC) 06/15/2012   Neck mass 10/17/2011    Anemia 08/06/2011   Closed fracture of part of upper end of humerus 08/03/2011   Hx of CABG 01/22/2011   Hyperlipidemia 01/21/2011   Osteopenia 05/01/2010   Dyspepsia 03/08/2010   Colon polyp 05/09/2009   Tobacco abuse counseling 11/11/2007    ONSET DATE: 02/04/23  REFERRING DIAG: R26.81 (ICD-10-CM) - Unsteady gait   THERAPY DIAG:  No diagnosis found.  Rationale for Evaluation and Treatment: Rehabilitation  SUBJECTIVE:                                                                                                                                                                                             SUBJECTIVE STATEMENT: ***   Pt accompanied by: self  PERTINENT HISTORY: James [name] is a 87 y.o. male past medical history of hypertension, arthritis, chronic kidney disease, COPD, diastolic dysfunction . Pt reports he feels his main problem is with his left leg. Many years back he fractured it and was in a cast for a long time. Pt reports he has a fear of falling, he works on his balance doing exercises performing exercises the neurologist gave him. Pt took a bad fall in his house and ended up with 5 staples in his head. He reports the good thing was his IQ was better [name]). Pt was going to the gym regularly and goes to the gym. Pt just started and goes for 1-1.5 hours at a time.   PAIN:  Are you having pain? No  PRECAUTIONS: None  RED FLAGS: None   WEIGHT BEARING RESTRICTIONS: No  FALLS: Has patient fallen in last 6 months? Yes. Number of falls 2  LIVING ENVIRONMENT: Lives with: lives alone Lives in: House/apartment Stairs:  elevator but has stairs for exercise  Has following equipment at home: Single point cane, shower chair, and Grab bars  PLOF: Independent and Independent with basic ADLs  PATIENT GOALS: Improve strength and balance  OBJECTIVE:  Note: Objective measures were completed at Evaluation unless otherwise noted.  DIAGNOSTIC FINDINGS: No  significant radiological findings in the chart pertaining to this  particular problem  COGNITION: Overall cognitive status: Within functional limits for tasks assessed   SENSATION: WFL   POSTURE:  In standing and walking patient has posterior weight shift  LOWER EXTREMITY ROM:     WNL  LOWER EXTREMITY MMT:    MMT Right Eval Left Eval  Hip flexion 4+ 4+  Hip extension    Hip abduction 4+ 4+  Hip adduction 4+ 4+  Hip internal rotation    Hip external rotation    Knee flexion 4+ 4+  Knee extension 4+ 4+  Ankle dorsiflexion    Ankle plantarflexion    Ankle inversion    Ankle eversion    (Blank rows = not tested) All tested in seated position  TRANSFERS: Assistive device utilized: None  Sit to stand: Modified independence Stand to sit: Complete Independence Chair to chair: Complete Independence     CURB:  Comments:  Difficulty with stepping over objects likely causing difficulty with curb navigation with stepping over object with his left lower extremity patient displays loss of balance and makes contact with shoe box.  Likely similar result if attempting a curb of a similar height Assistive device utilized: None   STAIRS: Level of Assistance: Modified independence Stair Negotiation Technique: Alternating Pattern  with Bilateral Rails Number of Stairs: 4  Height of Stairs: 6 in    GAIT: Gait pattern:  Slight posterior weight shift throughout ambulation.  Difficulty with performing several tasks of DGI see DGI for more specific results Distance walked: 10 m  FUNCTIONAL TESTS:  5 times sit to stand: 17.3 6 minute walk test: test visit 2  10 meter walk test: .75 m/s Dynamic Gait Index: see below      PATIENT SURVEYS:  FOTO 51  TODAY'S TREATMENT:                                                                                                                               Neuro Re-ed: Standing with CGA next to support surface:  Airex pad: static stand  30 seconds x 2 trials, noticeable trembling of ankles/LE's with fatigue and challenge to maintain stability Airex pad: horizontal head turns 30 seconds scanning room 10x ; cueing for arc of motion  Airex pad: vertical head turns 30 seconds, cueing for arc of motion, noticeable sway with upward gaze increasing demand on ankle righting reaction musculature Airex pad: one foot on 6" step one foot on airex pad, hold position for 30 seconds, switch legs, 2x each LE;  TherEx: Sit to stand 10x   PATIENT EDUCATION: Education details: POC Person educated: Patient Education method: Explanation Education comprehension: verbalized understanding   HOME EXERCISE PROGRAM:  Access Code: [email or web address] URL: [email or web address] Date: 06/24/2023 Prepared by: Maureen Ralphs  Exercises - Tandem Stance  - 1 x daily - 3 x weekly - 4 sets - 3 hold - Single Leg Stance  - 1 x daily - 3 x weekly - 5 sets - 5-20 hold  GOALS: Goals reviewed with patient? Yes  SHORT TERM GOALS: Target date: 07/13/2023       Patient will be independent in home exercise program to improve strength/mobility for better functional independence with ADLs. Baseline: No HEP currently  Goal status: INITIAL   LONG TERM GOALS: Target date: 09/14/2023  1.  Patient (> 73 years old) will complete five times sit to stand test in < 15 seconds indicating an increased LE strength and improved balance. Baseline: 17.3 sec Goal status: INITIAL  2.  Patient will increase FOTO score to equal to or greater than  55   to demonstrate statistically significant improvement in mobility and quality of life.  Baseline: 51 Goal status: INITIAL   3.  Patient will increase Berg Balance score by > 6 points to demonstrate decreased fall risk during functional activities. Baseline: 06/24/2023= 47/56 Goal status: INITIAL   4.   Patient will improve dynamic gait index to 19 or greater to reduce fall risk and demonstrate  improved transfer/gait ability. Baseline: 13 Goal status: INITIAL  5.   Patient will increase 10 meter walk test to >1.67m/s as to improve gait speed for better community ambulation and to reduce fall risk. Baseline: .75 m/s Goal status: INITIAL  6.   Patient will increase six minute walk test distance to >1000 for progression to community ambulator and improve gait ability Baseline: 06/24/2023=865 Goal status: INITIAL    ASSESSMENT:  CLINICAL IMPRESSION: *** Patient will benefit from skilled physical therapy in order to improve his lower extremity strength, balance, functional capacity, and decrease his risk of falls.  OBJECTIVE IMPAIRMENTS: Abnormal gait, decreased activity tolerance, decreased balance, decreased endurance, decreased mobility, difficulty walking, and decreased strength.   ACTIVITY LIMITATIONS: squatting and stairs  PARTICIPATION LIMITATIONS: shopping, community activity, and yard work  PERSONAL FACTORS: Age and 3+ comorbidities: hypertension, arthritis, chronic kidney disease, COPD, diastolic dysfunction  are also affecting patient's functional outcome.   REHAB POTENTIAL: Good  CLINICAL DECISION MAKING: Stable/uncomplicated  EVALUATION COMPLEXITY: Low  PLAN:  PT FREQUENCY: 1-2x/week  PT DURATION: 8 weeks  PLANNED INTERVENTIONS: Therapeutic exercises, Therapeutic activity, Neuromuscular re-education, Balance training, Gait training, Patient/Family education, Self Care, Joint mobilization, Stair training, and Manual therapy  PLAN FOR NEXT SESSION: review and progress balance - add to HEP as appropriate.    Precious Bard, PT 06/30/2023, 12:21 PM

## 2023-07-01 ENCOUNTER — Ambulatory Visit: Payer: Medicare HMO

## 2023-07-01 ENCOUNTER — Ambulatory Visit: Payer: Medicare HMO | Admitting: Dermatology

## 2023-07-01 DIAGNOSIS — R269 Unspecified abnormalities of gait and mobility: Secondary | ICD-10-CM

## 2023-07-01 DIAGNOSIS — R2689 Other abnormalities of gait and mobility: Secondary | ICD-10-CM

## 2023-07-01 DIAGNOSIS — M6281 Muscle weakness (generalized): Secondary | ICD-10-CM

## 2023-07-01 DIAGNOSIS — R278 Other lack of coordination: Secondary | ICD-10-CM | POA: Diagnosis not present

## 2023-07-01 DIAGNOSIS — R262 Difficulty in walking, not elsewhere classified: Secondary | ICD-10-CM | POA: Diagnosis not present

## 2023-07-05 ENCOUNTER — Ambulatory Visit: Payer: Medicare HMO

## 2023-07-05 ENCOUNTER — Telehealth: Payer: Self-pay

## 2023-07-05 DIAGNOSIS — R296 Repeated falls: Secondary | ICD-10-CM | POA: Diagnosis not present

## 2023-07-05 DIAGNOSIS — R2689 Other abnormalities of gait and mobility: Secondary | ICD-10-CM | POA: Diagnosis not present

## 2023-07-05 DIAGNOSIS — G250 Essential tremor: Secondary | ICD-10-CM | POA: Diagnosis not present

## 2023-07-05 DIAGNOSIS — R42 Dizziness and giddiness: Secondary | ICD-10-CM | POA: Diagnosis not present

## 2023-07-05 DIAGNOSIS — R413 Other amnesia: Secondary | ICD-10-CM | POA: Diagnosis not present

## 2023-07-05 NOTE — Telephone Encounter (Signed)
Patient called due to no show. Notified of next appointment.    Precious Bard, PT, DPT Physical Therapist - [place] [facility]  Outpatient Physical Therapy- Main Campus [phone]

## 2023-07-07 ENCOUNTER — Ambulatory Visit: Payer: Medicare HMO | Admitting: Physical Therapy

## 2023-07-07 DIAGNOSIS — R278 Other lack of coordination: Secondary | ICD-10-CM | POA: Diagnosis not present

## 2023-07-07 DIAGNOSIS — R262 Difficulty in walking, not elsewhere classified: Secondary | ICD-10-CM

## 2023-07-07 DIAGNOSIS — M6281 Muscle weakness (generalized): Secondary | ICD-10-CM

## 2023-07-07 DIAGNOSIS — R269 Unspecified abnormalities of gait and mobility: Secondary | ICD-10-CM | POA: Diagnosis not present

## 2023-07-07 DIAGNOSIS — R2689 Other abnormalities of gait and mobility: Secondary | ICD-10-CM | POA: Diagnosis not present

## 2023-07-07 NOTE — Therapy (Signed)
OUTPATIENT PHYSICAL THERAPY NEURO TREATMENT    Patient Name: James [name] MRN: [id number] DOB:[birth year], 87 y.o., male Today's Date: 07/07/2023   PCP: Margaretann Loveless, MD  REFERRING PROVIDER: Morene Crocker, MD   END OF SESSION:  PT End of Session - 07/07/23 1535     Visit Number 4    Number of Visits 16    Date for PT Re-Evaluation 08/17/23    Progress Note Due on Visit 10    PT Start Time 1534    PT Stop Time 1613    PT Time Calculation (min) 39 min    Equipment Utilized During Treatment Gait belt    Activity Tolerance Patient tolerated treatment well;No increased pain    Behavior During Therapy [facility] for tasks assessed/performed               Past Medical History:  Diagnosis Date   Anxiety    Aortic atherosclerosis (HCC)    Aortic dilatation (HCC) 01/10/2021   a.) CT 01/10/2021: infrarenal aortic dilitation measuring 3.3 x 3.3 cm. b.) CT 06/23/2021: measured 3.4 cm.   Arthritis    B12 deficiency    Basal cell carcinoma 03/14/2019   R nasal tip    Bilateral renal cysts    BPH (benign prostatic hyperplasia)    Bradycardia    Chronic kidney disease    COPD (chronic obstructive pulmonary disease) (HCC)    Coronary artery disease    a.) s/p 2v CABG in 2001; performed while living in [facility] [place].   Diastolic dysfunction 06/2020   a.) TTE 06/2020: EF 60%; sev LA dilation, mild RA dilation; triv PR, mild to mod TR/MR/AR; G1DD.   Frequency of urination    GERD (gastroesophageal reflux disease)    Hiatal hernia    History of kidney stones    HLD (hyperlipidemia)    Hypertension    Internal hemorrhoids    Long term current use of anticoagulant    a.) Rivaroxaban   Melanoma (HCC) 11/11/2022   right infra orbital - MMIS discuss tx options   Mobitz type 1 second degree AV block    OSA (obstructive sleep apnea)    a.) does not require nocturnal PAP therapy   Osteopenia    Pulmonary HTN (HCC) 06/2020   a.) mild   S/P CABG x 2 2001   a.)  details unknown; performed in 2001 in [place].   Past Surgical History:  Procedure Laterality Date   CARDIAC CATHETERIZATION     COLONOSCOPY     CORONARY ARTERY BYPASS GRAFT     CYSTOSCOPY/URETEROSCOPY/HOLMIUM LASER/STENT PLACEMENT Right 11/07/2021   Procedure: CYSTOSCOPY/URETEROSCOPY/HOLMIUM LASER/STENT PLACEMENT;  Surgeon: Sondra Come, MD;  Location: [facility] ORS;  Service: Urology;  Laterality: Right;   FRACTURE SURGERY Left    fractured left arm   HOLEP-LASER ENUCLEATION OF THE PROSTATE WITH MORCELLATION N/A 11/07/2021   Procedure: HOLEP-LASER ENUCLEATION OF THE PROSTATE WITH MORCELLATION;  Surgeon: Sondra Come, MD;  Location: [facility] ORS;  Service: Urology;  Laterality: N/A;   LIPOMA EXCISION N/A 11/21/2018   Procedure: EXCISION BACK LIPOMA;  Surgeon: Griselda Miner, MD;  Location: [facility] OR;  Service: General;  Laterality: N/A;   PILONIDAL CYST EXCISION     SKIN CANCER EXCISION     TOTAL SHOULDER REPLACEMENT Left    UMBILICAL HERNIA REPAIR N/A 11/21/2018   Procedure: HERNIA REPAIR UMBILICAL ADULT WITH MESH;  Surgeon: Griselda Miner, MD;  Location: [facility] OR;  Service: General;  Laterality: N/A;   Patient  Active Problem List   Diagnosis Date Noted   Gastroesophageal reflux disease without esophagitis 05/07/2023   GAD (generalized anxiety disorder) 02/08/2023   Unsteady gait when walking 11/09/2022   BPH with obstruction/lower urinary tract symptoms 03/21/2020   Nephrolithiasis 03/21/2020   Bradycardia, sinus 03/16/2019   Dizziness 03/16/2019   Essential hypertension, benign 03/16/2019   Renal cyst 06/24/2018   Hiatal hernia 06/13/2018   Coronary atherosclerosis of autologous vein bypass graft 06/22/2017   Vitamin B12 deficiency 12/30/2016   Vitamin D deficiency 12/28/2016   Family history of malignant neoplasm of other organs or systems 12/08/2016   Obstructive sleep apnea (adult) (pediatric) 01/01/2016   Overweight 12/16/2015   Diverticulosis of colon 11/01/2015   Internal  hemorrhoid 11/01/2015   Senile ectropion of left lower eyelid 06/28/2014   Cherry angioma 02/22/2014   Lentigo 02/22/2014   Actinic keratosis 02/22/2014   Not current smoker 05/23/2013   Personal history of other malignant neoplasm of skin 05/23/2013   Hardening of the aorta (main artery of the heart) (HCC) 07/17/2012   Anxiety 06/15/2012   COPD (chronic obstructive pulmonary disease) (HCC) 06/15/2012   Neck mass 10/17/2011   Anemia 08/06/2011   Closed fracture of part of upper end of humerus 08/03/2011   Hx of CABG 01/22/2011   Hyperlipidemia 01/21/2011   Osteopenia 05/01/2010   Dyspepsia 03/08/2010   Colon polyp 05/09/2009   Tobacco abuse counseling 11/11/2007    ONSET DATE: 02/04/23  REFERRING DIAG: R26.81 (ICD-10-CM) - Unsteady gait   THERAPY DIAG:  Abnormality of gait and mobility  Difficulty in walking, not elsewhere classified  Muscle weakness (generalized)  Other abnormalities of gait and mobility  Rationale for Evaluation and Treatment: Rehabilitation  SUBJECTIVE:                                                                                                                                                                                             SUBJECTIVE STATEMENT: No stumbles or falls since last session.    Pt accompanied by: self  PERTINENT HISTORY: James [name] is a 87 y.o. male past medical history of hypertension, arthritis, chronic kidney disease, COPD, diastolic dysfunction . Pt reports he feels his main problem is with his left leg. Many years back he fractured it and was in a cast for a long time. Pt reports he has a fear of falling, he works on his balance doing exercises performing exercises the neurologist gave him. Pt took a bad fall in his house and ended up with 5 staples in his head. He reports the good thing was his IQ was better [name]). Pt  was going to the gym regularly and goes to the gym. Pt just started and goes for 1-1.5 hours  at a time.   PAIN:  Are you having pain? No  PRECAUTIONS: None  RED FLAGS: None   WEIGHT BEARING RESTRICTIONS: No  FALLS: Has patient fallen in last 6 months? Yes. Number of falls 2  LIVING ENVIRONMENT: Lives with: lives alone Lives in: House/apartment Stairs:  elevator but has stairs for exercise  Has following equipment at home: Single point cane, shower chair, and Grab bars  PLOF: Independent and Independent with basic ADLs  PATIENT GOALS: Improve strength and balance  OBJECTIVE:  Note: Objective measures were completed at Evaluation unless otherwise noted.  DIAGNOSTIC FINDINGS: No significant radiological findings in the chart pertaining to this particular problem  COGNITION: Overall cognitive status: Within functional limits for tasks assessed   SENSATION: WFL   POSTURE:  In standing and walking patient has posterior weight shift  LOWER EXTREMITY ROM:     WNL  LOWER EXTREMITY MMT:    MMT Right Eval Left Eval  Hip flexion 4+ 4+  Hip extension    Hip abduction 4+ 4+  Hip adduction 4+ 4+  Hip internal rotation    Hip external rotation    Knee flexion 4+ 4+  Knee extension 4+ 4+  Ankle dorsiflexion    Ankle plantarflexion    Ankle inversion    Ankle eversion    (Blank rows = not tested) All tested in seated position  TRANSFERS: Assistive device utilized: None  Sit to stand: Modified independence Stand to sit: Complete Independence Chair to chair: Complete Independence     CURB:  Comments:  Difficulty with stepping over objects likely causing difficulty with curb navigation with stepping over object with his left lower extremity patient displays loss of balance and makes contact with shoe box.  Likely similar result if attempting a curb of a similar height Assistive device utilized: None   STAIRS: Level of Assistance: Modified independence Stair Negotiation Technique: Alternating Pattern  with Bilateral Rails Number of Stairs: 4  Height  of Stairs: 6 in    GAIT: Gait pattern:  Slight posterior weight shift throughout ambulation.  Difficulty with performing several tasks of DGI see DGI for more specific results Distance walked: 10 m  FUNCTIONAL TESTS:  5 times sit to stand: 17.3 6 minute walk test: test visit 2  10 meter walk test: .75 m/s Dynamic Gait Index: see below      PATIENT SURVEYS:  FOTO 51  TODAY'S TREATMENT:                                                                                                                               Neuro Re-ed:  Ambulation with 2.5# AW x 150 ft with 40 ft of retro walking, poor foot clearance noted   Heel strike foot clearance practice 2 x 10 ea LE, cues for proper performance   X 300  ft ambulation with 2.5#AW   Airex pad: one foot on 6" step one foot on airex pad, hold position for 30 seconds, switch legs, 2x each LE;  Standing heel raise on 1/2 foam roll for improved range of motion of target musculature 3 x 10 reps, cues for full PF range of motion.   Bosu stance 3 x 30 sec with frequent UE support   Step on / off airex laterally 2 x 10 ea side, cues for foot positioning, intermittent UE assist required.   PATIENT EDUCATION: Education details: POC Person educated: Patient Education method: Explanation Education comprehension: verbalized understanding   HOME EXERCISE PROGRAM:  Access Code: [email or web address] URL: [email or web address] Date: 07/07/2023 Prepared by: Thresa Ross  Exercises - Single Leg Stance  - 1 x daily - 3 x weekly - 5 sets - 5-20 hold - Tandem Stance  - 1 x daily - 3 x weekly - 4 sets - 3 hold - Tandem Stance with Head Rotation  - 1 x daily - 3 x weekly - 3 sets - 10 reps - Heel Raises with Counter Support  - 1 x daily - 3 x weekly - 3 sets - 15 reps - Heel Toe Raises with Counter Support  - 1 x daily - 3 x weekly - 3 sets - 15 reps    GOALS: Goals reviewed with patient? Yes  SHORT TERM GOALS: Target date:  07/13/2023       Patient will be independent in home exercise program to improve strength/mobility for better functional independence with ADLs. Baseline: No HEP currently  Goal status: INITIAL   LONG TERM GOALS: Target date: 09/14/2023  1.  Patient (> 25 years old) will complete five times sit to stand test in < 15 seconds indicating an increased LE strength and improved balance. Baseline: 17.3 sec Goal status: INITIAL  2.  Patient will increase FOTO score to equal to or greater than  55   to demonstrate statistically significant improvement in mobility and quality of life.  Baseline: 51 Goal status: INITIAL   3.  Patient will increase Berg Balance score by > 6 points to demonstrate decreased fall risk during functional activities. Baseline: 06/24/2023= 47/56 Goal status: INITIAL   4.   Patient will improve dynamic gait index to 19 or greater to reduce fall risk and demonstrate improved transfer/gait ability. Baseline: 13 Goal status: INITIAL  5.   Patient will increase 10 meter walk test to >1.35m/s as to improve gait speed for better community ambulation and to reduce fall risk. Baseline: .75 m/s Goal status: INITIAL  6.   Patient will increase six minute walk test distance to >1000 for progression to community ambulator and improve gait ability Baseline: 06/24/2023=865 Goal status: INITIAL    ASSESSMENT:  CLINICAL IMPRESSION:  Patient presents with good motivation for completion of physical therapy activities.  Patient continues with primarily balance and gait based activities as patient is going to [employer] following session to complete some endurance and strengthening work.  Patient tolerated well but had some difficulty with higher-level balance particularly with dynamic balance including sidestepping on and off of Airex pad as well as higher-level static balance including standing on Bosu ball.  Routine to challenge patient as appropriate future sessions to  continue to work on improving his balance and his mobility.  Patient did show improved pattern gait following heel strike training but his balance was off with this but did improve with practice.  Patient will benefit from skilled physical therapy  in order to improve his lower extremity strength, balance, functional capacity, and decrease his risk of falls.  OBJECTIVE IMPAIRMENTS: Abnormal gait, decreased activity tolerance, decreased balance, decreased endurance, decreased mobility, difficulty walking, and decreased strength.   ACTIVITY LIMITATIONS: squatting and stairs  PARTICIPATION LIMITATIONS: shopping, community activity, and yard work  PERSONAL FACTORS: Age and 3+ comorbidities: hypertension, arthritis, chronic kidney disease, COPD, diastolic dysfunction  are also affecting patient's functional outcome.   REHAB POTENTIAL: Good  CLINICAL DECISION MAKING: Stable/uncomplicated  EVALUATION COMPLEXITY: Low  PLAN:  PT FREQUENCY: 1-2x/week  PT DURATION: 8 weeks  PLANNED INTERVENTIONS: Therapeutic exercises, Therapeutic activity, Neuromuscular re-education, Balance training, Gait training, Patient/Family education, Self Care, Joint mobilization, Stair training, and Manual therapy  PLAN FOR NEXT SESSION: review and progress balance - add to HEP as appropriate.    Norman Herrlich, PT 07/07/2023, 3:36 PM

## 2023-07-13 ENCOUNTER — Encounter: Payer: Self-pay | Admitting: Physical Therapy

## 2023-07-13 ENCOUNTER — Ambulatory Visit: Payer: Medicare HMO | Admitting: Physical Therapy

## 2023-07-13 DIAGNOSIS — M6281 Muscle weakness (generalized): Secondary | ICD-10-CM

## 2023-07-13 DIAGNOSIS — R2689 Other abnormalities of gait and mobility: Secondary | ICD-10-CM | POA: Diagnosis not present

## 2023-07-13 DIAGNOSIS — R269 Unspecified abnormalities of gait and mobility: Secondary | ICD-10-CM

## 2023-07-13 DIAGNOSIS — R278 Other lack of coordination: Secondary | ICD-10-CM | POA: Diagnosis not present

## 2023-07-13 DIAGNOSIS — R262 Difficulty in walking, not elsewhere classified: Secondary | ICD-10-CM

## 2023-07-13 NOTE — Therapy (Signed)
OUTPATIENT PHYSICAL THERAPY NEURO TREATMENT    Patient Name: James [name] MRN: [id number] DOB:[birth year], 87 y.o., male Today's Date: 07/13/2023   PCP: Margaretann Loveless, MD  REFERRING PROVIDER: Morene Crocker, MD   END OF SESSION:  PT End of Session - 07/13/23 1616     Visit Number 5    Number of Visits 16    Date for PT Re-Evaluation 08/17/23    Progress Note Due on Visit 10    Equipment Utilized During Treatment Gait belt    Activity Tolerance Patient tolerated treatment well;No increased pain    Behavior During Therapy [facility] for tasks assessed/performed              Past Medical History:  Diagnosis Date   Anxiety    Aortic atherosclerosis (HCC)    Aortic dilatation (HCC) 01/10/2021   a.) CT 01/10/2021: infrarenal aortic dilitation measuring 3.3 x 3.3 cm. b.) CT 06/23/2021: measured 3.4 cm.   Arthritis    B12 deficiency    Basal cell carcinoma 03/14/2019   R nasal tip    Bilateral renal cysts    BPH (benign prostatic hyperplasia)    Bradycardia    Chronic kidney disease    COPD (chronic obstructive pulmonary disease) (HCC)    Coronary artery disease    a.) s/p 2v CABG in 2001; performed while living in [facility] [place].   Diastolic dysfunction 06/2020   a.) TTE 06/2020: EF 60%; sev LA dilation, mild RA dilation; triv PR, mild to mod TR/MR/AR; G1DD.   Frequency of urination    GERD (gastroesophageal reflux disease)    Hiatal hernia    History of kidney stones    HLD (hyperlipidemia)    Hypertension    Internal hemorrhoids    Long term current use of anticoagulant    a.) Rivaroxaban   Melanoma (HCC) 11/11/2022   right infra orbital - MMIS discuss tx options   Mobitz type 1 second degree AV block    OSA (obstructive sleep apnea)    a.) does not require nocturnal PAP therapy   Osteopenia    Pulmonary HTN (HCC) 06/2020   a.) mild   S/P CABG x 2 2001   a.) details unknown; performed in 2001 in [place].   Past Surgical History:  Procedure  Laterality Date   CARDIAC CATHETERIZATION     COLONOSCOPY     CORONARY ARTERY BYPASS GRAFT     CYSTOSCOPY/URETEROSCOPY/HOLMIUM LASER/STENT PLACEMENT Right 11/07/2021   Procedure: CYSTOSCOPY/URETEROSCOPY/HOLMIUM LASER/STENT PLACEMENT;  Surgeon: Sondra Come, MD;  Location: [facility] ORS;  Service: Urology;  Laterality: Right;   FRACTURE SURGERY Left    fractured left arm   HOLEP-LASER ENUCLEATION OF THE PROSTATE WITH MORCELLATION N/A 11/07/2021   Procedure: HOLEP-LASER ENUCLEATION OF THE PROSTATE WITH MORCELLATION;  Surgeon: Sondra Come, MD;  Location: [facility] ORS;  Service: Urology;  Laterality: N/A;   LIPOMA EXCISION N/A 11/21/2018   Procedure: EXCISION BACK LIPOMA;  Surgeon: Griselda Miner, MD;  Location: [facility] OR;  Service: General;  Laterality: N/A;   PILONIDAL CYST EXCISION     SKIN CANCER EXCISION     TOTAL SHOULDER REPLACEMENT Left    UMBILICAL HERNIA REPAIR N/A 11/21/2018   Procedure: HERNIA REPAIR UMBILICAL ADULT WITH MESH;  Surgeon: Griselda Miner, MD;  Location: [facility] OR;  Service: General;  Laterality: N/A;   Patient Active Problem List   Diagnosis Date Noted   Gastroesophageal reflux disease without esophagitis 05/07/2023   GAD (generalized anxiety disorder) 02/08/2023  Unsteady gait when walking 11/09/2022   BPH with obstruction/lower urinary tract symptoms 03/21/2020   Nephrolithiasis 03/21/2020   Bradycardia, sinus 03/16/2019   Dizziness 03/16/2019   Essential hypertension, benign 03/16/2019   Renal cyst 06/24/2018   Hiatal hernia 06/13/2018   Coronary atherosclerosis of autologous vein bypass graft 06/22/2017   Vitamin B12 deficiency 12/30/2016   Vitamin D deficiency 12/28/2016   Family history of malignant neoplasm of other organs or systems 12/08/2016   Obstructive sleep apnea (adult) (pediatric) 01/01/2016   Overweight 12/16/2015   Diverticulosis of colon 11/01/2015   Internal hemorrhoid 11/01/2015   Senile ectropion of left lower eyelid 06/28/2014   Cherry  angioma 02/22/2014   Lentigo 02/22/2014   Actinic keratosis 02/22/2014   Not current smoker 05/23/2013   Personal history of other malignant neoplasm of skin 05/23/2013   Hardening of the aorta (main artery of the heart) (HCC) 07/17/2012   Anxiety 06/15/2012   COPD (chronic obstructive pulmonary disease) (HCC) 06/15/2012   Neck mass 10/17/2011   Anemia 08/06/2011   Closed fracture of part of upper end of humerus 08/03/2011   Hx of CABG 01/22/2011   Hyperlipidemia 01/21/2011   Osteopenia 05/01/2010   Dyspepsia 03/08/2010   Colon polyp 05/09/2009   Tobacco abuse counseling 11/11/2007    ONSET DATE: 02/04/23  REFERRING DIAG: R26.81 (ICD-10-CM) - Unsteady gait   THERAPY DIAG:  Abnormality of gait and mobility  Difficulty in walking, not elsewhere classified  Muscle weakness (generalized)  Other abnormalities of gait and mobility  Rationale for Evaluation and Treatment: Rehabilitation  SUBJECTIVE:                                                                                                                                                                                             SUBJECTIVE STATEMENT: No stumbles or falls since last session. Pt did brief warm up in gym prior to session.    Pt accompanied by: self  PERTINENT HISTORY: James [name] is a 87 y.o. male past medical history of hypertension, arthritis, chronic kidney disease, COPD, diastolic dysfunction . Pt reports he feels his main problem is with his left leg. Many years back he fractured it and was in a cast for a long time. Pt reports he has a fear of falling, he works on his balance doing exercises performing exercises the neurologist gave him. Pt took a bad fall in his house and ended up with 5 staples in his head. He reports the good thing was his IQ was better [name]). Pt was going to the gym regularly and goes to the gym. Pt just started and  goes for 1-1.5 hours at a time.   PAIN:  Are you having  pain? No  PRECAUTIONS: None  RED FLAGS: None   WEIGHT BEARING RESTRICTIONS: No  FALLS: Has patient fallen in last 6 months? Yes. Number of falls 2  LIVING ENVIRONMENT: Lives with: lives alone Lives in: House/apartment Stairs:  elevator but has stairs for exercise  Has following equipment at home: Single point cane, shower chair, and Grab bars  PLOF: Independent and Independent with basic ADLs  PATIENT GOALS: Improve strength and balance  OBJECTIVE:  Note: Objective measures were completed at Evaluation unless otherwise noted.  DIAGNOSTIC FINDINGS: No significant radiological findings in the chart pertaining to this particular problem  COGNITION: Overall cognitive status: Within functional limits for tasks assessed   SENSATION: WFL   POSTURE:  In standing and walking patient has posterior weight shift  LOWER EXTREMITY ROM:     WNL  LOWER EXTREMITY MMT:    MMT Right Eval Left Eval  Hip flexion 4+ 4+  Hip extension    Hip abduction 4+ 4+  Hip adduction 4+ 4+  Hip internal rotation    Hip external rotation    Knee flexion 4+ 4+  Knee extension 4+ 4+  Ankle dorsiflexion    Ankle plantarflexion    Ankle inversion    Ankle eversion    (Blank rows = not tested) All tested in seated position  TRANSFERS: Assistive device utilized: None  Sit to stand: Modified independence Stand to sit: Complete Independence Chair to chair: Complete Independence     CURB:  Comments:  Difficulty with stepping over objects likely causing difficulty with curb navigation with stepping over object with his left lower extremity patient displays loss of balance and makes contact with shoe box.  Likely similar result if attempting a curb of a similar height Assistive device utilized: None   STAIRS: Level of Assistance: Modified independence Stair Negotiation Technique: Alternating Pattern  with Bilateral Rails Number of Stairs: 4  Height of Stairs: 6 in    GAIT: Gait  pattern:  Slight posterior weight shift throughout ambulation.  Difficulty with performing several tasks of DGI see DGI for more specific results Distance walked: 10 m  FUNCTIONAL TESTS:  5 times sit to stand: 17.3 6 minute walk test: test visit 2  10 meter walk test: .75 m/s Dynamic Gait Index: see below      PATIENT SURVEYS:  FOTO 51  TODAY'S TREATMENT:                                                                                                                               Neuro Re-ed:  ambulate across stable and unstable surface outside. Negotiating changing surfaces from grass to sidewalk, across brick with turns and obstacles in pathway without LOB with 2.5#AW x 12 min with one rest period in seated position.    Heel strike foot clearance practice 2 x 10 ea LE, cues for proper  performance   Airex pad: Standing on Airex pad with 4 inch step anterior performing alternating step taps x 15 on each lower extremity with intermittent upper extremity support.   Bosu stance 3 x 30 sec with frequent UE support   Side Step on / off airex laterally  x 10 ea side, cues for foot positioning, intermittent UE assist required.    Standing heel raise on 1/2 foam roll for improved range of motion of target musculature x 15 reps, cues for full PF range of motion.   Unless otherwise stated, CGA was provided and gait belt donned in order to ensure pt safety   PATIENT EDUCATION: Education details: POC Person educated: Patient Education method: Explanation Education comprehension: verbalized understanding   HOME EXERCISE PROGRAM:  Access Code: [email or web address] URL: [email or web address] Date: 07/07/2023 Prepared by: Thresa Ross  Exercises - Single Leg Stance  - 1 x daily - 3 x weekly - 5 sets - 5-20 hold - Tandem Stance  - 1 x daily - 3 x weekly - 4 sets - 3 hold - Tandem Stance with Head Rotation  - 1 x daily - 3 x weekly - 3 sets - 10 reps - Heel Raises with  Counter Support  - 1 x daily - 3 x weekly - 3 sets - 15 reps - Heel Toe Raises with Counter Support  - 1 x daily - 3 x weekly - 3 sets - 15 reps    GOALS: Goals reviewed with patient? Yes  SHORT TERM GOALS: Target date: 07/13/2023       Patient will be independent in home exercise program to improve strength/mobility for better functional independence with ADLs. Baseline: No HEP currently  Goal status: INITIAL   LONG TERM GOALS: Target date: 09/14/2023  1.  Patient (> 37 years old) will complete five times sit to stand test in < 15 seconds indicating an increased LE strength and improved balance. Baseline: 17.3 sec Goal status: INITIAL  2.  Patient will increase FOTO score to equal to or greater than  55   to demonstrate statistically significant improvement in mobility and quality of life.  Baseline: 51 Goal status: INITIAL   3.  Patient will increase Berg Balance score by > 6 points to demonstrate decreased fall risk during functional activities. Baseline: 06/24/2023= 47/56 Goal status: INITIAL   4.   Patient will improve dynamic gait index to 19 or greater to reduce fall risk and demonstrate improved transfer/gait ability. Baseline: 13 Goal status: INITIAL  5.   Patient will increase 10 meter walk test to >1.33m/s as to improve gait speed for better community ambulation and to reduce fall risk. Baseline: .75 m/s Goal status: INITIAL  6.   Patient will increase six minute walk test distance to >1000 for progression to community ambulator and improve gait ability Baseline: 06/24/2023=865 Goal status: INITIAL    ASSESSMENT:  CLINICAL IMPRESSION:  Patient presents with good motivation for completion of physical therapy activities.  Patient continues to progress with high-level dynamic balance activities including ambulation across various surfaces and various inclines and declines.  Patient does show decreased foot clearance and compliant surfaces as well as walking  on inclines.  Patient also shows impaired balance and coordination with frontal plane dynamic balance activities.Pt will continue to benefit from skilled physical therapy intervention to address impairments, improve QOL, and attain therapy goals.    OBJECTIVE IMPAIRMENTS: Abnormal gait, decreased activity tolerance, decreased balance, decreased endurance, decreased mobility, difficulty walking, and decreased strength.  ACTIVITY LIMITATIONS: squatting and stairs  PARTICIPATION LIMITATIONS: shopping, community activity, and yard work  PERSONAL FACTORS: Age and 3+ comorbidities: hypertension, arthritis, chronic kidney disease, COPD, diastolic dysfunction  are also affecting patient's functional outcome.   REHAB POTENTIAL: Good  CLINICAL DECISION MAKING: Stable/uncomplicated  EVALUATION COMPLEXITY: Low  PLAN:  PT FREQUENCY: 1-2x/week  PT DURATION: 8 weeks  PLANNED INTERVENTIONS: Therapeutic exercises, Therapeutic activity, Neuromuscular re-education, Balance training, Gait training, Patient/Family education, Self Care, Joint mobilization, Stair training, and Manual therapy  PLAN FOR NEXT SESSION: review and progress balance - add to HEP as appropriate.    Norman Herrlich, PT 07/13/2023, 5:06 PM

## 2023-07-15 ENCOUNTER — Ambulatory Visit: Payer: Medicare HMO | Admitting: Physical Therapy

## 2023-07-20 ENCOUNTER — Ambulatory Visit: Payer: Medicare HMO

## 2023-07-28 ENCOUNTER — Ambulatory Visit: Payer: Medicare HMO | Attending: Neurology

## 2023-07-28 ENCOUNTER — Telehealth: Payer: Self-pay

## 2023-07-28 NOTE — Telephone Encounter (Signed)
Patient Name: James [name] MRN: [id number] DOB:11-Jun-1936, 87 y.o., male Today's Date: 07/28/2023  Pt contacted via telephone and author left voice mail informing of missed appointment and informed pt of future PT appointment date and time.    Lenda Kelp, PT 07/28/2023, 1:36 PM

## 2023-07-30 ENCOUNTER — Other Ambulatory Visit: Payer: Self-pay | Admitting: Cardiovascular Disease

## 2023-07-30 DIAGNOSIS — I10 Essential (primary) hypertension: Secondary | ICD-10-CM

## 2023-08-02 ENCOUNTER — Ambulatory Visit: Payer: Medicare HMO

## 2023-08-02 ENCOUNTER — Telehealth: Payer: Self-pay

## 2023-08-02 NOTE — Telephone Encounter (Signed)
Patient called due to second no show. Left voicemail and given phone number of front office to call back.   Precious Bard, PT, DPT Physical Therapist - [place] [facility]  Outpatient Physical Therapy- Main Campus [phone]

## 2023-08-04 ENCOUNTER — Ambulatory Visit: Payer: Medicare HMO

## 2023-08-09 ENCOUNTER — Ambulatory Visit: Payer: Medicare HMO

## 2023-08-13 ENCOUNTER — Ambulatory Visit: Payer: Medicare HMO

## 2023-08-16 ENCOUNTER — Ambulatory Visit (INDEPENDENT_AMBULATORY_CARE_PROVIDER_SITE_OTHER): Payer: Medicare HMO | Admitting: Internal Medicine

## 2023-08-16 ENCOUNTER — Encounter: Payer: Self-pay | Admitting: Internal Medicine

## 2023-08-16 ENCOUNTER — Ambulatory Visit: Payer: Medicare HMO

## 2023-08-16 VITALS — BP 160/90 | HR 63 | Ht 69.0 in | Wt 183.4 lb

## 2023-08-16 DIAGNOSIS — G4733 Obstructive sleep apnea (adult) (pediatric): Secondary | ICD-10-CM | POA: Diagnosis not present

## 2023-08-16 DIAGNOSIS — K219 Gastro-esophageal reflux disease without esophagitis: Secondary | ICD-10-CM

## 2023-08-16 DIAGNOSIS — I25810 Atherosclerosis of coronary artery bypass graft(s) without angina pectoris: Secondary | ICD-10-CM

## 2023-08-16 DIAGNOSIS — E782 Mixed hyperlipidemia: Secondary | ICD-10-CM | POA: Diagnosis not present

## 2023-08-16 DIAGNOSIS — Z0000 Encounter for general adult medical examination without abnormal findings: Secondary | ICD-10-CM

## 2023-08-16 DIAGNOSIS — I10 Essential (primary) hypertension: Secondary | ICD-10-CM

## 2023-08-16 NOTE — Progress Notes (Signed)
Established Patient Office Visit  Subjective:  Patient ID: James [name], male    DOB: 08-May-1936  Age: 87 y.o. MRN: [id number]  Chief Complaint  Patient presents with   Follow-up    3 month    Patient comes in for his follow-up and annual wellness visit.  His blood pressure is high this morning but he reports that whenever he is in the doctor's office his numbers do run high, and at home they are within normal limits.  Denies chest pain or shortness of breath.  No palpitations and no dizziness.  He will return fasting for his blood work. Currently he is taking all his medications.  He admits that he worries about his memory.  But his CFS score is 0. His PHQ-9/GAD score is 9/3.  He is currently on citalopram 40 mg/day and thinks it is working quite well and does not want to change the medicine. Will continue to monitor.    No other concerns at this time.   Past Medical History:  Diagnosis Date   Anxiety    Aortic atherosclerosis (HCC)    Aortic dilatation (HCC) 01/10/2021   a.) CT 01/10/2021: infrarenal aortic dilitation measuring 3.3 x 3.3 cm. b.) CT 06/23/2021: measured 3.4 cm.   Arthritis    B12 deficiency    Basal cell carcinoma 03/14/2019   R nasal tip    Bilateral renal cysts    BPH (benign prostatic hyperplasia)    Bradycardia    Chronic kidney disease    COPD (chronic obstructive pulmonary disease) (HCC)    Coronary artery disease    a.) s/p 2v CABG in 2001; performed while living in [facility] [place].   Diastolic dysfunction 06/2020   a.) TTE 06/2020: EF 60%; sev LA dilation, mild RA dilation; triv PR, mild to mod TR/MR/AR; G1DD.   Frequency of urination    GERD (gastroesophageal reflux disease)    Hiatal hernia    History of kidney stones    HLD (hyperlipidemia)    Hypertension    Internal hemorrhoids    Long term current use of anticoagulant    a.) Rivaroxaban   Melanoma (HCC) 11/11/2022   right infra orbital - MMIS discuss tx options   Mobitz  type 1 second degree AV block    OSA (obstructive sleep apnea)    a.) does not require nocturnal PAP therapy   Osteopenia    Pulmonary HTN (HCC) 06/2020   a.) mild   S/P CABG x 2 2001   a.) details unknown; performed in 2001 in [place].    Past Surgical History:  Procedure Laterality Date   CARDIAC CATHETERIZATION     COLONOSCOPY     CORONARY ARTERY BYPASS GRAFT     CYSTOSCOPY/URETEROSCOPY/HOLMIUM LASER/STENT PLACEMENT Right 11/07/2021   Procedure: CYSTOSCOPY/URETEROSCOPY/HOLMIUM LASER/STENT PLACEMENT;  Surgeon: Sondra Come, MD;  Location: [facility] ORS;  Service: Urology;  Laterality: Right;   FRACTURE SURGERY Left    fractured left arm   HOLEP-LASER ENUCLEATION OF THE PROSTATE WITH MORCELLATION N/A 11/07/2021   Procedure: HOLEP-LASER ENUCLEATION OF THE PROSTATE WITH MORCELLATION;  Surgeon: Sondra Come, MD;  Location: [facility] ORS;  Service: Urology;  Laterality: N/A;   LIPOMA EXCISION N/A 11/21/2018   Procedure: EXCISION BACK LIPOMA;  Surgeon: Griselda Miner, MD;  Location: [facility] OR;  Service: General;  Laterality: N/A;   PILONIDAL CYST EXCISION     SKIN CANCER EXCISION     TOTAL SHOULDER REPLACEMENT Left    UMBILICAL HERNIA REPAIR N/A 11/21/2018  Procedure: HERNIA REPAIR UMBILICAL ADULT WITH MESH;  Surgeon: Griselda Miner, MD;  Location: [facility] OR;  Service: General;  Laterality: N/A;    Social History   Socioeconomic History   Marital status: Widowed    Spouse name: Not on file   Number of children: Not on file   Years of education: Not on file   Highest education level: Not on file  Occupational History   Not on file  Tobacco Use   Smoking status: Former    Current packs/day: 0.00    Average packs/day: 1 pack/day for 20.0 years (20.0 ttl pk-yrs)    Types: Cigarettes    Start date: 09/21/1993    Quit date: 09/21/2013    Years since quitting: 9.9    Passive exposure: Past   Smokeless tobacco: Never   Tobacco comments:    quit on and off over the years  Vaping Use    Vaping status: Never Used  Substance and Sexual Activity   Alcohol use: Yes    Comment: socially   Drug use: No   Sexual activity: Yes    Birth control/protection: None  Other Topics Concern   Not on file  Social History Narrative   Not on file   Social Determinants of Health   Financial Resource Strain: Not on file  Food Insecurity: No Food Insecurity (08/16/2023)   Hunger Vital Sign    Worried About Running Out of Food in the Last Year: Never true    Ran Out of Food in the Last Year: Never true  Transportation Needs: No Transportation Needs (08/16/2023)   PRAPARE - Administrator, Civil Service (Medical): No    Lack of Transportation (Non-Medical): No  Physical Activity: Sufficiently Active (08/16/2023)   Exercise Vital Sign    Days of Exercise per Week: 7 days    Minutes of Exercise per Session: 60 min  Stress: Not on file  Social Connections: Not on file  Intimate Partner Violence: Not on file    Family History  Problem Relation Age of Onset   Prostate cancer Neg Hx    Bladder Cancer Neg Hx    Kidney cancer Neg Hx     No Known Allergies  Outpatient Medications Prior to Visit  Medication Sig Note   aspirin EC 81 MG tablet Take 1 tablet (81 mg total) by mouth daily. Swallow whole.    citalopram (CELEXA) 20 MG tablet Take 1 tablet (20 mg total) by mouth daily. (Patient taking differently: Take 40 mg by mouth daily.)    Coenzyme Q10 (CO Q-10 MAXIMUM STRENGTH PO) Take 1 capsule by mouth daily. MAX Q 10    enalapril (VASOTEC) 20 MG tablet TAKE 1 TABLET BY MOUTH EVERY DAY    fluticasone (FLONASE) 50 MCG/ACT nasal spray Place 2 sprays into both nostrils daily as needed for allergies. 08/16/2023: Needs refill   GEMTESA 75 MG TABS Take 1 tablet by mouth daily.    ondansetron (ZOFRAN) 4 MG tablet Take 1 tablet (4 mg total) by mouth every 8 (eight) hours as needed for nausea or vomiting.    pantoprazole (PROTONIX) 40 MG tablet Take 1 tablet (40 mg total) by mouth  daily.    rosuvastatin (CRESTOR) 10 MG tablet Take 10 mg by mouth daily.    Ferrous Sulfate Dried (SLOW RELEASE IRON) 45 MG TBCR Take 45 mg by mouth daily. (Patient not taking: Reported on 08/16/2023)    imiquimod (ALDARA) 5 % cream Apply topically as directed. Apply to  area on right cheek 5 nights a week for 6 weeks (Patient not taking: Reported on 08/16/2023)    vitamin B-12 (CYANOCOBALAMIN) 500 MCG tablet  (Patient not taking: Reported on 08/16/2023)    No facility-administered medications prior to visit.    Review of Systems  Constitutional: Negative.  Negative for chills, diaphoresis, fever, malaise/fatigue and weight loss.  HENT: Negative.  Negative for congestion, ear discharge, hearing loss, nosebleeds, sinus pain and sore throat.   Eyes: Negative.   Respiratory: Negative.  Negative for cough and shortness of breath.   Cardiovascular: Negative.  Negative for chest pain, palpitations and leg swelling.  Gastrointestinal: Negative.  Negative for abdominal pain, constipation, diarrhea, heartburn, nausea and vomiting.  Genitourinary: Negative.  Negative for dysuria and flank pain.  Musculoskeletal:  Positive for joint pain (left shoulder.). Negative for myalgias.  Skin: Negative.   Neurological:  Negative for dizziness, tingling, tremors, sensory change, speech change, focal weakness and headaches.  Endo/Heme/Allergies: Negative.   Psychiatric/Behavioral: Negative.  Negative for depression and suicidal ideas. The patient is not nervous/anxious.        Objective:   BP (!) 160/90   Pulse 63   Ht 5\' 9"  (1.753 m)   Wt 183 lb 6.4 oz (83.2 kg)   SpO2 97%   BMI 27.08 kg/m   Vitals:   08/16/23 0926  BP: (!) 160/90  Pulse: 63  Height: 5\' 9"  (1.753 m)  Weight: 183 lb 6.4 oz (83.2 kg)  SpO2: 97%  BMI (Calculated): 27.07    Physical Exam Vitals and nursing note reviewed.  Constitutional:      General: He is not in acute distress.    Appearance: Normal appearance.  HENT:      Head: Normocephalic and atraumatic.     Nose: Nose normal.     Mouth/Throat:     Mouth: Mucous membranes are moist.     Pharynx: Oropharynx is clear.  Eyes:     Conjunctiva/sclera: Conjunctivae normal.     Pupils: Pupils are equal, round, and reactive to light.  Cardiovascular:     Rate and Rhythm: Normal rate and regular rhythm.     Pulses: Normal pulses.     Heart sounds: Normal heart sounds.  Pulmonary:     Effort: Pulmonary effort is normal.     Breath sounds: Normal breath sounds. No wheezing, rhonchi or rales.  Abdominal:     General: Bowel sounds are normal.     Palpations: Abdomen is soft. There is no mass.     Tenderness: There is no abdominal tenderness. There is no right CVA tenderness, left CVA tenderness, guarding or rebound.     Hernia: No hernia is present.  Musculoskeletal:        General: Normal range of motion.     Cervical back: Normal range of motion.     Right lower leg: No edema.     Left lower leg: No edema.  Skin:    General: Skin is warm and dry.     Findings: No rash.  Neurological:     General: No focal deficit present.     Mental Status: He is alert and oriented to person, place, and time.  Psychiatric:        Mood and Affect: Mood normal.        Behavior: Behavior normal.        Judgment: Judgment normal.      No results found for any visits on 08/16/23.  No results found for this  or any previous visit (from the past 2160 hour(s)).    Assessment & Plan:  Patient to continue all his medications.  Monitor his blood pressure at home. Fasting labs. Problem List Items Addressed This Visit     Coronary atherosclerosis of autologous vein bypass graft   Relevant Orders   CBC with Diff   Essential hypertension, benign   Relevant Orders   CMP14+EGFR   Hyperlipidemia   Relevant Orders   Lipid Panel w/o Chol/HDL Ratio   Obstructive sleep apnea (adult) (pediatric)   Gastroesophageal reflux disease without esophagitis   Other Visit Diagnoses      Annual wellness visit    -  Primary       Return in about 3 months (around 11/16/2023).   Total time spent: 30 minutes  Margaretann Loveless, MD  08/16/2023   This document may have been prepared by [facility] Voice Recognition software and as such may include unintentional dictation errors.

## 2023-08-23 ENCOUNTER — Other Ambulatory Visit: Payer: Medicare HMO

## 2023-08-23 DIAGNOSIS — I25810 Atherosclerosis of coronary artery bypass graft(s) without angina pectoris: Secondary | ICD-10-CM | POA: Diagnosis not present

## 2023-08-23 DIAGNOSIS — E782 Mixed hyperlipidemia: Secondary | ICD-10-CM | POA: Diagnosis not present

## 2023-08-23 DIAGNOSIS — I10 Essential (primary) hypertension: Secondary | ICD-10-CM | POA: Diagnosis not present

## 2023-08-24 ENCOUNTER — Ambulatory Visit: Payer: Medicare HMO

## 2023-08-24 LAB — CMP14+EGFR
ALT: 24 [IU]/L (ref 0–44)
AST: 30 [IU]/L (ref 0–40)
Albumin: 4.4 g/dL (ref 3.7–4.7)
Alkaline Phosphatase: 86 [IU]/L (ref 44–121)
BUN/Creatinine Ratio: 21 (ref 10–24)
BUN: 19 mg/dL (ref 8–27)
Bilirubin Total: 0.8 mg/dL (ref 0.0–1.2)
CO2: 25 mmol/L (ref 20–29)
Calcium: 10 mg/dL (ref 8.6–10.2)
Chloride: 107 mmol/L — ABNORMAL HIGH (ref 96–106)
Creatinine, Ser: 0.92 mg/dL (ref 0.76–1.27)
Globulin, Total: 2.2 g/dL (ref 1.5–4.5)
Glucose: 99 mg/dL (ref 70–99)
Potassium: 4.8 mmol/L (ref 3.5–5.2)
Sodium: 143 mmol/L (ref 134–144)
Total Protein: 6.6 g/dL (ref 6.0–8.5)
eGFR: 82 mL/min/{1.73_m2} (ref >59)

## 2023-08-24 LAB — CBC WITH DIFFERENTIAL/PLATELET
Basophils Absolute: 0.1 10*3/uL (ref 0.0–0.2)
Basos: 1 %
EOS (ABSOLUTE): 0.1 10*3/uL (ref 0.0–0.4)
Eos: 2 %
Hematocrit: 43.4 % (ref 37.5–51.0)
Hemoglobin: 14.1 g/dL (ref 13.0–17.7)
Immature Grans (Abs): 0 10*3/uL (ref 0.0–0.1)
Immature Granulocytes: 0 %
Lymphocytes Absolute: 1.3 10*3/uL (ref 0.7–3.1)
Lymphs: 25 %
MCH: 32 pg (ref 26.6–33.0)
MCHC: 32.5 g/dL (ref 31.5–35.7)
MCV: 99 fL — ABNORMAL HIGH (ref 79–97)
Monocytes Absolute: 0.5 10*3/uL (ref 0.1–0.9)
Monocytes: 9 %
Neutrophils Absolute: 3.4 10*3/uL (ref 1.4–7.0)
Neutrophils: 63 %
Platelets: 162 10*3/uL (ref 150–450)
RBC: 4.4 x10E6/uL (ref 4.14–5.80)
RDW: 12.9 % (ref 11.6–15.4)
WBC: 5.4 10*3/uL (ref 3.4–10.8)

## 2023-08-24 LAB — LIPID PANEL W/O CHOL/HDL RATIO
Cholesterol, Total: 139 mg/dL (ref 100–199)
HDL: 68 mg/dL (ref >39)
LDL Chol Calc (NIH): 57 mg/dL (ref 0–99)
Triglycerides: 73 mg/dL (ref 0–149)
VLDL Cholesterol Cal: 14 mg/dL (ref 5–40)

## 2023-08-30 ENCOUNTER — Ambulatory Visit: Payer: Medicare HMO | Admitting: Cardiovascular Disease

## 2023-08-31 ENCOUNTER — Ambulatory Visit: Payer: Medicare HMO

## 2023-09-02 ENCOUNTER — Ambulatory Visit: Payer: Medicare HMO | Admitting: Dermatology

## 2023-09-02 ENCOUNTER — Encounter: Payer: Self-pay | Admitting: Dermatology

## 2023-09-02 DIAGNOSIS — Z86006 Personal history of melanoma in-situ: Secondary | ICD-10-CM | POA: Diagnosis not present

## 2023-09-02 DIAGNOSIS — Z1283 Encounter for screening for malignant neoplasm of skin: Secondary | ICD-10-CM | POA: Diagnosis not present

## 2023-09-02 DIAGNOSIS — D1801 Hemangioma of skin and subcutaneous tissue: Secondary | ICD-10-CM | POA: Diagnosis not present

## 2023-09-02 DIAGNOSIS — L578 Other skin changes due to chronic exposure to nonionizing radiation: Secondary | ICD-10-CM

## 2023-09-02 DIAGNOSIS — L821 Other seborrheic keratosis: Secondary | ICD-10-CM

## 2023-09-02 DIAGNOSIS — L814 Other melanin hyperpigmentation: Secondary | ICD-10-CM

## 2023-09-02 DIAGNOSIS — Z85820 Personal history of malignant melanoma of skin: Secondary | ICD-10-CM

## 2023-09-02 DIAGNOSIS — L820 Inflamed seborrheic keratosis: Secondary | ICD-10-CM | POA: Diagnosis not present

## 2023-09-02 DIAGNOSIS — Z85828 Personal history of other malignant neoplasm of skin: Secondary | ICD-10-CM

## 2023-09-02 DIAGNOSIS — L570 Actinic keratosis: Secondary | ICD-10-CM

## 2023-09-02 DIAGNOSIS — D229 Melanocytic nevi, unspecified: Secondary | ICD-10-CM

## 2023-09-02 DIAGNOSIS — W908XXA Exposure to other nonionizing radiation, initial encounter: Secondary | ICD-10-CM

## 2023-09-02 NOTE — Progress Notes (Signed)
Follow-Up Visit   Subjective  James [name] is a 87 y.o. male who presents for the following: Skin Cancer Screening and Full Body Skin Exam  The patient presents for Total-Body Skin Exam (TBSE) for skin cancer screening and mole check. The patient has spots, moles and lesions to be evaluated, some may be new or changing and the patient may have concern these could be cancer.  The following portions of the chart were reviewed this encounter and updated as appropriate: medications, allergies, medical history  Review of Systems:  No other skin or systemic complaints except as noted in HPI or Assessment and Plan.  Objective  Well appearing patient in no apparent distress; mood and affect are within normal limits.  A full examination was performed including scalp, head, eyes, ears, nose, lips, neck, chest, axillae, abdomen, back, buttocks, bilateral upper extremities, bilateral lower extremities, hands, feet, fingers, toes, fingernails, and toenails. All findings within normal limits unless otherwise noted below.   Relevant physical exam findings are noted in the Assessment and Plan.  face, scalp, ears, hands, and arms x 16 (16) Pink scaly macules face, scalp, arms, hands x 16 (16) Erythematous stuck-on, waxy papule or plaqueErythematous stuck-on, waxy papule or plaque  Assessment & Plan   SKIN CANCER SCREENING PERFORMED TODAY.  ACTINIC DAMAGE - Chronic condition, secondary to cumulative UV/sun exposure - diffuse scaly erythematous macules with underlying dyspigmentation - Recommend daily broad spectrum sunscreen SPF 30+ to sun-exposed areas, reapply every 2 hours as needed.  - Staying in the shade or wearing long sleeves, sun glasses (UVA+UVB protection) and wide brim hats (4-inch brim around the entire circumference of the hat) are also recommended for sun protection.  - Call for new or changing lesions.  LENTIGINES, SEBORRHEIC KERATOSES, HEMANGIOMAS - Benign normal skin  lesions - Benign-appearing - Call for any changes  MELANOCYTIC NEVI - Tan-brown and/or pink-flesh-colored symmetric macules and papules - Benign appearing on exam today - Observation - Call clinic for new or changing moles - Recommend daily use of broad spectrum spf 30+ sunscreen to sun-exposed areas.   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  - Call if any new or changing lesions are noted between office visits  HISTORY OF BASAL CELL CARCINOMA OF THE SKIN - No evidence of recurrence today - Recommend regular full body skin exams - Recommend daily broad spectrum sunscreen SPF 30+ to sun-exposed areas, reapply every 2 hours as needed.  - Call if any new or changing lesions are noted between office visits AK (ACTINIC KERATOSIS) (16) face, scalp, ears, hands, and arms x 16 (16) Actinic keratoses are precancerous spots that appear secondary to cumulative UV radiation exposure/sun exposure over time. They are chronic with expected duration over 1 year. A portion of actinic keratoses will progress to squamous cell carcinoma of the skin. It is not possible to reliably predict which spots will progress to skin cancer and so treatment is recommended to prevent development of skin cancer.  Recommend daily broad spectrum sunscreen SPF 30+ to sun-exposed areas, reapply every 2 hours as needed.  Recommend staying in the shade or wearing long sleeves, sun glasses (UVA+UVB protection) and wide brim hats (4-inch brim around the entire circumference of the hat). Call for new  or changing lesions. Destruction of lesion - face, scalp, ears, hands, and arms x 16 (16) Complexity: simple   Destruction method: cryotherapy   Informed  consent: discussed and consent obtained   Timeout:  patient name, date of birth, surgical site, and procedure verified Lesion destroyed using liquid nitrogen: Yes   Region frozen until ice ball extended beyond lesion: Yes   Outcome: patient tolerated procedure well with no complications   Post-procedure details: wound care instructions given   INFLAMED SEBORRHEIC KERATOSIS (16) face, scalp, arms, hands x 16 (16) Symptomatic, irritating, patient would like treated.  Destruction of lesion - face, scalp, arms, hands x 16 (16) Complexity: simple   Destruction method: cryotherapy   Informed consent: discussed and consent obtained   Timeout:  patient name, date of birth, surgical site, and procedure verified Lesion destroyed using liquid nitrogen: Yes   Region frozen until ice ball extended beyond lesion: Yes   Outcome: patient tolerated procedure well with no complications   Post-procedure details: wound care instructions given    Return in about 3 months (around 12/01/2023) for TBSE.  Maylene Roes, CMA, am acting as scribe for Armida Sans, MD .   Documentation: I have reviewed the above documentation for accuracy and completeness, and I agree with the above.  Armida Sans, MD

## 2023-09-02 NOTE — Patient Instructions (Signed)

## 2023-09-07 ENCOUNTER — Other Ambulatory Visit: Payer: Self-pay | Admitting: Internal Medicine

## 2023-09-07 ENCOUNTER — Ambulatory Visit: Payer: Medicare HMO

## 2023-09-07 DIAGNOSIS — I25810 Atherosclerosis of coronary artery bypass graft(s) without angina pectoris: Secondary | ICD-10-CM

## 2023-09-07 DIAGNOSIS — E782 Mixed hyperlipidemia: Secondary | ICD-10-CM

## 2023-09-10 ENCOUNTER — Encounter: Payer: Self-pay | Admitting: Dermatology

## 2023-09-14 ENCOUNTER — Ambulatory Visit: Payer: Medicare HMO

## 2023-09-21 ENCOUNTER — Ambulatory Visit: Payer: Medicare HMO

## 2023-09-27 ENCOUNTER — Ambulatory Visit: Payer: Medicare HMO

## 2023-10-04 ENCOUNTER — Ambulatory Visit: Payer: Medicare HMO

## 2023-10-11 ENCOUNTER — Ambulatory Visit: Payer: Medicare HMO

## 2023-10-18 ENCOUNTER — Ambulatory Visit: Payer: Medicare HMO

## 2023-10-25 ENCOUNTER — Ambulatory Visit: Payer: Medicare HMO

## 2023-10-30 ENCOUNTER — Other Ambulatory Visit: Payer: Self-pay | Admitting: Cardiovascular Disease

## 2023-10-30 DIAGNOSIS — I10 Essential (primary) hypertension: Secondary | ICD-10-CM

## 2023-11-01 ENCOUNTER — Ambulatory Visit: Payer: Medicare HMO

## 2023-11-08 ENCOUNTER — Ambulatory Visit: Payer: Medicare HMO

## 2023-11-15 ENCOUNTER — Ambulatory Visit: Payer: Medicare HMO

## 2023-11-16 ENCOUNTER — Ambulatory Visit (INDEPENDENT_AMBULATORY_CARE_PROVIDER_SITE_OTHER): Payer: HMO | Admitting: Cardiology

## 2023-11-16 ENCOUNTER — Encounter: Payer: Self-pay | Admitting: Cardiology

## 2023-11-16 VITALS — BP 132/82 | HR 73 | Ht 69.0 in | Wt 182.0 lb

## 2023-11-16 DIAGNOSIS — H6123 Impacted cerumen, bilateral: Secondary | ICD-10-CM | POA: Insufficient documentation

## 2023-11-16 DIAGNOSIS — I10 Essential (primary) hypertension: Secondary | ICD-10-CM

## 2023-11-16 DIAGNOSIS — E782 Mixed hyperlipidemia: Secondary | ICD-10-CM

## 2023-11-16 DIAGNOSIS — H9193 Unspecified hearing loss, bilateral: Secondary | ICD-10-CM | POA: Insufficient documentation

## 2023-11-16 DIAGNOSIS — R2681 Unsteadiness on feet: Secondary | ICD-10-CM | POA: Diagnosis not present

## 2023-11-16 DIAGNOSIS — Z872 Personal history of diseases of the skin and subcutaneous tissue: Secondary | ICD-10-CM

## 2023-11-16 NOTE — Progress Notes (Signed)
 Established Patient Office Visit  Subjective:  Patient ID: James [name], male    DOB: [birth year]  Age: 88 y.o. MRN: [id number]  Chief Complaint  Patient presents with   Follow-up    3 Months Follow Up    Patient in office for 3 month follow up. Patient doing well. States he has fallen multiple times over the past year. Patient requesting a home check for safety. Referral placed for home health.  Patient complains of decreased hearing. Bilateral cerumen impaction. Ear irrigation today. Patient requesting a referral to ENT for hearing test.     No other concerns at this time.   Past Medical History:  Diagnosis Date   Anxiety    Aortic atherosclerosis (HCC)    Aortic dilatation (HCC) 01/10/2021   a.) CT 01/10/2021: infrarenal aortic dilitation measuring 3.3 x 3.3 cm. b.) CT 06/23/2021: measured 3.4 cm.   Arthritis    B12 deficiency    Basal cell carcinoma 03/14/2019   R nasal tip    Bilateral renal cysts    BPH (benign prostatic hyperplasia)    Bradycardia    Chronic kidney disease    COPD (chronic obstructive pulmonary disease) (HCC)    Coronary artery disease    a.) s/p 2v CABG in 2001; performed while living in [facility] [place].   Diastolic dysfunction 06/2020   a.) TTE 06/2020: EF 60%; sev LA dilation, mild RA dilation; triv PR, mild to mod TR/MR/AR; G1DD.   Frequency of urination    GERD (gastroesophageal reflux disease)    Hiatal hernia    History of kidney stones    HLD (hyperlipidemia)    Hypertension    Internal hemorrhoids    Long term current use of anticoagulant    a.) Rivaroxaban   Melanoma (HCC) 11/11/2022   right infra orbital - Liquid nitrogen destruction 11/17/22 Imiquimod cream started 12/30/22 used for ~6wks   Mobitz type 1 second degree AV block    OSA (obstructive sleep apnea)    a.) does not require nocturnal PAP therapy   Osteopenia    Pulmonary HTN (HCC) 06/2020   a.) mild   S/P CABG x 2 2001   a.) details unknown; performed in  2001 in [place].    Past Surgical History:  Procedure Laterality Date   CARDIAC CATHETERIZATION     COLONOSCOPY     CORONARY ARTERY BYPASS GRAFT     CYSTOSCOPY/URETEROSCOPY/HOLMIUM LASER/STENT PLACEMENT Right 11/07/2021   Procedure: CYSTOSCOPY/URETEROSCOPY/HOLMIUM LASER/STENT PLACEMENT;  Surgeon: Sondra Come, MD;  Location: [facility] OR[facility];  Service: Urology;  Laterality: Right;   FRACTURE SURGERY Left    fractured left arm   HOLEP-LASER ENUCLEATION OF THE PROSTATE WITH MORCELLATION N/A 11/07/2021   Procedure: HOLEP-LASER ENUCLEATION OF THE PROSTATE WITH MORCELLATION;  Surgeon: Sondra Come, MD;  Location: [facility];  Service: Urology;  Laterality: N/A;   LIPOMA EXCISION N/A 11/21/2018   Procedure: EXCISION BACK LIPOMA;  Surgeon: Griselda Miner, MD;  Location: [facility] OR;  Service: General;  Laterality: N/A;   PILONIDAL CYST EXCISION     SKIN CANCER EXCISION     TOTAL SHOULDER REPLACEMENT Left    UMBILICAL HERNIA REPAIR N/A 11/21/2018   Procedure: HERNIA REPAIR UMBILICAL ADULT WITH MESH;  Surgeon: Griselda Miner, MD;  Location: [facility] OR;  Service: General;  Laterality: N/A;    Social History   Socioeconomic History   Marital status: Widowed    Spouse name: Not on file   Number of children: Not on file  Years of education: Not on file   Highest education level: Not on file  Occupational History   Not on file  Tobacco Use   Smoking status: Former    Current packs/day: 0.00    Average packs/day: 1 pack/day for 20.0 years (20.0 ttl pk-yrs)    Types: Cigarettes    Start date: 09/21/1993    Quit date: 09/21/2013    Years since quitting: 10.1    Passive exposure: Past   Smokeless tobacco: Never   Tobacco comments:    quit on and off over the years  Vaping Use   Vaping status: Never Used  Substance and Sexual Activity   Alcohol use: Yes    Comment: socially   Drug use: No   Sexual activity: Yes    Birth control/protection: None  Other Topics Concern   Not on file  Social History  Narrative   Not on file   Social Drivers of Health   Financial Resource Strain: Not on file  Food Insecurity: No Food Insecurity (08/16/2023)   Hunger Vital Sign    Worried About Running Out of Food in the Last Year: Never true    Ran Out of Food in the Last Year: Never true  Transportation Needs: No Transportation Needs (08/16/2023)   PRAPARE - Administrator, Civil Service (Medical): No    Lack of Transportation (Non-Medical): No  Physical Activity: Sufficiently Active (08/16/2023)   Exercise Vital Sign    Days of Exercise per Week: 7 days    Minutes of Exercise per Session: 60 min  Stress: Not on file  Social Connections: Not on file  Intimate Partner Violence: Not on file    Family History  Problem Relation Age of Onset   Prostate cancer Neg Hx    Bladder Cancer Neg Hx    Kidney cancer Neg Hx     No Known Allergies  Outpatient Medications Prior to Visit  Medication Sig   aspirin EC 81 MG tablet Take 1 tablet (81 mg total) by mouth daily. Swallow whole.   citalopram (CELEXA) 20 MG tablet Take 1 tablet (20 mg total) by mouth daily. (Patient taking differently: Take 40 mg by mouth daily.)   Coenzyme Q10 (CO Q-10 MAXIMUM STRENGTH PO) Take 1 capsule by mouth daily. MAX Q 10   enalapril (VASOTEC) 20 MG tablet TAKE 1 TABLET BY MOUTH EVERY DAY   Ferrous Sulfate Dried (SLOW RELEASE IRON) 45 MG TBCR Take 45 mg by mouth daily. (Patient not taking: Reported on 08/16/2023)   fluticasone (FLONASE) 50 MCG/ACT nasal spray Place 2 sprays into both nostrils daily as needed for allergies.   GEMTESA 75 MG TABS Take 1 tablet by mouth daily.   imiquimod (ALDARA) 5 % cream Apply topically as directed. Apply to area on right cheek 5 nights a week for 6 weeks (Patient not taking: Reported on 09/02/2023)   ondansetron (ZOFRAN) 4 MG tablet Take 1 tablet (4 mg total) by mouth every 8 (eight) hours as needed for nausea or vomiting.   pantoprazole (PROTONIX) 40 MG tablet Take 1 tablet (40  mg total) by mouth daily.   rosuvastatin (CRESTOR) 10 MG tablet TAKE 1 TABLET BY MOUTH EVERY DAY   vitamin B-12 (CYANOCOBALAMIN) 500 MCG tablet  (Patient not taking: Reported on 09/02/2023)   No facility-administered medications prior to visit.    Review of Systems  Constitutional: Negative.   HENT:  Positive for hearing loss.   Eyes: Negative.   Respiratory: Negative.  Negative  for shortness of breath.   Cardiovascular: Negative.  Negative for chest pain.  Gastrointestinal: Negative.  Negative for abdominal pain, constipation and diarrhea.  Genitourinary: Negative.   Musculoskeletal:  Negative for joint pain and myalgias.  Skin: Negative.   Neurological: Negative.  Negative for dizziness and headaches.  Endo/Heme/Allergies: Negative.   All other systems reviewed and are negative.      Objective:   BP 132/82 (BP Location: Right Arm, Patient Position: Sitting, Cuff Size: Large)   Pulse 73   Ht 5\' 9"  (1.753 m)   Wt 182 lb (82.6 kg)   SpO2 96%   BMI 26.88 kg/m   Vitals:   11/16/23 0948 11/16/23 1003  BP: (!) 146/82 132/82  Pulse: 73   Height: 5\' 9"  (1.753 m)   Weight: 182 lb (82.6 kg)   SpO2: 96%   BMI (Calculated): 26.86     Physical Exam Nursing note reviewed.  Constitutional:      Appearance: Normal appearance. He is normal weight.  HENT:     Head: Normocephalic and atraumatic.     Nose: Nose normal.     Mouth/Throat:     Mouth: Mucous membranes are moist.     Pharynx: Oropharynx is clear.  Eyes:     Extraocular Movements: Extraocular movements intact.     Conjunctiva/sclera: Conjunctivae normal.     Pupils: Pupils are equal, round, and reactive to light.  Cardiovascular:     Rate and Rhythm: Normal rate and regular rhythm.     Pulses: Normal pulses.     Heart sounds: Normal heart sounds.  Pulmonary:     Effort: Pulmonary effort is normal.     Breath sounds: Normal breath sounds.  Abdominal:     General: Abdomen is flat. Bowel sounds are normal.      Palpations: Abdomen is soft.  Musculoskeletal:        General: Normal range of motion.     Cervical back: Normal range of motion.  Skin:    General: Skin is warm and dry.  Neurological:     General: No focal deficit present.     Mental Status: He is alert and oriented to person, place, and time.  Psychiatric:        Mood and Affect: Mood normal.        Behavior: Behavior normal.        Thought Content: Thought content normal.        Judgment: Judgment normal.      No results found for any visits on 11/16/23.  Recent Results (from the past 2160 hours)  CMP14+EGFR     Status: Abnormal   Collection Time: 08/23/23 10:21 AM  Result Value Ref Range   Glucose 99 70 - 99 mg/dL   BUN 19 8 - 27 mg/dL   Creatinine, Ser 1.47 0.76 - 1.27 mg/dL   eGFR 82 >82 NF/AOZ/3.08   BUN/Creatinine Ratio 21 10 - 24   Sodium 143 134 - 144 mmol/L   Potassium 4.8 3.5 - 5.2 mmol/L   Chloride 107 (H) 96 - 106 mmol/L   CO2 25 20 - 29 mmol/L   Calcium 10.0 8.6 - 10.2 mg/dL   Total Protein 6.6 6.0 - 8.5 g/dL   Albumin 4.4 3.7 - 4.7 g/dL   Globulin, Total 2.2 1.5 - 4.5 g/dL   Bilirubin Total 0.8 0.0 - 1.2 mg/dL   Alkaline Phosphatase 86 44 - 121 IU/L   AST 30 0 - 40 IU/L   ALT 24  0 - 44 IU/L  CBC with Diff     Status: Abnormal   Collection Time: 08/23/23 10:21 AM  Result Value Ref Range   WBC 5.4 3.4 - 10.8 x10E3/uL   RBC 4.40 4.14 - 5.80 x10E6/uL   Hemoglobin 14.1 13.0 - 17.7 g/dL   Hematocrit 81.1 91.4 - 51.0 %   MCV 99 (H) 79 - 97 fL   MCH 32.0 26.6 - 33.0 pg   MCHC 32.5 31.5 - 35.7 g/dL   RDW 78.2 95.6 - 21.3 %   Platelets 162 150 - 450 x10E3/uL   Neutrophils 63 Not Estab. %   Lymphs 25 Not Estab. %   Monocytes 9 Not Estab. %   Eos 2 Not Estab. %   Basos 1 Not Estab. %   Neutrophils Absolute 3.4 1.4 - 7.0 x10E3/uL   Lymphocytes Absolute 1.3 0.7 - 3.1 x10E3/uL   Monocytes Absolute 0.5 0.1 - 0.9 x10E3/uL   EOS (ABSOLUTE) 0.1 0.0 - 0.4 x10E3/uL   Basophils Absolute 0.1 0.0 - 0.2 x10E3/uL    Immature Granulocytes 0 Not Estab. %   Immature Grans (Abs) 0.0 0.0 - 0.1 x10E3/uL  Lipid Panel w/o Chol/HDL Ratio     Status: None   Collection Time: 08/23/23 10:21 AM  Result Value Ref Range   Cholesterol, Total 139 100 - 199 mg/dL   Triglycerides 73 0 - 149 mg/dL   HDL 68 >08 mg/dL   VLDL Cholesterol Cal 14 5 - 40 mg/dL   LDL Chol Calc (NIH) 57 0 - 99 mg/dL      Assessment & Plan:  Ear irrigation today Referral to home health placed Referral to ENT placed for hearing loss.   Problem List Items Addressed This Visit       Cardiovascular and Mediastinum   Essential hypertension, benign     Nervous and Auditory   Bilateral impacted cerumen   Relevant Orders   Ambulatory referral to ENT   Bilateral hearing loss   Relevant Orders   Ambulatory referral to ENT     Other   Hyperlipidemia   H/O pilonidal cyst   Unsteady gait when walking - Primary   Relevant Orders   Ambulatory referral to Home Health    Return in about 3 months (around 02/13/2024) for with NK.   Total time spent: 25 minutes  [employer], NP  11/16/2023   This document may have been prepared by Dragon Voice Recognition software and as such may include unintentional dictation errors.

## 2023-11-19 DIAGNOSIS — E663 Overweight: Secondary | ICD-10-CM | POA: Diagnosis not present

## 2023-11-19 DIAGNOSIS — F325 Major depressive disorder, single episode, in full remission: Secondary | ICD-10-CM | POA: Diagnosis not present

## 2023-11-19 DIAGNOSIS — R32 Unspecified urinary incontinence: Secondary | ICD-10-CM | POA: Diagnosis not present

## 2023-11-19 DIAGNOSIS — Z87891 Personal history of nicotine dependence: Secondary | ICD-10-CM | POA: Diagnosis not present

## 2023-11-19 DIAGNOSIS — K219 Gastro-esophageal reflux disease without esophagitis: Secondary | ICD-10-CM | POA: Diagnosis not present

## 2023-11-19 DIAGNOSIS — I10 Essential (primary) hypertension: Secondary | ICD-10-CM | POA: Diagnosis not present

## 2023-11-19 DIAGNOSIS — E785 Hyperlipidemia, unspecified: Secondary | ICD-10-CM | POA: Diagnosis not present

## 2023-11-22 ENCOUNTER — Ambulatory Visit: Payer: Medicare HMO

## 2023-11-22 DIAGNOSIS — H903 Sensorineural hearing loss, bilateral: Secondary | ICD-10-CM | POA: Diagnosis not present

## 2023-12-02 ENCOUNTER — Ambulatory Visit: Payer: HMO | Admitting: Cardiovascular Disease

## 2023-12-09 ENCOUNTER — Ambulatory Visit: Payer: Medicare HMO | Admitting: Dermatology

## 2023-12-21 ENCOUNTER — Ambulatory Visit: Admitting: Dermatology

## 2023-12-21 ENCOUNTER — Encounter: Payer: Self-pay | Admitting: Dermatology

## 2023-12-21 DIAGNOSIS — L814 Other melanin hyperpigmentation: Secondary | ICD-10-CM | POA: Diagnosis not present

## 2023-12-21 DIAGNOSIS — L578 Other skin changes due to chronic exposure to nonionizing radiation: Secondary | ICD-10-CM | POA: Diagnosis not present

## 2023-12-21 DIAGNOSIS — Z85820 Personal history of malignant melanoma of skin: Secondary | ICD-10-CM

## 2023-12-21 DIAGNOSIS — W908XXA Exposure to other nonionizing radiation, initial encounter: Secondary | ICD-10-CM

## 2023-12-21 DIAGNOSIS — L570 Actinic keratosis: Secondary | ICD-10-CM | POA: Diagnosis not present

## 2023-12-21 DIAGNOSIS — D229 Melanocytic nevi, unspecified: Secondary | ICD-10-CM

## 2023-12-21 DIAGNOSIS — L821 Other seborrheic keratosis: Secondary | ICD-10-CM | POA: Diagnosis not present

## 2023-12-21 DIAGNOSIS — Z86006 Personal history of melanoma in-situ: Secondary | ICD-10-CM | POA: Diagnosis not present

## 2023-12-21 DIAGNOSIS — Z1283 Encounter for screening for malignant neoplasm of skin: Secondary | ICD-10-CM

## 2023-12-21 DIAGNOSIS — D1801 Hemangioma of skin and subcutaneous tissue: Secondary | ICD-10-CM | POA: Diagnosis not present

## 2023-12-21 DIAGNOSIS — Z7189 Other specified counseling: Secondary | ICD-10-CM | POA: Diagnosis not present

## 2023-12-21 DIAGNOSIS — L820 Inflamed seborrheic keratosis: Secondary | ICD-10-CM

## 2023-12-21 DIAGNOSIS — Z85828 Personal history of other malignant neoplasm of skin: Secondary | ICD-10-CM | POA: Diagnosis not present

## 2023-12-21 NOTE — Progress Notes (Signed)
 Follow-Up Visit   Subjective  James [name] is a 88 y.o. male who presents for the following: Skin Cancer Screening and Full Body Skin Exam - History of Melanoma in situ, BCC  The patient presents for Total-Body Skin Exam (TBSE) for skin cancer screening and mole check. The patient has spots, moles and lesions to be evaluated, some may be new or changing and the patient may have concern these could be cancer.  The following portions of the chart were reviewed this encounter and updated as appropriate: medications, allergies, medical history  Review of Systems:  No other skin or systemic complaints except as noted in HPI or Assessment and Plan.  Objective  Well appearing patient in no apparent distress; mood and affect are within normal limits.  A full examination was performed including scalp, head, eyes, ears, nose, lips, neck, chest, axillae, abdomen, back, buttocks, bilateral upper extremities, bilateral lower extremities, hands, feet, fingers, toes, fingernails, and toenails. All findings within normal limits unless otherwise noted below.   Relevant physical exam findings are noted in the Assessment and Plan.  Scalp, left ear (17) Erythematous thin papules/macules with gritty scale.  Right Ear, left neck, right lat bicep (3) Erythematous stuck-on, waxy papule or plaque  Assessment & Plan   SKIN CANCER SCREENING PERFORMED TODAY.  ACTINIC DAMAGE - Chronic condition, secondary to cumulative UV/sun exposure - diffuse scaly erythematous macules with underlying dyspigmentation - Recommend daily broad spectrum sunscreen SPF 30+ to sun-exposed areas, reapply every 2 hours as needed.  - Staying in the shade or wearing long sleeves, sun glasses (UVA+UVB protection) and wide brim hats (4-inch brim around the entire circumference of the hat) are also recommended for sun protection.  - Call for new or changing lesions.  LENTIGINES, SEBORRHEIC KERATOSES, HEMANGIOMAS - Benign normal  skin lesions - Benign-appearing - Call for any changes  MELANOCYTIC NEVI - Tan-brown and/or pink-flesh-colored symmetric macules and papules - Benign appearing on exam today - Observation - Call clinic for new or changing moles - Recommend daily use of broad spectrum spf 30+ sunscreen to sun-exposed areas.   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  He understands he may need further surgical procedure if this treatment does not work.  At this point with the area being clear on exam today, the patient declines any further evaluation or treatment.  We will continue to examine and observe for recurrence. - No evidence of recurrence today - Recommend regular full body skin exams - Recommend daily broad spectrum sunscreen SPF 30+ to sun-exposed areas, reapply every 2 hours as needed.  - Call if any new or changing lesions are noted between office visits  HISTORY OF BASAL CELL CARCINOMA OF THE SKIN - No evidence of recurrence today - Recommend regular full body skin exams - Recommend daily broad spectrum sunscreen SPF 30+ to sun-exposed areas, reapply every 2 hours as needed.  - Call if any new or changing lesions are noted between office visits  AK (ACTINIC KERATOSIS) (17) Scalp, left ear (17) Destruction of lesion - Scalp, left ear (17) Complexity: simple   Destruction method: cryotherapy   Informed consent: discussed and consent obtained   Timeout:  patient  name, date of birth, surgical site, and procedure verified Lesion destroyed using liquid nitrogen: Yes   Region frozen until ice ball extended beyond lesion: Yes   Outcome: patient tolerated procedure well with no complications   Post-procedure details: wound care instructions given   INFLAMED SEBORRHEIC KERATOSIS (3) Right Ear, left neck, right lat bicep (3) Symptomatic, irritating, patient would like treated.  Benign-appearing.  Call clinic for new or changing lesions.   Destruction of lesion - Right Ear, left neck, right lat bicep (3) Complexity: simple   Destruction method: cryotherapy   Informed consent: discussed and consent obtained   Timeout:  patient name, date of birth, surgical site, and procedure verified Lesion destroyed using liquid nitrogen: Yes   Region frozen until ice ball extended beyond lesion: Yes   Outcome: patient tolerated procedure well with no complications   Post-procedure details: wound care instructions given   SKIN CANCER SCREENING   ACTINIC SKIN DAMAGE   HISTORY OF MALIGNANT MELANOMA   HISTORY OF BASAL CELL CARCINOMA   LENTIGO   MELANOCYTIC NEVUS, UNSPECIFIED LOCATION   COUNSELING AND COORDINATION OF CARE     Return today (on 12/21/2023) for 4-6 months TBSE, History of Melanoma.  I, Joanie Coddington, CMA, am acting as scribe for Armida Sans, MD .   Documentation: I have reviewed the above documentation for accuracy and completeness, and I agree with the above.  Armida Sans, MD

## 2023-12-21 NOTE — Patient Instructions (Signed)

## 2023-12-28 ENCOUNTER — Encounter: Payer: Self-pay | Admitting: Internal Medicine

## 2023-12-28 ENCOUNTER — Ambulatory Visit (INDEPENDENT_AMBULATORY_CARE_PROVIDER_SITE_OTHER): Admitting: Internal Medicine

## 2023-12-28 VITALS — BP 138/84 | HR 64 | Ht 69.0 in | Wt 184.0 lb

## 2023-12-28 DIAGNOSIS — E782 Mixed hyperlipidemia: Secondary | ICD-10-CM

## 2023-12-28 DIAGNOSIS — R413 Other amnesia: Secondary | ICD-10-CM | POA: Diagnosis not present

## 2023-12-28 DIAGNOSIS — K219 Gastro-esophageal reflux disease without esophagitis: Secondary | ICD-10-CM

## 2023-12-28 DIAGNOSIS — R2681 Unsteadiness on feet: Secondary | ICD-10-CM

## 2023-12-28 DIAGNOSIS — I25810 Atherosclerosis of coronary artery bypass graft(s) without angina pectoris: Secondary | ICD-10-CM | POA: Diagnosis not present

## 2023-12-28 DIAGNOSIS — I10 Essential (primary) hypertension: Secondary | ICD-10-CM

## 2023-12-28 MED ORDER — DONEPEZIL HCL 10 MG PO TABS
10.0000 mg | ORAL_TABLET | Freq: Every day | ORAL | 2 refills | Status: DC
Start: 2023-12-28 — End: 2024-02-10

## 2023-12-28 NOTE — Progress Notes (Signed)
 Established Patient Office Visit  Subjective:  Patient ID: James [name], male    DOB: 12-30-1935  Age: 88 y.o. MRN: [id number]  Chief Complaint  Patient presents with   Extremity Weakness    Patient comes in for his follow-up today.  He admits to being somewhat fatigued and tired more frequently.  He is taking all his medications but he is becoming more forgetful.  Patient advised to bring in all his bottles at his next visit which will be in 2 weeks.  He has been seen by the audiologist and is somewhat confused about his options for hearing aids.  Will contact their office to get more clarification today.  He has been having some trouble with his gait and has completed physical therapy in the past.  Will set up home health with PT and OT.  Also will add Aricept to his regimen.   Patient will get  labs today.    No other concerns at this time.   Past Medical History:  Diagnosis Date   Anxiety    Aortic atherosclerosis (HCC)    Aortic dilatation (HCC) 01/10/2021   a.) CT 01/10/2021: infrarenal aortic dilitation measuring 3.3 x 3.3 cm. b.) CT 06/23/2021: measured 3.4 cm.   Arthritis    B12 deficiency    Basal cell carcinoma 03/14/2019   R nasal tip    Bilateral renal cysts    BPH (benign prostatic hyperplasia)    Bradycardia    Chronic kidney disease    COPD (chronic obstructive pulmonary disease) (HCC)    Coronary artery disease    a.) s/p 2v CABG in 2001; performed while living in [facility] [place].   Diastolic dysfunction 06/2020   a.) TTE 06/2020: EF 60%; sev LA dilation, mild RA dilation; triv PR, mild to mod TR/MR/AR; G1DD.   Frequency of urination    GERD (gastroesophageal reflux disease)    Hiatal hernia    History of kidney stones    HLD (hyperlipidemia)    Hypertension    Internal hemorrhoids    Long term current use of anticoagulant    a.) Rivaroxaban   Melanoma (HCC) 11/11/2022   right infra orbital - Liquid nitrogen destruction 11/17/22 Imiquimod  cream started 12/30/22 used for ~6wks   Mobitz type 1 second degree AV block    OSA (obstructive sleep apnea)    a.) does not require nocturnal PAP therapy   Osteopenia    Pulmonary HTN (HCC) 06/2020   a.) mild   S/P CABG x 2 2001   a.) details unknown; performed in 2001 in [place].    Past Surgical History:  Procedure Laterality Date   CARDIAC CATHETERIZATION     COLONOSCOPY     CORONARY ARTERY BYPASS GRAFT     CYSTOSCOPY/URETEROSCOPY/HOLMIUM LASER/STENT PLACEMENT Right 11/07/2021   Procedure: CYSTOSCOPY/URETEROSCOPY/HOLMIUM LASER/STENT PLACEMENT;  Surgeon: Sondra Come, MD;  Location: [facility] ORS;  Service: Urology;  Laterality: Right;   FRACTURE SURGERY Left    fractured left arm   HOLEP-LASER ENUCLEATION OF THE PROSTATE WITH MORCELLATION N/A 11/07/2021   Procedure: HOLEP-LASER ENUCLEATION OF THE PROSTATE WITH MORCELLATION;  Surgeon: Sondra Come, MD;  Location: [facility] ORS;  Service: Urology;  Laterality: N/A;   LIPOMA EXCISION N/A 11/21/2018   Procedure: EXCISION BACK LIPOMA;  Surgeon: Griselda Miner, MD;  Location: [facility] OR;  Service: General;  Laterality: N/A;   PILONIDAL CYST EXCISION     SKIN CANCER EXCISION     TOTAL SHOULDER REPLACEMENT Left  UMBILICAL HERNIA REPAIR N/A 11/21/2018   Procedure: HERNIA REPAIR UMBILICAL ADULT WITH MESH;  Surgeon: Griselda Miner, MD;  Location: [facility] OR;  Service: General;  Laterality: N/A;    Social History   Socioeconomic History   Marital status: Widowed    Spouse name: Not on file   Number of children: Not on file   Years of education: Not on file   Highest education level: Not on file  Occupational History   Not on file  Tobacco Use   Smoking status: Former    Current packs/day: 0.00    Average packs/day: 1 pack/day for 20.0 years (20.0 ttl pk-yrs)    Types: Cigarettes    Start date: 09/21/1993    Quit date: 09/21/2013    Years since quitting: 10.2    Passive exposure: Past   Smokeless tobacco: Never   Tobacco comments:     quit on and off over the years  Vaping Use   Vaping status: Never Used  Substance and Sexual Activity   Alcohol use: Yes    Comment: socially   Drug use: No   Sexual activity: Yes    Birth control/protection: None  Other Topics Concern   Not on file  Social History Narrative   Not on file   Social Drivers of Health   Financial Resource Strain: Not on file  Food Insecurity: No Food Insecurity (08/16/2023)   Hunger Vital Sign    Worried About Running Out of Food in the Last Year: Never true    Ran Out of Food in the Last Year: Never true  Transportation Needs: No Transportation Needs (08/16/2023)   PRAPARE - Administrator, Civil Service (Medical): No    Lack of Transportation (Non-Medical): No  Physical Activity: Sufficiently Active (08/16/2023)   Exercise Vital Sign    Days of Exercise per Week: 7 days    Minutes of Exercise per Session: 60 min  Stress: Not on file  Social Connections: Not on file  Intimate Partner Violence: Not on file    Family History  Problem Relation Age of Onset   Prostate cancer Neg Hx    Bladder Cancer Neg Hx    Kidney cancer Neg Hx     No Known Allergies  Outpatient Medications Prior to Visit  Medication Sig   citalopram (CELEXA) 20 MG tablet Take 1 tablet (20 mg total) by mouth daily. (Patient taking differently: Take 40 mg by mouth daily.)   enalapril (VASOTEC) 20 MG tablet TAKE 1 TABLET BY MOUTH EVERY DAY   pantoprazole (PROTONIX) 40 MG tablet Take 1 tablet (40 mg total) by mouth daily.   rosuvastatin (CRESTOR) 10 MG tablet TAKE 1 TABLET BY MOUTH EVERY DAY   aspirin EC 81 MG tablet Take 1 tablet (81 mg total) by mouth daily. Swallow whole. (Patient not taking: Reported on 12/28/2023)   Coenzyme Q10 (CO Q-10 MAXIMUM STRENGTH PO) Take 1 capsule by mouth daily. MAX Q 10 (Patient not taking: Reported on 12/28/2023)   Ferrous Sulfate Dried (SLOW RELEASE IRON) 45 MG TBCR Take 45 mg by mouth daily. (Patient not taking: Reported on  08/16/2023)   fluticasone (FLONASE) 50 MCG/ACT nasal spray Place 2 sprays into both nostrils daily as needed for allergies. (Patient not taking: Reported on 12/28/2023)   GEMTESA 75 MG TABS Take 1 tablet by mouth daily. (Patient not taking: Reported on 12/28/2023)   imiquimod (ALDARA) 5 % cream Apply topically as directed. Apply to area on right cheek 5 nights  a week for 6 weeks (Patient not taking: Reported on 08/16/2023)   ondansetron (ZOFRAN) 4 MG tablet Take 1 tablet (4 mg total) by mouth every 8 (eight) hours as needed for nausea or vomiting. (Patient not taking: Reported on 12/28/2023)   vitamin B-12 (CYANOCOBALAMIN) 500 MCG tablet  (Patient not taking: Reported on 08/16/2023)   No facility-administered medications prior to visit.    Review of Systems  Constitutional:  Positive for malaise/fatigue. Negative for chills, diaphoresis, fever and weight loss.  HENT: Negative.  Negative for sore throat.   Eyes: Negative.   Respiratory: Negative.  Negative for cough and shortness of breath.   Cardiovascular: Negative.  Negative for chest pain, palpitations and leg swelling.  Gastrointestinal: Negative.  Negative for abdominal pain, constipation, diarrhea, heartburn, nausea and vomiting.  Genitourinary: Negative.  Negative for dysuria and flank pain.  Musculoskeletal: Negative.  Negative for joint pain and myalgias.  Skin: Negative.   Neurological: Negative.  Negative for dizziness and headaches.  Endo/Heme/Allergies: Negative.   Psychiatric/Behavioral: Negative.  Negative for depression and suicidal ideas. The patient is not nervous/anxious.        Objective:   BP 138/84   Pulse 64   Ht 5\' 9"  (1.753 m)   Wt 184 lb (83.5 kg)   SpO2 98%   BMI 27.17 kg/m   Vitals:   12/28/23 0941  BP: 138/84  Pulse: 64  Height: 5\' 9"  (1.753 m)  Weight: 184 lb (83.5 kg)  SpO2: 98%  BMI (Calculated): 27.16    Physical Exam Vitals and nursing note reviewed.  Constitutional:      Appearance:  Normal appearance.  HENT:     Head: Normocephalic and atraumatic.     Nose: Nose normal.     Mouth/Throat:     Mouth: Mucous membranes are moist.     Pharynx: Oropharynx is clear.  Eyes:     Conjunctiva/sclera: Conjunctivae normal.     Pupils: Pupils are equal, round, and reactive to light.  Cardiovascular:     Rate and Rhythm: Normal rate and regular rhythm.     Pulses: Normal pulses.     Heart sounds: Normal heart sounds.  Pulmonary:     Effort: Pulmonary effort is normal.     Breath sounds: Normal breath sounds.  Abdominal:     General: Bowel sounds are normal.     Palpations: Abdomen is soft.  Musculoskeletal:        General: Normal range of motion.     Cervical back: Normal range of motion.  Skin:    General: Skin is warm and dry.  Neurological:     General: No focal deficit present.     Mental Status: He is alert and oriented to person, place, and time.  Psychiatric:        Mood and Affect: Mood normal.        Behavior: Behavior normal.        Judgment: Judgment normal.      No results found for any visits on 12/28/23.  No results found for this or any previous visit (from the past 2160 hours).    Assessment & Plan:  Continue current medications.  Check labs. Set up home health with PT and OT. Add Aricept. Problem List Items Addressed This Visit     Coronary atherosclerosis of autologous vein bypass graft   Relevant Orders   CBC with Diff   Essential hypertension, benign - Primary   Relevant Orders   CMP14+EGFR   Hyperlipidemia  Relevant Orders   Lipid Panel w/o Chol/HDL Ratio   Unsteady gait when walking   Relevant Orders   Ambulatory referral to Home Health   Gastroesophageal reflux disease without esophagitis   Other Visit Diagnoses       Memory impairment of gradual onset       Relevant Medications   donepezil (ARICEPT) 10 MG tablet       Return in about 2 weeks (around 01/11/2024).   Total time spent: 30 minutes  Margaretann Loveless,  MD  12/28/2023   This document may have been prepared by [facility] Voice Recognition software and as such may include unintentional dictation errors.

## 2023-12-29 LAB — CBC WITH DIFFERENTIAL/PLATELET
Basophils Absolute: 0.1 10*3/uL (ref 0.0–0.2)
Basos: 1 %
EOS (ABSOLUTE): 0.1 10*3/uL (ref 0.0–0.4)
Eos: 2 %
Hematocrit: 40.4 % (ref 37.5–51.0)
Hemoglobin: 13.4 g/dL (ref 13.0–17.7)
Immature Grans (Abs): 0 10*3/uL (ref 0.0–0.1)
Immature Granulocytes: 0 %
Lymphocytes Absolute: 1.2 10*3/uL (ref 0.7–3.1)
Lymphs: 19 %
MCH: 32.8 pg (ref 26.6–33.0)
MCHC: 33.2 g/dL (ref 31.5–35.7)
MCV: 99 fL — ABNORMAL HIGH (ref 79–97)
Monocytes Absolute: 0.6 10*3/uL (ref 0.1–0.9)
Monocytes: 10 %
Neutrophils Absolute: 4.1 10*3/uL (ref 1.4–7.0)
Neutrophils: 68 %
Platelets: 158 10*3/uL (ref 150–450)
RBC: 4.08 x10E6/uL — ABNORMAL LOW (ref 4.14–5.80)
RDW: 12.6 % (ref 11.6–15.4)
WBC: 6.1 10*3/uL (ref 3.4–10.8)

## 2023-12-29 LAB — CMP14+EGFR
ALT: 14 IU/L (ref 0–44)
AST: 23 IU/L (ref 0–40)
Albumin: 4 g/dL (ref 3.7–4.7)
Alkaline Phosphatase: 91 IU/L (ref 44–121)
BUN/Creatinine Ratio: 16 (ref 10–24)
BUN: 17 mg/dL (ref 8–27)
Bilirubin Total: 0.7 mg/dL (ref 0.0–1.2)
CO2: 23 mmol/L (ref 20–29)
Calcium: 9.6 mg/dL (ref 8.6–10.2)
Chloride: 108 mmol/L — ABNORMAL HIGH (ref 96–106)
Creatinine, Ser: 1.08 mg/dL (ref 0.76–1.27)
Globulin, Total: 2.2 g/dL (ref 1.5–4.5)
Glucose: 112 mg/dL — ABNORMAL HIGH (ref 70–99)
Potassium: 4.4 mmol/L (ref 3.5–5.2)
Sodium: 142 mmol/L (ref 134–144)
Total Protein: 6.2 g/dL (ref 6.0–8.5)
eGFR: 67 mL/min/{1.73_m2} (ref >59)

## 2023-12-29 LAB — LIPID PANEL W/O CHOL/HDL RATIO
Cholesterol, Total: 117 mg/dL (ref 100–199)
HDL: 57 mg/dL (ref >39)
LDL Chol Calc (NIH): 44 mg/dL (ref 0–99)
Triglycerides: 81 mg/dL (ref 0–149)
VLDL Cholesterol Cal: 16 mg/dL (ref 5–40)

## 2023-12-30 DIAGNOSIS — I083 Combined rheumatic disorders of mitral, aortic and tricuspid valves: Secondary | ICD-10-CM | POA: Diagnosis not present

## 2023-12-30 DIAGNOSIS — I1310 Hypertensive heart and chronic kidney disease without heart failure, with stage 1 through stage 4 chronic kidney disease, or unspecified chronic kidney disease: Secondary | ICD-10-CM | POA: Diagnosis not present

## 2023-12-30 DIAGNOSIS — I700 Atherosclerosis of aorta: Secondary | ICD-10-CM | POA: Diagnosis not present

## 2023-12-30 DIAGNOSIS — Z85820 Personal history of malignant melanoma of skin: Secondary | ICD-10-CM | POA: Diagnosis not present

## 2023-12-30 DIAGNOSIS — N400 Enlarged prostate without lower urinary tract symptoms: Secondary | ICD-10-CM | POA: Diagnosis not present

## 2023-12-30 DIAGNOSIS — G4733 Obstructive sleep apnea (adult) (pediatric): Secondary | ICD-10-CM | POA: Diagnosis not present

## 2023-12-30 DIAGNOSIS — Z87442 Personal history of urinary calculi: Secondary | ICD-10-CM | POA: Diagnosis not present

## 2023-12-30 DIAGNOSIS — E782 Mixed hyperlipidemia: Secondary | ICD-10-CM | POA: Diagnosis not present

## 2023-12-30 DIAGNOSIS — K449 Diaphragmatic hernia without obstruction or gangrene: Secondary | ICD-10-CM | POA: Diagnosis not present

## 2023-12-30 DIAGNOSIS — I25810 Atherosclerosis of coronary artery bypass graft(s) without angina pectoris: Secondary | ICD-10-CM | POA: Diagnosis not present

## 2023-12-30 DIAGNOSIS — Q6102 Congenital multiple renal cysts: Secondary | ICD-10-CM | POA: Diagnosis not present

## 2023-12-30 DIAGNOSIS — I272 Pulmonary hypertension, unspecified: Secondary | ICD-10-CM | POA: Diagnosis not present

## 2023-12-30 DIAGNOSIS — Z7982 Long term (current) use of aspirin: Secondary | ICD-10-CM | POA: Diagnosis not present

## 2023-12-30 DIAGNOSIS — N189 Chronic kidney disease, unspecified: Secondary | ICD-10-CM | POA: Diagnosis not present

## 2023-12-30 DIAGNOSIS — I441 Atrioventricular block, second degree: Secondary | ICD-10-CM | POA: Diagnosis not present

## 2023-12-30 DIAGNOSIS — Z9181 History of falling: Secondary | ICD-10-CM | POA: Diagnosis not present

## 2023-12-30 DIAGNOSIS — K219 Gastro-esophageal reflux disease without esophagitis: Secondary | ICD-10-CM | POA: Diagnosis not present

## 2023-12-30 DIAGNOSIS — Z96612 Presence of left artificial shoulder joint: Secondary | ICD-10-CM | POA: Diagnosis not present

## 2023-12-30 DIAGNOSIS — Z951 Presence of aortocoronary bypass graft: Secondary | ICD-10-CM | POA: Diagnosis not present

## 2023-12-30 DIAGNOSIS — E538 Deficiency of other specified B group vitamins: Secondary | ICD-10-CM | POA: Diagnosis not present

## 2023-12-30 DIAGNOSIS — J449 Chronic obstructive pulmonary disease, unspecified: Secondary | ICD-10-CM | POA: Diagnosis not present

## 2023-12-30 DIAGNOSIS — K648 Other hemorrhoids: Secondary | ICD-10-CM | POA: Diagnosis not present

## 2023-12-30 DIAGNOSIS — F419 Anxiety disorder, unspecified: Secondary | ICD-10-CM | POA: Diagnosis not present

## 2023-12-30 DIAGNOSIS — Z87891 Personal history of nicotine dependence: Secondary | ICD-10-CM | POA: Diagnosis not present

## 2023-12-31 NOTE — Progress Notes (Signed)
 Patient notified

## 2024-01-03 ENCOUNTER — Telehealth: Payer: Self-pay | Admitting: Internal Medicine

## 2024-01-03 NOTE — Telephone Encounter (Signed)
 Amy, PT with [facility], left VM requesting orders for PT 1w2, 2w2, 1w3.   Callback # [phone]

## 2024-01-11 ENCOUNTER — Ambulatory Visit: Admitting: Internal Medicine

## 2024-02-10 ENCOUNTER — Ambulatory Visit (INDEPENDENT_AMBULATORY_CARE_PROVIDER_SITE_OTHER): Admitting: Internal Medicine

## 2024-02-10 ENCOUNTER — Ambulatory Visit
Admission: RE | Admit: 2024-02-10 | Discharge: 2024-02-10 | Disposition: A | Discharge status: Home / Self Care | Source: Ambulatory Visit | Attending: Internal Medicine | Admitting: Internal Medicine

## 2024-02-10 ENCOUNTER — Encounter: Payer: Self-pay | Admitting: Internal Medicine

## 2024-02-10 ENCOUNTER — Ambulatory Visit
Admission: RE | Admit: 2024-02-10 | Discharge: 2024-02-10 | Disposition: A | Discharge status: Home / Self Care | Attending: Internal Medicine | Admitting: Internal Medicine

## 2024-02-10 ENCOUNTER — Ambulatory Visit: Payer: Self-pay | Admitting: Internal Medicine

## 2024-02-10 VITALS — BP 132/60 | HR 68 | Ht 69.0 in | Wt 180.0 lb

## 2024-02-10 DIAGNOSIS — I10 Essential (primary) hypertension: Secondary | ICD-10-CM | POA: Diagnosis not present

## 2024-02-10 DIAGNOSIS — J449 Chronic obstructive pulmonary disease, unspecified: Secondary | ICD-10-CM | POA: Diagnosis not present

## 2024-02-10 DIAGNOSIS — R059 Cough, unspecified: Secondary | ICD-10-CM | POA: Diagnosis not present

## 2024-02-10 DIAGNOSIS — I771 Stricture of artery: Secondary | ICD-10-CM | POA: Diagnosis not present

## 2024-02-10 DIAGNOSIS — J069 Acute upper respiratory infection, unspecified: Secondary | ICD-10-CM | POA: Diagnosis not present

## 2024-02-10 DIAGNOSIS — R269 Unspecified abnormalities of gait and mobility: Secondary | ICD-10-CM

## 2024-02-10 DIAGNOSIS — R2681 Unsteadiness on feet: Secondary | ICD-10-CM

## 2024-02-10 DIAGNOSIS — I25810 Atherosclerosis of coronary artery bypass graft(s) without angina pectoris: Secondary | ICD-10-CM

## 2024-02-10 DIAGNOSIS — R051 Acute cough: Secondary | ICD-10-CM

## 2024-02-10 DIAGNOSIS — M791 Myalgia, unspecified site: Secondary | ICD-10-CM | POA: Diagnosis not present

## 2024-02-10 DIAGNOSIS — I700 Atherosclerosis of aorta: Secondary | ICD-10-CM | POA: Diagnosis not present

## 2024-02-10 LAB — POCT XPERT XPRESS SARS COVID-2/FLU/RSV
FLU A: NEGATIVE
FLU B: NEGATIVE
RSV RNA, PCR: NEGATIVE
SARS Coronavirus 2: NEGATIVE

## 2024-02-10 NOTE — Progress Notes (Signed)
 Established Patient Office Visit  Subjective:  Patient ID: James [name], male    DOB: 10-02-1935  Age: 88 y.o. MRN: [id number]  Chief Complaint  Patient presents with   Follow-up    Bilateral leg pains    Patient comes in for his follow-up today.  He brought in all his medications, some of them he is not taking like Gemtesa because it did not work.  He also stopped taking Aricept  because it caused nightmares and mental fogginess.  He started to feel better once he stopped it.  Today he is coughing and has some chest congestion, he thinks it is because of his recent trip on airplanes he could have caught something.  Today his COVID/flu/RSV test in the office is negative.  Will get a chest x-ray today.  Patient is also concerned about his gait.  He starts taking small shuffling steps after walking a short distance.  He starts to feel weakness in his legs and has to sit down.  He carries a cane does but not want walker.  But he is getting worried that he is having difficulty walking after walking normally for a few steps.  He had home PT and gait training, but thinks it did not not help him at all. Will send another referral to the neurologist to be evaluated for movement disorders/neuromuscular disorders.    No other concerns at this time.   Past Medical History:  Diagnosis Date   Anxiety    Aortic atherosclerosis (HCC)    Aortic dilatation (HCC) 01/10/2021   a.) CT 01/10/2021: infrarenal aortic dilitation measuring 3.3 x 3.3 cm. b.) CT 06/23/2021: measured 3.4 cm.   Arthritis    B12 deficiency    Basal cell carcinoma 03/14/2019   R nasal tip    Bilateral renal cysts    BPH (benign prostatic hyperplasia)    Bradycardia    Chronic kidney disease    COPD (chronic obstructive pulmonary disease) (HCC)    Coronary artery disease    a.) s/p 2v CABG in 2001; performed while living in [place] .   Diastolic dysfunction 06/2020   a.) TTE 06/2020: EF 60%; sev LA dilation,  mild RA dilation; triv PR, mild to mod TR/MR/AR; G1DD.   Frequency of urination    GERD (gastroesophageal reflux disease)    Hiatal hernia    History of kidney stones    HLD (hyperlipidemia)    Hypertension    Internal hemorrhoids    Long term current use of anticoagulant    a.) Rivaroxaban   Melanoma (HCC) 11/11/2022   right infra orbital - Liquid nitrogen destruction 11/17/22 Imiquimod  cream started 12/30/22 used for ~6wks   Mobitz type 1 second degree AV block    OSA (obstructive sleep apnea)    a.) does not require nocturnal PAP therapy   Osteopenia    Pulmonary HTN (HCC) 06/2020   a.) mild   S/P CABG x 2 2001   a.) details unknown; performed in 2001 in [place] .    Past Surgical History:  Procedure Laterality Date   CARDIAC CATHETERIZATION     COLONOSCOPY     CORONARY ARTERY BYPASS GRAFT     CYSTOSCOPY/URETEROSCOPY/HOLMIUM LASER/STENT PLACEMENT Right 11/07/2021   Procedure: CYSTOSCOPY/URETEROSCOPY/HOLMIUM LASER/STENT PLACEMENT;  Surgeon: Lawerence Pressman, MD;  Location: [facility] ORS;  Service: Urology;  Laterality: Right;   FRACTURE SURGERY Left    fractured left arm   HOLEP-LASER ENUCLEATION OF THE PROSTATE WITH MORCELLATION N/A 11/07/2021   Procedure: HOLEP-LASER ENUCLEATION  OF THE PROSTATE WITH MORCELLATION;  Surgeon: Lawerence Pressman, MD;  Location: [facility] ORS;  Service: Urology;  Laterality: N/A;   LIPOMA EXCISION N/A 11/21/2018   Procedure: EXCISION BACK LIPOMA;  Surgeon: Caralyn Chandler, MD;  Location: [facility] OR;  Service: General;  Laterality: N/A;   PILONIDAL CYST EXCISION     SKIN CANCER EXCISION     TOTAL SHOULDER REPLACEMENT Left    UMBILICAL HERNIA REPAIR N/A 11/21/2018   Procedure: HERNIA REPAIR UMBILICAL ADULT WITH MESH;  Surgeon: Caralyn Chandler, MD;  Location: [facility] OR;  Service: General;  Laterality: N/A;    Social History   Socioeconomic History   Marital status: Widowed    Spouse name: Not on file   Number of children: Not on file   Years of education: Not on  file   Highest education level: Not on file  Occupational History   Not on file  Tobacco Use   Smoking status: Former    Current packs/day: 0.00    Average packs/day: 1 pack/day for 20.0 years (20.0 ttl pk-yrs)    Types: Cigarettes    Start date: 09/21/1993    Quit date: 09/21/2013    Years since quitting: 10.3    Passive exposure: Past   Smokeless tobacco: Never   Tobacco comments:    quit on and off over the years  Vaping Use   Vaping status: Never Used  Substance and Sexual Activity   Alcohol use: Yes    Comment: socially   Drug use: No   Sexual activity: Yes    Birth control/protection: None  Other Topics Concern   Not on file  Social History Narrative   Not on file   Social Drivers of Health   Financial Resource Strain: Not on file  Food Insecurity: No Food Insecurity (08/16/2023)   Hunger Vital Sign    Worried About Running Out of Food in the Last Year: Never true    Ran Out of Food in the Last Year: Never true  Transportation Needs: No Transportation Needs (08/16/2023)   PRAPARE - Administrator, Civil Service (Medical): No    Lack of Transportation (Non-Medical): No  Physical Activity: Sufficiently Active (08/16/2023)   Exercise Vital Sign    Days of Exercise per Week: 7 days    Minutes of Exercise per Session: 60 min  Stress: Not on file  Social Connections: Not on file  Intimate Partner Violence: Not on file    Family History  Problem Relation Age of Onset   Prostate cancer Neg Hx    Bladder Cancer Neg Hx    Kidney cancer Neg Hx     No Known Allergies  Outpatient Medications Prior to Visit  Medication Sig   citalopram  (CELEXA ) 40 MG tablet Take 40 mg by mouth daily.   aspirin  EC 81 MG tablet Take 1 tablet (81 mg total) by mouth daily. Swallow whole. (Patient not taking: Reported on 12/28/2023)   Coenzyme Q10 (CO Q-10 MAXIMUM STRENGTH PO) Take 1 capsule by mouth daily. MAX Q 10 (Patient not taking: Reported on 12/28/2023)   enalapril  (VASOTEC) 20 MG tablet TAKE 1 TABLET BY MOUTH EVERY DAY   Ferrous Sulfate Dried (SLOW RELEASE IRON) 45 MG TBCR Take 45 mg by mouth daily. (Patient not taking: Reported on 08/16/2023)   fluticasone (FLONASE) 50 MCG/ACT nasal spray Place 2 sprays into both nostrils daily as needed for allergies. (Patient not taking: Reported on 12/28/2023)   imiquimod  (ALDARA ) 5 % cream  Apply topically as directed. Apply to area on right cheek 5 nights a week for 6 weeks (Patient not taking: Reported on 08/16/2023)   ondansetron  (ZOFRAN ) 4 MG tablet Take 1 tablet (4 mg total) by mouth every 8 (eight) hours as needed for nausea or vomiting. (Patient not taking: Reported on 12/28/2023)   pantoprazole  (PROTONIX ) 40 MG tablet Take 1 tablet (40 mg total) by mouth daily.   rosuvastatin (CRESTOR) 10 MG tablet TAKE 1 TABLET BY MOUTH EVERY DAY   vitamin B-12 (CYANOCOBALAMIN) 500 MCG tablet  (Patient not taking: Reported on 08/16/2023)   [DISCONTINUED] citalopram  (CELEXA ) 20 MG tablet Take 1 tablet (20 mg total) by mouth daily. (Patient taking differently: Take 40 mg by mouth daily.)   [DISCONTINUED] donepezil  (ARICEPT ) 10 MG tablet Take 1 tablet (10 mg total) by mouth at bedtime.   [DISCONTINUED] GEMTESA 75 MG TABS Take 1 tablet by mouth daily. (Patient not taking: Reported on 12/28/2023)   No facility-administered medications prior to visit.    Review of Systems  Constitutional: Negative.  Negative for chills, fever, malaise/fatigue and weight loss.  HENT: Negative.  Negative for sore throat.   Eyes: Negative.   Respiratory: Negative.  Negative for cough and shortness of breath.   Cardiovascular: Negative.  Negative for chest pain, palpitations and leg swelling.  Gastrointestinal: Negative.  Negative for abdominal pain, constipation, diarrhea, heartburn, nausea and vomiting.  Genitourinary: Negative.  Negative for dysuria and flank pain.  Musculoskeletal: Negative.  Negative for joint pain and myalgias.  Skin: Negative.    Neurological:  Positive for weakness. Negative for dizziness, tingling, tremors, focal weakness, seizures and headaches.  Endo/Heme/Allergies: Negative.   Psychiatric/Behavioral: Negative.  Negative for depression and suicidal ideas. The patient is not nervous/anxious.        Objective:   BP 132/60   Pulse 68   Ht 5\' 9"  (1.753 m)   Wt 180 lb (81.6 kg)   SpO2 97%   BMI 26.58 kg/m   Vitals:   02/10/24 1317  BP: 132/60  Pulse: 68  Height: 5\' 9"  (1.753 m)  Weight: 180 lb (81.6 kg)  SpO2: 97%  BMI (Calculated): 26.57    Physical Exam Vitals and nursing note reviewed.  Constitutional:      Appearance: Normal appearance.  HENT:     Head: Normocephalic and atraumatic.     Nose: Nose normal.     Mouth/Throat:     Mouth: Mucous membranes are moist.     Pharynx: Oropharynx is clear.  Eyes:     Conjunctiva/sclera: Conjunctivae normal.     Pupils: Pupils are equal, round, and reactive to light.  Cardiovascular:     Rate and Rhythm: Normal rate and regular rhythm.     Pulses: Normal pulses.     Heart sounds: Normal heart sounds.  Pulmonary:     Effort: Pulmonary effort is normal.     Breath sounds: Normal breath sounds.  Abdominal:     General: Bowel sounds are normal.     Palpations: Abdomen is soft.  Musculoskeletal:        General: Normal range of motion.     Cervical back: Normal range of motion.  Skin:    General: Skin is warm and dry.  Neurological:     General: No focal deficit present.     Mental Status: He is alert and oriented to person, place, and time.  Psychiatric:        Mood and Affect: Mood normal.  Behavior: Behavior normal.        Judgment: Judgment normal.      Results for orders placed or performed in visit on 02/10/24  POCT XPERT XPRESS SARS COVID-2/FLU/RSV [[id number]]  Result Value Ref Range   SARS Coronavirus 2 Negative    FLU A Negative    FLU B Negative    RSV RNA, PCR Negative     Recent Results (from the past 2160 hours)   CMP14+EGFR     Status: Abnormal   Collection Time: 12/28/23 10:23 AM  Result Value Ref Range   Glucose 112 (H) 70 - 99 mg/dL   BUN 17 8 - 27 mg/dL   Creatinine, Ser 4.09 0.76 - 1.27 mg/dL   eGFR 67 >81 XB/JYN/8.29   BUN/Creatinine Ratio 16 10 - 24   Sodium 142 134 - 144 mmol/L   Potassium 4.4 3.5 - 5.2 mmol/L   Chloride 108 (H) 96 - 106 mmol/L   CO2 23 20 - 29 mmol/L   Calcium 9.6 8.6 - 10.2 mg/dL   Total Protein 6.2 6.0 - 8.5 g/dL   Albumin 4.0 3.7 - 4.7 g/dL   Globulin, Total 2.2 1.5 - 4.5 g/dL   Bilirubin Total 0.7 0.0 - 1.2 mg/dL   Alkaline Phosphatase 91 44 - 121 IU/L   AST 23 0 - 40 IU/L   ALT 14 0 - 44 IU/L  Lipid Panel w/o Chol/HDL Ratio     Status: None   Collection Time: 12/28/23 10:23 AM  Result Value Ref Range   Cholesterol, Total 117 100 - 199 mg/dL   Triglycerides 81 0 - 149 mg/dL   HDL 57 >56 mg/dL   VLDL Cholesterol Cal 16 5 - 40 mg/dL   LDL Chol Calc (NIH) 44 0 - 99 mg/dL  CBC with Diff     Status: Abnormal   Collection Time: 12/28/23 10:23 AM  Result Value Ref Range   WBC 6.1 3.4 - 10.8 x10E3/uL   RBC 4.08 (L) 4.14 - 5.80 x10E6/uL   Hemoglobin 13.4 13.0 - 17.7 g/dL   Hematocrit 21.3 08.6 - 51.0 %   MCV 99 (H) 79 - 97 fL   MCH 32.8 26.6 - 33.0 pg   MCHC 33.2 31.5 - 35.7 g/dL   RDW 57.8 46.9 - 62.9 %   Platelets 158 150 - 450 x10E3/uL   Neutrophils 68 Not Estab. %   Lymphs 19 Not Estab. %   Monocytes 10 Not Estab. %   Eos 2 Not Estab. %   Basos 1 Not Estab. %   Neutrophils Absolute 4.1 1.4 - 7.0 x10E3/uL   Lymphocytes Absolute 1.2 0.7 - 3.1 x10E3/uL   Monocytes Absolute 0.6 0.1 - 0.9 x10E3/uL   EOS (ABSOLUTE) 0.1 0.0 - 0.4 x10E3/uL   Basophils Absolute 0.1 0.0 - 0.2 x10E3/uL   Immature Granulocytes 0 Not Estab. %   Immature Grans (Abs) 0.0 0.0 - 0.1 x10E3/uL  POCT XPERT XPRESS SARS COVID-2/FLU/RSV [[id number]]     Status: Normal   Collection Time: 02/10/24  2:41 PM  Result Value Ref Range   SARS Coronavirus 2 Negative    FLU A Negative    FLU  B Negative    RSV RNA, PCR Negative       Assessment & Plan:  Chest x-ray today. Neurology referral. Labs. Problem List Items Addressed This Visit     Coronary atherosclerosis of autologous vein bypass graft   Essential hypertension, benign - Primary   Unsteady gait when walking  Other Visit Diagnoses       Acute cough       Relevant Orders   DG Chest 2 View   POCT XPERT XPRESS SARS COVID-2/FLU/RSV [[id number]] (Completed)     Myalgia       Relevant Orders   Sed Rate (ESR)   CK, total   POCT XPERT XPRESS SARS COVID-2/FLU/RSV [[id number]] (Completed)     Gait disturbance       Relevant Orders   Ambulatory referral to Neurology       Return in about 2 weeks (around 02/24/2024).   Total time spent: 30 minutes  Aisha Hove, MD  02/10/2024   This document may have been prepared by [facility] Voice Recognition software and as such may include unintentional dictation errors.

## 2024-02-11 LAB — SEDIMENTATION RATE: Sed Rate: 6 mm/h (ref 0–30)

## 2024-02-11 LAB — CK: Total CK: 54 U/L (ref 30–208)

## 2024-02-11 NOTE — Progress Notes (Signed)
 Patient notified

## 2024-02-17 ENCOUNTER — Ambulatory Visit: Payer: HMO | Admitting: Internal Medicine

## 2024-02-24 ENCOUNTER — Ambulatory Visit: Admitting: Internal Medicine

## 2024-02-24 ENCOUNTER — Encounter: Payer: Self-pay | Admitting: Internal Medicine

## 2024-02-24 VITALS — BP 168/90 | HR 84 | Ht 69.0 in | Wt 180.0 lb

## 2024-02-24 DIAGNOSIS — E782 Mixed hyperlipidemia: Secondary | ICD-10-CM | POA: Diagnosis not present

## 2024-02-24 DIAGNOSIS — R413 Other amnesia: Secondary | ICD-10-CM | POA: Diagnosis not present

## 2024-02-24 DIAGNOSIS — K219 Gastro-esophageal reflux disease without esophagitis: Secondary | ICD-10-CM | POA: Diagnosis not present

## 2024-02-24 DIAGNOSIS — R269 Unspecified abnormalities of gait and mobility: Secondary | ICD-10-CM | POA: Diagnosis not present

## 2024-02-24 DIAGNOSIS — I25810 Atherosclerosis of coronary artery bypass graft(s) without angina pectoris: Secondary | ICD-10-CM

## 2024-02-24 DIAGNOSIS — I10 Essential (primary) hypertension: Secondary | ICD-10-CM

## 2024-02-24 NOTE — Progress Notes (Signed)
 Established Patient Office Visit  Subjective:  Patient ID: James [name], male    DOB: Jun 10, 1936  Age: 88 y.o. MRN: [id number]  Chief Complaint  Patient presents with   Follow-up    2 week follow up    Patient comes in for follow-up today.  His cough has improved he just feels mild congestion.  His chest x-ray was negative.  His blood pressure is high today but admits to not taking his medications until late.  He has made an appointment with a neurologist for his gait disturbance.    No other concerns at this time.   Past Medical History:  Diagnosis Date   Anxiety    Aortic atherosclerosis (HCC)    Aortic dilatation (HCC) 01/10/2021   a.) CT 01/10/2021: infrarenal aortic dilitation measuring 3.3 x 3.3 cm. b.) CT 06/23/2021: measured 3.4 cm.   Arthritis    B12 deficiency    Basal cell carcinoma 03/14/2019   R nasal tip    Bilateral renal cysts    BPH (benign prostatic hyperplasia)    Bradycardia    Chronic kidney disease    COPD (chronic obstructive pulmonary disease) (HCC)    Coronary artery disease    a.) s/p 2v CABG in 2001; performed while living in [place] .   Diastolic dysfunction 06/2020   a.) TTE 06/2020: EF 60%; sev LA dilation, mild RA dilation; triv PR, mild to mod TR/MR/AR; G1DD.   Frequency of urination    GERD (gastroesophageal reflux disease)    Hiatal hernia    History of kidney stones    HLD (hyperlipidemia)    Hypertension    Internal hemorrhoids    Long term current use of anticoagulant    a.) Rivaroxaban   Melanoma (HCC) 11/11/2022   right infra orbital - Liquid nitrogen destruction 11/17/22 Imiquimod  cream started 12/30/22 used for ~6wks   Mobitz type 1 second degree AV block    OSA (obstructive sleep apnea)    a.) does not require nocturnal PAP therapy   Osteopenia    Pulmonary HTN (HCC) 06/2020   a.) mild   S/P CABG x 2 2001   a.) details unknown; performed in 2001 in [place] .    Past Surgical History:  Procedure  Laterality Date   CARDIAC CATHETERIZATION     COLONOSCOPY     CORONARY ARTERY BYPASS GRAFT     CYSTOSCOPY/URETEROSCOPY/HOLMIUM LASER/STENT PLACEMENT Right 11/07/2021   Procedure: CYSTOSCOPY/URETEROSCOPY/HOLMIUM LASER/STENT PLACEMENT;  Surgeon: Lawerence Pressman, MD;  Location: [facility] ORS;  Service: Urology;  Laterality: Right;   FRACTURE SURGERY Left    fractured left arm   HOLEP-LASER ENUCLEATION OF THE PROSTATE WITH MORCELLATION N/A 11/07/2021   Procedure: HOLEP-LASER ENUCLEATION OF THE PROSTATE WITH MORCELLATION;  Surgeon: Lawerence Pressman, MD;  Location: [facility] OR[facility];  Service: Urology;  Laterality: N/A;   LIPOMA EXCISION N/A 11/21/2018   Procedure: EXCISION BACK LIPOMA;  Surgeon: Caralyn Chandler, MD;  Location: [facility] OR;  Service: General;  Laterality: N/A;   PILONIDAL CYST EXCISION     SKIN CANCER EXCISION     TOTAL SHOULDER REPLACEMENT Left    UMBILICAL HERNIA REPAIR N/A 11/21/2018   Procedure: HERNIA REPAIR UMBILICAL ADULT WITH MESH;  Surgeon: Caralyn Chandler, MD;  Location: [facility] OR;  Service: General;  Laterality: N/A;    Social History   Socioeconomic History   Marital status: Widowed    Spouse name: Not on file   Number of children: Not on file   Years of education:  Not on file   Highest education level: Not on file  Occupational History   Not on file  Tobacco Use   Smoking status: Former    Current packs/day: 0.00    Average packs/day: 1 pack/day for 20.0 years (20.0 ttl pk-yrs)    Types: Cigarettes    Start date: 09/21/1993    Quit date: 09/21/2013    Years since quitting: 10.4    Passive exposure: Past   Smokeless tobacco: Never   Tobacco comments:    quit on and off over the years  Vaping Use   Vaping status: Never Used  Substance and Sexual Activity   Alcohol use: Yes    Comment: socially   Drug use: No   Sexual activity: Yes    Birth control/protection: None  Other Topics Concern   Not on file  Social History Narrative   Not on file   Social Drivers of Health    Financial Resource Strain: Not on file  Food Insecurity: No Food Insecurity (08/16/2023)   Hunger Vital Sign    Worried About Running Out of Food in the Last Year: Never true    Ran Out of Food in the Last Year: Never true  Transportation Needs: No Transportation Needs (08/16/2023)   PRAPARE - Administrator, Civil Service (Medical): No    Lack of Transportation (Non-Medical): No  Physical Activity: Sufficiently Active (08/16/2023)   Exercise Vital Sign    Days of Exercise per Week: 7 days    Minutes of Exercise per Session: 60 min  Stress: Not on file  Social Connections: Not on file  Intimate Partner Violence: Not on file    Family History  Problem Relation Age of Onset   Prostate cancer Neg Hx    Bladder Cancer Neg Hx    Kidney cancer Neg Hx     No Known Allergies  Outpatient Medications Prior to Visit  Medication Sig   citalopram  (CELEXA ) 40 MG tablet Take 40 mg by mouth daily.   enalapril (VASOTEC) 20 MG tablet TAKE 1 TABLET BY MOUTH EVERY DAY   pantoprazole  (PROTONIX ) 40 MG tablet Take 1 tablet (40 mg total) by mouth daily.   rosuvastatin (CRESTOR) 10 MG tablet TAKE 1 TABLET BY MOUTH EVERY DAY   aspirin  EC 81 MG tablet Take 1 tablet (81 mg total) by mouth daily. Swallow whole. (Patient not taking: Reported on 02/24/2024)   Coenzyme Q10 (CO Q-10 MAXIMUM STRENGTH PO) Take 1 capsule by mouth daily. MAX Q 10 (Patient not taking: Reported on 12/28/2023)   Ferrous Sulfate Dried (SLOW RELEASE IRON) 45 MG TBCR Take 45 mg by mouth daily. (Patient not taking: Reported on 08/16/2023)   fluticasone (FLONASE) 50 MCG/ACT nasal spray Place 2 sprays into both nostrils daily as needed for allergies. (Patient not taking: Reported on 12/28/2023)   imiquimod  (ALDARA ) 5 % cream Apply topically as directed. Apply to area on right cheek 5 nights a week for 6 weeks (Patient not taking: Reported on 02/24/2024)   ondansetron  (ZOFRAN ) 4 MG tablet Take 1 tablet (4 mg total) by mouth every 8  (eight) hours as needed for nausea or vomiting. (Patient not taking: Reported on 02/24/2024)   vitamin B-12 (CYANOCOBALAMIN) 500 MCG tablet  (Patient not taking: Reported on 02/24/2024)   No facility-administered medications prior to visit.    Review of Systems  Constitutional: Negative.  Negative for chills, diaphoresis, fever, malaise/fatigue and weight loss.  HENT: Negative.    Eyes: Negative.   Respiratory:  Negative.  Negative for cough and shortness of breath.   Cardiovascular: Negative.  Negative for chest pain, palpitations and leg swelling.  Gastrointestinal: Negative.  Negative for abdominal pain, constipation, diarrhea, heartburn, nausea and vomiting.  Genitourinary: Negative.  Negative for dysuria and flank pain.  Musculoskeletal: Negative.  Negative for joint pain and myalgias.  Skin: Negative.   Neurological: Negative.  Negative for dizziness, tingling, tremors and headaches.  Endo/Heme/Allergies: Negative.   Psychiatric/Behavioral: Negative.  Negative for depression and suicidal ideas. The patient is not nervous/anxious.        Objective:   BP (!) 168/90   Pulse 84   Ht 5\' 9"  (1.753 m)   Wt 180 lb (81.6 kg)   SpO2 97%   BMI 26.58 kg/m   Vitals:   02/24/24 1356  BP: (!) 168/90  Pulse: 84  Height: 5\' 9"  (1.753 m)  Weight: 180 lb (81.6 kg)  SpO2: 97%  BMI (Calculated): 26.57    Physical Exam Vitals and nursing note reviewed.  Constitutional:      Appearance: Normal appearance.  HENT:     Head: Normocephalic and atraumatic.     Nose: Nose normal.     Mouth/Throat:     Mouth: Mucous membranes are moist.     Pharynx: Oropharynx is clear.  Eyes:     Conjunctiva/sclera: Conjunctivae normal.     Pupils: Pupils are equal, round, and reactive to light.  Cardiovascular:     Rate and Rhythm: Normal rate and regular rhythm.     Pulses: Normal pulses.     Heart sounds: Normal heart sounds.  Pulmonary:     Effort: Pulmonary effort is normal.     Breath sounds:  Normal breath sounds.  Abdominal:     General: Bowel sounds are normal.     Palpations: Abdomen is soft.  Musculoskeletal:        General: Normal range of motion.     Cervical back: Normal range of motion.  Skin:    General: Skin is warm and dry.  Neurological:     General: No focal deficit present.     Mental Status: He is alert and oriented to person, place, and time.  Psychiatric:        Mood and Affect: Mood normal.        Behavior: Behavior normal.        Judgment: Judgment normal.      No results found for any visits on 02/24/24.  Recent Results (from the past 2160 hours)  CMP14+EGFR     Status: Abnormal   Collection Time: 12/28/23 10:23 AM  Result Value Ref Range   Glucose 112 (H) 70 - 99 mg/dL   BUN 17 8 - 27 mg/dL   Creatinine, Ser 1.19 0.76 - 1.27 mg/dL   eGFR 67 >14 NW/GNF/6.21   BUN/Creatinine Ratio 16 10 - 24   Sodium 142 134 - 144 mmol/L   Potassium 4.4 3.5 - 5.2 mmol/L   Chloride 108 (H) 96 - 106 mmol/L   CO2 23 20 - 29 mmol/L   Calcium 9.6 8.6 - 10.2 mg/dL   Total Protein 6.2 6.0 - 8.5 g/dL   Albumin 4.0 3.7 - 4.7 g/dL   Globulin, Total 2.2 1.5 - 4.5 g/dL   Bilirubin Total 0.7 0.0 - 1.2 mg/dL   Alkaline Phosphatase 91 44 - 121 IU/L   AST 23 0 - 40 IU/L   ALT 14 0 - 44 IU/L  Lipid Panel w/o Chol/HDL Ratio  Status: None   Collection Time: 12/28/23 10:23 AM  Result Value Ref Range   Cholesterol, Total 117 100 - 199 mg/dL   Triglycerides 81 0 - 149 mg/dL   HDL 57 >53 mg/dL   VLDL Cholesterol Cal 16 5 - 40 mg/dL   LDL Chol Calc (NIH) 44 0 - 99 mg/dL  CBC with Diff     Status: Abnormal   Collection Time: 12/28/23 10:23 AM  Result Value Ref Range   WBC 6.1 3.4 - 10.8 x10E3/uL   RBC 4.08 (L) 4.14 - 5.80 x10E6/uL   Hemoglobin 13.4 13.0 - 17.7 g/dL   Hematocrit 66.4 40.3 - 51.0 %   MCV 99 (H) 79 - 97 fL   MCH 32.8 26.6 - 33.0 pg   MCHC 33.2 31.5 - 35.7 g/dL   RDW 47.4 25.9 - 56.3 %   Platelets 158 150 - 450 x10E3/uL   Neutrophils 68 Not Estab. %    Lymphs 19 Not Estab. %   Monocytes 10 Not Estab. %   Eos 2 Not Estab. %   Basos 1 Not Estab. %   Neutrophils Absolute 4.1 1.4 - 7.0 x10E3/uL   Lymphocytes Absolute 1.2 0.7 - 3.1 x10E3/uL   Monocytes Absolute 0.6 0.1 - 0.9 x10E3/uL   EOS (ABSOLUTE) 0.1 0.0 - 0.4 x10E3/uL   Basophils Absolute 0.1 0.0 - 0.2 x10E3/uL   Immature Granulocytes 0 Not Estab. %   Immature Grans (Abs) 0.0 0.0 - 0.1 x10E3/uL  Sed Rate (ESR)     Status: None   Collection Time: 02/10/24  2:01 PM  Result Value Ref Range   Sed Rate 6 0 - 30 mm/hr  CK, total     Status: None   Collection Time: 02/10/24  2:01 PM  Result Value Ref Range   Total CK 54 30 - 208 U/L  POCT XPERT XPRESS SARS COVID-2/FLU/RSV [[id number]]     Status: Normal   Collection Time: 02/10/24  2:41 PM  Result Value Ref Range   SARS Coronavirus 2 Negative    FLU A Negative    FLU B Negative    RSV RNA, PCR Negative       Assessment & Plan:  Continue current medications.  Monitor blood pressure.  Follow-up with the neurologist. Problem List Items Addressed This Visit     Coronary atherosclerosis of autologous vein bypass graft   Essential hypertension, benign - Primary   Hyperlipidemia   Gastroesophageal reflux disease without esophagitis   Other Visit Diagnoses       Gait disturbance         Memory impairment of gradual onset           Return in about 4 months (around 06/25/2024).   Total time spent: 25 minutes  Aisha Hove, MD  02/24/2024   This document may have been prepared by [facility] Voice Recognition software and as such may include unintentional dictation errors.

## 2024-03-26 ENCOUNTER — Other Ambulatory Visit: Payer: Self-pay | Admitting: Internal Medicine

## 2024-03-26 DIAGNOSIS — R413 Other amnesia: Secondary | ICD-10-CM

## 2024-04-08 ENCOUNTER — Other Ambulatory Visit: Payer: Self-pay | Admitting: Cardiovascular Disease

## 2024-04-08 DIAGNOSIS — I10 Essential (primary) hypertension: Secondary | ICD-10-CM

## 2024-04-19 ENCOUNTER — Other Ambulatory Visit: Payer: Self-pay | Admitting: Internal Medicine

## 2024-04-19 DIAGNOSIS — F411 Generalized anxiety disorder: Secondary | ICD-10-CM

## 2024-04-19 DIAGNOSIS — R413 Other amnesia: Secondary | ICD-10-CM

## 2024-05-05 ENCOUNTER — Ambulatory Visit (INDEPENDENT_AMBULATORY_CARE_PROVIDER_SITE_OTHER): Admitting: Cardiology

## 2024-05-05 ENCOUNTER — Encounter: Payer: Self-pay | Admitting: Cardiology

## 2024-05-05 ENCOUNTER — Ambulatory Visit
Admission: RE | Admit: 2024-05-05 | Discharge: 2024-05-05 | Disposition: A | Discharge status: Home / Self Care | Source: Ambulatory Visit | Attending: Cardiology | Admitting: Cardiology

## 2024-05-05 ENCOUNTER — Ambulatory Visit
Admission: RE | Admit: 2024-05-05 | Discharge: 2024-05-05 | Disposition: A | Discharge status: Home / Self Care | Attending: Cardiology | Admitting: Cardiology

## 2024-05-05 VITALS — BP 140/80 | HR 72 | Ht 69.0 in | Wt 177.0 lb

## 2024-05-05 DIAGNOSIS — M25562 Pain in left knee: Secondary | ICD-10-CM | POA: Insufficient documentation

## 2024-05-05 DIAGNOSIS — I10 Essential (primary) hypertension: Secondary | ICD-10-CM

## 2024-05-05 NOTE — Progress Notes (Signed)
 Established Patient Office Visit  Subjective:  Patient ID: James [name], male    DOB: [birth year]  Age: 88 y.o. MRN: [id number]  Chief Complaint  Patient presents with   Acute Visit    Knee pain    Patient in office for an acute visit, complaining of left knee pain. No edema. Patient states she pain started about a week ago. Elevation has not helped. Patient reports his bed vibrates which has helped some. Not taking ibuprofen or tylenol . Will order an xray. Patient declines pain medication at this time.   Knee Pain  The incident occurred 5 to 7 days ago. There was no injury mechanism. The pain is present in the left knee. The quality of the pain is described as aching. The pain is mild. Associated symptoms include an inability to bear weight. He reports no foreign bodies present. The symptoms are aggravated by movement and weight bearing. He has tried non-weight bearing and rest for the symptoms. The treatment provided no relief.    No other concerns at this time.   Past Medical History:  Diagnosis Date   Anxiety    Aortic atherosclerosis (HCC)    Aortic dilatation (HCC) 01/10/2021   a.) CT 01/10/2021: infrarenal aortic dilitation measuring 3.3 x 3.3 cm. b.) CT 06/23/2021: measured 3.4 cm.   Arthritis    B12 deficiency    Basal cell carcinoma 03/14/2019   R nasal tip    Bilateral renal cysts    BPH (benign prostatic hyperplasia)    Bradycardia    Chronic kidney disease    COPD (chronic obstructive pulmonary disease) (HCC)    Coronary artery disease    a.) s/p 2v CABG in 2001; performed while living in [place] .   Diastolic dysfunction 06/2020   a.) TTE 06/2020: EF 60%; sev LA dilation, mild RA dilation; triv PR, mild to mod TR/MR/AR; G1DD.   Frequency of urination    GERD (gastroesophageal reflux disease)    Hiatal hernia    History of kidney stones    HLD (hyperlipidemia)    Hypertension    Internal hemorrhoids    Long term current use of anticoagulant     a.) Rivaroxaban   Melanoma (HCC) 11/11/2022   right infra orbital - Liquid nitrogen destruction 11/17/22 Imiquimod  cream started 12/30/22 used for ~6wks   Mobitz type 1 second degree AV block    OSA (obstructive sleep apnea)    a.) does not require nocturnal PAP therapy   Osteopenia    Pulmonary HTN (HCC) 06/2020   a.) mild   S/P CABG x 2 2001   a.) details unknown; performed in 2001 in [place] .    Past Surgical History:  Procedure Laterality Date   CARDIAC CATHETERIZATION     COLONOSCOPY     CORONARY ARTERY BYPASS GRAFT     CYSTOSCOPY/URETEROSCOPY/HOLMIUM LASER/STENT PLACEMENT Right 11/07/2021   Procedure: CYSTOSCOPY/URETEROSCOPY/HOLMIUM LASER/STENT PLACEMENT;  Surgeon: Francisca Redell BROCKS, MD;  Location: [facility] ORS;  Service: Urology;  Laterality: Right;   FRACTURE SURGERY Left    fractured left arm   HOLEP-LASER ENUCLEATION OF THE PROSTATE WITH MORCELLATION N/A 11/07/2021   Procedure: HOLEP-LASER ENUCLEATION OF THE PROSTATE WITH MORCELLATION;  Surgeon: Francisca Redell BROCKS, MD;  Location: [facility] ORS;  Service: Urology;  Laterality: N/A;   LIPOMA EXCISION N/A 11/21/2018   Procedure: EXCISION BACK LIPOMA;  Surgeon: Curvin Deward MOULD, MD;  Location: [facility] OR;  Service: General;  Laterality: N/A;   PILONIDAL CYST EXCISION     SKIN CANCER  EXCISION     TOTAL SHOULDER REPLACEMENT Left    UMBILICAL HERNIA REPAIR N/A 11/21/2018   Procedure: HERNIA REPAIR UMBILICAL ADULT WITH MESH;  Surgeon: Curvin Deward MOULD, MD;  Location: [facility] OR;  Service: General;  Laterality: N/A;    Social History   Socioeconomic History   Marital status: Widowed    Spouse name: Not on file   Number of children: Not on file   Years of education: Not on file   Highest education level: Not on file  Occupational History   Not on file  Tobacco Use   Smoking status: Former    Current packs/day: 0.00    Average packs/day: 1 pack/day for 20.0 years (20.0 ttl pk-yrs)    Types: Cigarettes    Start date: 09/21/1993    Quit date:  09/21/2013    Years since quitting: 10.6    Passive exposure: Past   Smokeless tobacco: Never   Tobacco comments:    quit on and off over the years  Vaping Use   Vaping status: Never Used  Substance and Sexual Activity   Alcohol use: Yes    Comment: socially   Drug use: No   Sexual activity: Yes    Birth control/protection: None  Other Topics Concern   Not on file  Social History Narrative   Not on file   Social Drivers of Health   Financial Resource Strain: Not on file  Food Insecurity: No Food Insecurity (08/16/2023)   Hunger Vital Sign    Worried About Running Out of Food in the Last Year: Never true    Ran Out of Food in the Last Year: Never true  Transportation Needs: No Transportation Needs (08/16/2023)   PRAPARE - Administrator, Civil Service (Medical): No    Lack of Transportation (Non-Medical): No  Physical Activity: Sufficiently Active (08/16/2023)   Exercise Vital Sign    Days of Exercise per Week: 7 days    Minutes of Exercise per Session: 60 min  Stress: Not on file  Social Connections: Not on file  Intimate Partner Violence: Not on file    Family History  Problem Relation Age of Onset   Prostate cancer Neg Hx    Bladder Cancer Neg Hx    Kidney cancer Neg Hx     No Known Allergies  Outpatient Medications Prior to Visit  Medication Sig   citalopram  (CELEXA ) 40 MG tablet Take 40 mg by mouth daily.   enalapril (VASOTEC) 20 MG tablet TAKE 1 TABLET BY MOUTH EVERY DAY   pantoprazole  (PROTONIX ) 40 MG tablet Take 1 tablet (40 mg total) by mouth daily.   rosuvastatin (CRESTOR) 10 MG tablet TAKE 1 TABLET BY MOUTH EVERY DAY   vitamin B-12 (CYANOCOBALAMIN) 500 MCG tablet    [DISCONTINUED] aspirin  EC 81 MG tablet Take 1 tablet (81 mg total) by mouth daily. Swallow whole. (Patient not taking: Reported on 05/05/2024)   [DISCONTINUED] Coenzyme Q10 (CO Q-10 MAXIMUM STRENGTH PO) Take 1 capsule by mouth daily. MAX Q 10 (Patient not taking: Reported on  05/05/2024)   [DISCONTINUED] Ferrous Sulfate Dried (SLOW RELEASE IRON) 45 MG TBCR Take 45 mg by mouth daily. (Patient not taking: Reported on 05/05/2024)   [DISCONTINUED] fluticasone (FLONASE) 50 MCG/ACT nasal spray Place 2 sprays into both nostrils daily as needed for allergies. (Patient not taking: Reported on 05/05/2024)   [DISCONTINUED] imiquimod  (ALDARA ) 5 % cream Apply topically as directed. Apply to area on right cheek 5 nights a week for 6  weeks (Patient not taking: Reported on 05/05/2024)   [DISCONTINUED] ondansetron  (ZOFRAN ) 4 MG tablet Take 1 tablet (4 mg total) by mouth every 8 (eight) hours as needed for nausea or vomiting. (Patient not taking: Reported on 05/05/2024)   No facility-administered medications prior to visit.    Review of Systems  Constitutional: Negative.   HENT: Negative.    Eyes: Negative.   Respiratory: Negative.  Negative for shortness of breath.   Cardiovascular: Negative.  Negative for chest pain.  Gastrointestinal: Negative.  Negative for abdominal pain, constipation and diarrhea.  Genitourinary: Negative.   Musculoskeletal:  Positive for joint pain. Negative for myalgias.  Skin: Negative.   Neurological: Negative.  Negative for dizziness and headaches.  Endo/Heme/Allergies: Negative.   All other systems reviewed and are negative.      Objective:   BP (!) 140/80   Pulse 72   Ht 5' 9 (1.753 m)   Wt 177 lb (80.3 kg)   SpO2 95%   BMI 26.14 kg/m   Vitals:   05/05/24 1353  BP: (!) 140/80  Pulse: 72  Height: 5' 9 (1.753 m)  Weight: 177 lb (80.3 kg)  SpO2: 95%  BMI (Calculated): 26.13    Physical Exam Nursing note reviewed.  Constitutional:      Appearance: Normal appearance. He is normal weight.  HENT:     Head: Normocephalic and atraumatic.     Nose: Nose normal.     Mouth/Throat:     Mouth: Mucous membranes are moist.     Pharynx: Oropharynx is clear.  Eyes:     Extraocular Movements: Extraocular movements intact.      Conjunctiva/sclera: Conjunctivae normal.     Pupils: Pupils are equal, round, and reactive to light.  Cardiovascular:     Rate and Rhythm: Normal rate and regular rhythm.     Pulses: Normal pulses.     Heart sounds: Normal heart sounds.  Pulmonary:     Effort: Pulmonary effort is normal.     Breath sounds: Normal breath sounds.  Abdominal:     General: Abdomen is flat. Bowel sounds are normal.     Palpations: Abdomen is soft.  Musculoskeletal:        General: Normal range of motion.     Cervical back: Normal range of motion.  Skin:    General: Skin is warm and dry.  Neurological:     General: No focal deficit present.     Mental Status: He is alert and oriented to person, place, and time.  Psychiatric:        Mood and Affect: Mood normal.        Behavior: Behavior normal.        Thought Content: Thought content normal.        Judgment: Judgment normal.      No results found for any visits on 05/05/24.  Recent Results (from the past 2160 hours)  Sed Rate (ESR)     Status: None   Collection Time: 02/10/24  2:01 PM  Result Value Ref Range   Sed Rate 6 0 - 30 mm/hr  CK, total     Status: None   Collection Time: 02/10/24  2:01 PM  Result Value Ref Range   Total CK 54 30 - 208 U/L  POCT XPERT XPRESS SARS COVID-2/FLU/RSV [[id number]]     Status: Normal   Collection Time: 02/10/24  2:41 PM  Result Value Ref Range   SARS Coronavirus 2 Negative    FLU A Negative  FLU B Negative    RSV RNA, PCR Negative       Assessment & Plan:  Left knee xray today Tylenol  as needed for pain  Problem List Items Addressed This Visit       Other   Acute pain of left knee - Primary   Relevant Orders   DG Knee Complete 4 Views Left    Return if symptoms worsen or fail to improve, for as scheduled.   Total time spent: 25 minutes  [employer], NP  05/05/2024   This document may have been prepared by Dragon Voice Recognition software and as such may include unintentional  dictation errors.

## 2024-05-11 ENCOUNTER — Ambulatory Visit: Payer: Self-pay | Admitting: Cardiology

## 2024-05-12 ENCOUNTER — Other Ambulatory Visit: Payer: Self-pay | Admitting: Cardiology

## 2024-05-12 DIAGNOSIS — M25562 Pain in left knee: Secondary | ICD-10-CM

## 2024-05-12 NOTE — Progress Notes (Signed)
 Patient informed.

## 2024-05-12 NOTE — Progress Notes (Signed)
 Patient has LM asking for call back about his results

## 2024-05-16 NOTE — Progress Notes (Signed)
   05/16/2024  Patient ID: James [name], male   DOB: 12/25/1935, 88 y.o.   MRN: [id number]  Pharmacy Quality Measure Review  This patient is appearing on a report for being at risk of failing the adherence measure for cholesterol (statin) and hypertension (ACEi/ARB) medications this calendar year.   Medication: Enalapril and Rosuvastatin Last fill date: 04/10/24 for 90 day supply  Insurance report was not up to date. No action needed at this time.   Jon VEAR Lindau, PharmD Clinical Pharmacist [phone]

## 2024-05-19 ENCOUNTER — Emergency Department
Admission: EM | Admit: 2024-05-19 | Discharge: 2024-05-19 | Disposition: A | Discharge status: Home / Self Care | Attending: Emergency Medicine | Admitting: Emergency Medicine

## 2024-05-19 ENCOUNTER — Other Ambulatory Visit: Payer: Self-pay

## 2024-05-19 DIAGNOSIS — S8392XA Sprain of unspecified site of left knee, initial encounter: Secondary | ICD-10-CM | POA: Diagnosis not present

## 2024-05-19 DIAGNOSIS — S838X2A Sprain of other specified parts of left knee, initial encounter: Secondary | ICD-10-CM | POA: Diagnosis not present

## 2024-05-19 DIAGNOSIS — S8992XA Unspecified injury of left lower leg, initial encounter: Secondary | ICD-10-CM | POA: Diagnosis present

## 2024-05-19 DIAGNOSIS — X501XXA Overexertion from prolonged static or awkward postures, initial encounter: Secondary | ICD-10-CM | POA: Insufficient documentation

## 2024-05-19 NOTE — ED Triage Notes (Signed)
 Pt sts that three weeks ago he twisted his knee and it has not gotten any better

## 2024-05-19 NOTE — ED Provider Notes (Signed)
   [facility] Provider Note    Event Date/Time   First MD Initiated Contact with Patient 05/19/24 1356     (approximate)   History   Knee Pain   HPI  James [name] is a 88 y.o. male who presents with complaints of left knee pain.  Patient reports he twisted his left knee about 3 weeks ago while doing squats.  2 days ago he injured it again, it continues to be quite sore.  He saw his PCP who apparently referred him to orthopedics but he has not been able to schedule with them.  Has not been taking anything for this.     Physical Exam   Triage Vital Signs: ED Triage Vitals  Encounter Vitals Group     BP 05/19/24 1322 (!) 156/97     Girls Systolic BP Percentile --      Girls Diastolic BP Percentile --      Boys Systolic BP Percentile --      Boys Diastolic BP Percentile --      Pulse Rate 05/19/24 1322 99     Resp 05/19/24 1322 17     Temp 05/19/24 1322 98 F (36.7 C)     Temp Source 05/19/24 1322 Oral     SpO2 05/19/24 1322 93 %     Weight 05/19/24 1321 80.3 kg (177 lb)     Height 05/19/24 1321 1.753 m (5' 9)     Head Circumference --      Peak Flow --      Pain Score 05/19/24 1321 5     Pain Loc --      Pain Education --      Exclude from Growth Chart --     Most recent vital signs: Vitals:   05/19/24 1322  BP: (!) 156/97  Pulse: 99  Resp: 17  Temp: 98 F (36.7 C)  SpO2: 93%     General: Awake, no distress.  CV:  Good peripheral perfusion.  Resp:  Normal effort.  Abd:  No distention.  Other:  Left knee: Reassuring exam, no effusion, good range of motion, no laxity, warm well-perfused, no calf pain or swelling   ED Results / Procedures / Treatments   Labs (all labs ordered are listed, but only abnormal results are displayed) Labs Reviewed - No data to display   EKG     RADIOLOGY     PROCEDURES:  Critical Care performed:   Procedures   MEDICATIONS ORDERED IN ED: Medications - No data to  display   IMPRESSION / MDM / ASSESSMENT AND PLAN / ED COURSE  I reviewed the triage vital signs and the nursing notes. Patient's presentation is most consistent with acute complicated illness / injury requiring diagnostic workup.  Patient reports normal x-ray with his PCP, no fracture, exam is consistent with likely ligamentous versus meniscus injury, will put him in a knee immobilizer, provide walker and refer to orthopedics, recommend icing, Tylenol         FINAL CLINICAL IMPRESSION(S) / ED DIAGNOSES   Final diagnoses:  Sprain of left knee, unspecified ligament, initial encounter     Rx / DC Orders   ED Discharge Orders     None        Note:  This document was prepared using Dragon voice recognition software and may include unintentional dictation errors.   Arlander Charleston, MD 05/19/24 [phone]

## 2024-05-30 DIAGNOSIS — M1712 Unilateral primary osteoarthritis, left knee: Secondary | ICD-10-CM | POA: Diagnosis not present

## 2024-06-13 ENCOUNTER — Ambulatory Visit: Admitting: Dermatology

## 2024-06-13 ENCOUNTER — Encounter: Payer: Self-pay | Admitting: Dermatology

## 2024-06-13 DIAGNOSIS — W908XXA Exposure to other nonionizing radiation, initial encounter: Secondary | ICD-10-CM

## 2024-06-13 DIAGNOSIS — L820 Inflamed seborrheic keratosis: Secondary | ICD-10-CM | POA: Diagnosis not present

## 2024-06-13 DIAGNOSIS — L821 Other seborrheic keratosis: Secondary | ICD-10-CM | POA: Diagnosis not present

## 2024-06-13 DIAGNOSIS — D692 Other nonthrombocytopenic purpura: Secondary | ICD-10-CM

## 2024-06-13 DIAGNOSIS — Z7189 Other specified counseling: Secondary | ICD-10-CM

## 2024-06-13 DIAGNOSIS — D1801 Hemangioma of skin and subcutaneous tissue: Secondary | ICD-10-CM | POA: Diagnosis not present

## 2024-06-13 DIAGNOSIS — Z1283 Encounter for screening for malignant neoplasm of skin: Secondary | ICD-10-CM | POA: Diagnosis not present

## 2024-06-13 DIAGNOSIS — Z86006 Personal history of melanoma in-situ: Secondary | ICD-10-CM | POA: Diagnosis not present

## 2024-06-13 DIAGNOSIS — L570 Actinic keratosis: Secondary | ICD-10-CM

## 2024-06-13 DIAGNOSIS — Z85820 Personal history of malignant melanoma of skin: Secondary | ICD-10-CM

## 2024-06-13 DIAGNOSIS — D229 Melanocytic nevi, unspecified: Secondary | ICD-10-CM

## 2024-06-13 DIAGNOSIS — L814 Other melanin hyperpigmentation: Secondary | ICD-10-CM | POA: Diagnosis not present

## 2024-06-13 DIAGNOSIS — Z85828 Personal history of other malignant neoplasm of skin: Secondary | ICD-10-CM | POA: Diagnosis not present

## 2024-06-13 DIAGNOSIS — L578 Other skin changes due to chronic exposure to nonionizing radiation: Secondary | ICD-10-CM

## 2024-06-13 NOTE — Patient Instructions (Addendum)

## 2024-06-13 NOTE — Progress Notes (Signed)
 Follow-Up Visit   Subjective  James [name] is a 88 y.o. 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 The patient has spots, moles and lesions to be evaluated, some may be new or changing and the patient may have concern these could be cancer.  The following portions of the chart were reviewed this encounter and updated as appropriate: medications, allergies, medical history  Review of Systems:  No other skin or systemic complaints except as noted in HPI or Assessment and Plan.  Objective  Well appearing patient in no apparent distress; mood and affect are within normal limits.  A full examination was performed including scalp, head, eyes, ears, nose, lips, neck, chest, axillae, abdomen, back, buttocks, bilateral upper extremities, bilateral lower extremities, hands, feet, fingers, toes, fingernails, and toenails. All findings within normal limits unless otherwise noted below.   Relevant physical exam findings are noted in the Assessment and Plan.  R infraorbital   Scalp, face x 17 (17) Pink scaly macules scalp, face x 5 (5) Stuck on waxy paps with erythema  Assessment & Plan   SKIN CANCER SCREENING PERFORMED TODAY.  ACTINIC DAMAGE - Chronic condition, secondary to cumulative UV/sun exposure - diffuse scaly erythematous macules with underlying dyspigmentation - Recommend daily broad spectrum sunscreen SPF 30+ to sun-exposed areas, reapply every 2 hours as needed.  - Staying in the shade or wearing long sleeves, sun glasses (UVA+UVB protection) and wide brim hats (4-inch brim around the entire circumference of the hat) are also recommended for sun protection.  - Call for new or changing lesions.  LENTIGINES, SEBORRHEIC KERATOSES, HEMANGIOMAS - Benign normal skin lesions - Benign-appearing -  Call for any changes  MELANOCYTIC NEVI - Tan-brown and/or pink-flesh-colored symmetric macules and papules - Benign appearing on exam today - Observation - Call clinic for new or changing moles - Recommend daily use of broad spectrum spf 30+ sunscreen to sun-exposed areas.   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 Due to the low aggressive nature of these lentigo maligna types, and the patient's age, and the size of the lesion which would require a large area of the face to be removed, the patient decided on liquid nitrogen destruction followed by topical chemotherapy as a treatment of choice. He understands he may need further surgical procedure if this treatment does not work. At this point with the area being clear on exam today, the patient declines any further evaluation or treatment. We will continue to examine and observe for recurrence.  - No lymphadenopathy - No evidence of recurrence today  - Recommend regular full body skin exams - Recommend daily broad spectrum sunscreen SPF 30+ to sun-exposed areas, reapply every 2 hours as needed.  - Call if any new or changing lesions are noted between office visits    HISTORY OF BASAL CELL CARCINOMA OF THE SKIN - No evidence of recurrence today - Recommend regular full body skin exams - Recommend daily broad spectrum sunscreen SPF 30+ to sun-exposed areas, reapply every 2 hours as needed.  - Call if any new or changing lesions are noted between office visits  - R nasal tip  Purpura - Chronic; persistent and recurrent.  Treatable, but not curable. -  Violaceous macules and patches - Benign - Related to trauma, age, sun damage and/or use of blood thinners, chronic use of topical and/or oral steroids - Observe - Can use OTC arnica containing moisturizer such as Dermend Bruise Formula if desired - Call for worsening or  other concerns  AK (ACTINIC KERATOSIS) (17) Scalp, face x 17 (17) Actinic keratoses are precancerous spots that appear secondary to cumulative UV radiation exposure/sun exposure over time. They are chronic with expected duration over 1 year. A portion of actinic keratoses will progress to squamous cell carcinoma of the skin. It is not possible to reliably predict which spots will progress to skin cancer and so treatment is recommended to prevent development of skin cancer.  Recommend daily broad spectrum sunscreen SPF 30+ to sun-exposed areas, reapply every 2 hours as needed.  Recommend staying in the shade or wearing long sleeves, sun glasses (UVA+UVB protection) and wide brim hats (4-inch brim around the entire circumference of the hat). Call for new or changing lesions. Destruction of lesion - Scalp, face x 17 (17) Complexity: simple   Destruction method: cryotherapy   Informed consent: discussed and consent obtained   Timeout:  patient name, date of birth, surgical site, and procedure verified Lesion destroyed using liquid nitrogen: Yes   Region frozen until ice ball extended beyond lesion: Yes   Outcome: patient tolerated procedure well with no complications   Post-procedure details: wound care instructions given    INFLAMED SEBORRHEIC KERATOSIS (5) scalp, face x 5 (5) Symptomatic, irritating, patient would like treated. 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  I, Grayce Saunas, RMA, am acting as scribe for [name], MD .   Documentation: I have reviewed the above documentation for accuracy and completeness, and I agree with the above.  [name], MD

## 2024-06-20 ENCOUNTER — Other Ambulatory Visit: Payer: Self-pay | Admitting: Orthopedic Surgery

## 2024-06-20 DIAGNOSIS — R2689 Other abnormalities of gait and mobility: Secondary | ICD-10-CM

## 2024-06-20 DIAGNOSIS — M1712 Unilateral primary osteoarthritis, left knee: Secondary | ICD-10-CM | POA: Diagnosis not present

## 2024-06-20 DIAGNOSIS — M25562 Pain in left knee: Secondary | ICD-10-CM | POA: Diagnosis not present

## 2024-06-21 ENCOUNTER — Ambulatory Visit
Admission: RE | Admit: 2024-06-21 | Discharge: 2024-06-21 | Disposition: A | Discharge status: Home / Self Care | Source: Ambulatory Visit | Attending: Orthopedic Surgery | Admitting: Orthopedic Surgery

## 2024-06-21 DIAGNOSIS — M7122 Synovial cyst of popliteal space [Baker], left knee: Secondary | ICD-10-CM | POA: Diagnosis not present

## 2024-06-21 DIAGNOSIS — R2689 Other abnormalities of gait and mobility: Secondary | ICD-10-CM | POA: Diagnosis not present

## 2024-06-21 DIAGNOSIS — S83272A Complex tear of lateral meniscus, current injury, left knee, initial encounter: Secondary | ICD-10-CM | POA: Diagnosis not present

## 2024-06-21 DIAGNOSIS — R600 Localized edema: Secondary | ICD-10-CM | POA: Diagnosis not present

## 2024-06-21 DIAGNOSIS — S8992XD Unspecified injury of left lower leg, subsequent encounter: Secondary | ICD-10-CM | POA: Diagnosis not present

## 2024-06-21 DIAGNOSIS — M25562 Pain in left knee: Secondary | ICD-10-CM | POA: Diagnosis not present

## 2024-06-27 DIAGNOSIS — I10 Essential (primary) hypertension: Secondary | ICD-10-CM | POA: Diagnosis not present

## 2024-06-27 DIAGNOSIS — J449 Chronic obstructive pulmonary disease, unspecified: Secondary | ICD-10-CM | POA: Diagnosis not present

## 2024-06-27 DIAGNOSIS — K219 Gastro-esophageal reflux disease without esophagitis: Secondary | ICD-10-CM | POA: Diagnosis not present

## 2024-06-27 DIAGNOSIS — Z604 Social exclusion and rejection: Secondary | ICD-10-CM | POA: Diagnosis not present

## 2024-06-27 DIAGNOSIS — I2510 Atherosclerotic heart disease of native coronary artery without angina pectoris: Secondary | ICD-10-CM | POA: Diagnosis not present

## 2024-06-27 DIAGNOSIS — Z951 Presence of aortocoronary bypass graft: Secondary | ICD-10-CM | POA: Diagnosis not present

## 2024-06-27 DIAGNOSIS — M1712 Unilateral primary osteoarthritis, left knee: Secondary | ICD-10-CM | POA: Diagnosis not present

## 2024-06-27 DIAGNOSIS — Z791 Long term (current) use of non-steroidal anti-inflammatories (NSAID): Secondary | ICD-10-CM | POA: Diagnosis not present

## 2024-06-30 ENCOUNTER — Ambulatory Visit (INDEPENDENT_AMBULATORY_CARE_PROVIDER_SITE_OTHER): Admitting: Internal Medicine

## 2024-06-30 ENCOUNTER — Encounter: Payer: Self-pay | Admitting: Internal Medicine

## 2024-06-30 VITALS — BP 116/78 | HR 56 | Ht 69.0 in | Wt 176.4 lb

## 2024-06-30 DIAGNOSIS — E782 Mixed hyperlipidemia: Secondary | ICD-10-CM | POA: Diagnosis not present

## 2024-06-30 DIAGNOSIS — R2681 Unsteadiness on feet: Secondary | ICD-10-CM

## 2024-06-30 DIAGNOSIS — Z23 Encounter for immunization: Secondary | ICD-10-CM | POA: Diagnosis not present

## 2024-06-30 DIAGNOSIS — I25810 Atherosclerosis of coronary artery bypass graft(s) without angina pectoris: Secondary | ICD-10-CM | POA: Diagnosis not present

## 2024-06-30 DIAGNOSIS — I10 Essential (primary) hypertension: Secondary | ICD-10-CM

## 2024-06-30 DIAGNOSIS — H6123 Impacted cerumen, bilateral: Secondary | ICD-10-CM | POA: Diagnosis not present

## 2024-06-30 DIAGNOSIS — M25562 Pain in left knee: Secondary | ICD-10-CM | POA: Diagnosis not present

## 2024-06-30 MED ORDER — CELECOXIB 200 MG PO CAPS: 200.0000 mg | ORAL_CAPSULE | Freq: Two times a day (BID) | ORAL | 3 refills | Status: AC

## 2024-06-30 NOTE — Progress Notes (Signed)
 Established Patient Office Visit  Subjective:  Patient ID: James [name], male    DOB: Nov 29, 1935  Age: 88 y.o. MRN: [id number]  Chief Complaint  Patient presents with   Follow-up    4 month follow up    Patient is here today for follow up. He is accompanied by his daughter Niels. She is helping him with getting medical directives in place including power of attorney paperwork. They will provide completed paperwork once it is notarized.  Patient had knee injury while working out that resulted in meniscus tear and it is being managed by his Orthopedic specialist. He has recently started PT/OT to help with rehabilitation from injury and gait instability. He is icing left knee twice a day and taking Ibuprofen 200 mg twice a day. Will stop Ibuprofen and switch to Celebrex  200 mg twice daily. Patient was previously referred to Neurology for gait evaluation for concerns with Parkinson's. He is recommended to Fu with them in 4-6 weeks after completing PT.  Patient has complaints of being HOH. He has seen audiology in the past but nothing came up of it. Today his ears have bilateral cerumen impaction. Recommend debrox ear drops 3 times a week and return in 1 week for ear wash. Daughter states she will reach out to audiologist for information on hearing aids for her father.  His daughter reports that he has been having issues falling out of the bed. OT has been helping with making his home more accessible. They are going today to get him bed rail, toilet rise. He already has walker and shower chair.  Patient wants a flu shot today. Otherwise patient is doing well and has no additional complaints at this time. Will order routine labs to be collected.    No other concerns at this time.   Past Medical History:  Diagnosis Date   Actinic keratosis    Anxiety    Aortic atherosclerosis    Aortic dilatation 01/10/2021   a.) CT 01/10/2021: infrarenal aortic dilitation measuring 3.3 x 3.3 cm. b.)  CT 06/23/2021: measured 3.4 cm.   Arthritis    B12 deficiency    Basal cell carcinoma 03/14/2019   R nasal tip    Bilateral renal cysts    BPH (benign prostatic hyperplasia)    Bradycardia    Chronic kidney disease    COPD (chronic obstructive pulmonary disease) (HCC)    Coronary artery disease    a.) s/p 2v CABG in 2001; performed while living in [place] .   Diastolic dysfunction 06/2020   a.) TTE 06/2020: EF 60%; sev LA dilation, mild RA dilation; triv PR, mild to mod TR/MR/AR; G1DD.   Frequency of urination    GERD (gastroesophageal reflux disease)    Hiatal hernia    History of kidney stones    HLD (hyperlipidemia)    Hypertension    Internal hemorrhoids    Long term current use of anticoagulant    a.) Rivaroxaban   Melanoma (HCC) 11/11/2022   right infra orbital - Liquid nitrogen destruction 11/17/22 Imiquimod  cream started 12/30/22 used for ~6wks, Clear 06/13/24   Mobitz type 1 second degree AV block    OSA (obstructive sleep apnea)    a.) does not require nocturnal PAP therapy   Osteopenia    Pulmonary HTN (HCC) 06/2020   a.) mild   S/P CABG x 2 2001   a.) details unknown; performed in 2001 in [place] .    Past Surgical History:  Procedure Laterality Date  CARDIAC CATHETERIZATION     COLONOSCOPY     CORONARY ARTERY BYPASS GRAFT     CYSTOSCOPY/URETEROSCOPY/HOLMIUM LASER/STENT PLACEMENT Right 11/07/2021   Procedure: CYSTOSCOPY/URETEROSCOPY/HOLMIUM LASER/STENT PLACEMENT;  Surgeon: Francisca Redell BROCKS, MD;  Location: [facility] ORS;  Service: Urology;  Laterality: Right;   FRACTURE SURGERY Left    fractured left arm   HOLEP-LASER ENUCLEATION OF THE PROSTATE WITH MORCELLATION N/A 11/07/2021   Procedure: HOLEP-LASER ENUCLEATION OF THE PROSTATE WITH MORCELLATION;  Surgeon: Francisca Redell BROCKS, MD;  Location: [facility] ORS;  Service: Urology;  Laterality: N/A;   LIPOMA EXCISION N/A 11/21/2018   Procedure: EXCISION BACK LIPOMA;  Surgeon: Curvin Deward MOULD, MD;  Location: [facility] OR;   Service: General;  Laterality: N/A;   PILONIDAL CYST EXCISION     SKIN CANCER EXCISION     TOTAL SHOULDER REPLACEMENT Left    UMBILICAL HERNIA REPAIR N/A 11/21/2018   Procedure: HERNIA REPAIR UMBILICAL ADULT WITH MESH;  Surgeon: Curvin Deward MOULD, MD;  Location: [facility] OR;  Service: General;  Laterality: N/A;    Social History   Socioeconomic History   Marital status: Widowed    Spouse name: Not on file   Number of children: Not on file   Years of education: Not on file   Highest education level: Not on file  Occupational History   Not on file  Tobacco Use   Smoking status: Former    Current packs/day: 0.00    Average packs/day: 1 pack/day for 20.0 years (20.0 ttl pk-yrs)    Types: Cigarettes    Start date: 09/21/1993    Quit date: 09/21/2013    Years since quitting: 10.7    Passive exposure: Past   Smokeless tobacco: Never   Tobacco comments:    quit on and off over the years  Vaping Use   Vaping status: Never Used  Substance and Sexual Activity   Alcohol use: Yes    Comment: socially   Drug use: No   Sexual activity: Yes    Birth control/protection: None  Other Topics Concern   Not on file  Social History Narrative   Not on file   Social Drivers of Health   Financial Resource Strain: Not on file  Food Insecurity: Food Insecurity Present (06/20/2024)   [facility]   Hunger Vital Sign    Within the past 12 months, you worried that your food would run out before you got the money to buy more.: Sometimes true    Within the past 12 months, the food you bought just didn't last and you didn't have money to get more.: Sometimes true  Transportation Needs: No Transportation Needs (06/20/2024)   Received from [facility] - Transportation    In the past 12 months, has lack of transportation kept you from medical appointments or from getting medications?: No    Lack of Transportation (Non-Medical): No  Physical Activity:  Sufficiently Active (08/16/2023)   Exercise Vital Sign    Days of Exercise per Week: 7 days    Minutes of Exercise per Session: 60 min  Stress: Not on file  Social Connections: Not on file  Intimate Partner Violence: Not on file    Family History  Problem Relation Age of Onset   Prostate cancer Neg Hx    Bladder Cancer Neg Hx    Kidney cancer Neg Hx     No Known Allergies  Outpatient Medications Prior to Visit  Medication Sig   citalopram  (CELEXA )  40 MG tablet Take 40 mg by mouth daily.   enalapril (VASOTEC) 20 MG tablet TAKE 1 TABLET BY MOUTH EVERY DAY   pantoprazole  (PROTONIX ) 40 MG tablet Take 1 tablet (40 mg total) by mouth daily.   [DISCONTINUED] ibuprofen (ADVIL) 200 MG tablet Take 200 mg by mouth 3 (three) times daily.   rosuvastatin (CRESTOR) 10 MG tablet TAKE 1 TABLET BY MOUTH EVERY DAY (Patient not taking: Reported on 06/30/2024)   vitamin B-12 (CYANOCOBALAMIN) 500 MCG tablet  (Patient not taking: Reported on 06/30/2024)   No facility-administered medications prior to visit.    Review of Systems  Constitutional: Negative.  Negative for chills, fever and malaise/fatigue.  HENT:  Positive for hearing loss. Negative for congestion and sore throat.   Eyes: Negative.  Negative for blurred vision and pain.  Respiratory: Negative.  Negative for cough and shortness of breath.   Cardiovascular: Negative.  Negative for chest pain, palpitations and leg swelling.  Gastrointestinal: Negative.  Negative for abdominal pain, blood in stool, constipation, diarrhea, heartburn, melena, nausea and vomiting.  Genitourinary: Negative.  Negative for dysuria, flank pain, frequency and urgency.  Musculoskeletal:  Positive for joint pain. Negative for myalgias.  Skin: Negative.   Neurological: Negative.  Negative for dizziness, tingling, sensory change, weakness and headaches.  Endo/Heme/Allergies: Negative.   Psychiatric/Behavioral: Negative.  Negative for depression and suicidal ideas.  The patient is not nervous/anxious.        Objective:   BP 116/78   Pulse (!) 56   Ht 5' 9 (1.753 m)   Wt 176 lb 6.4 oz (80 kg)   SpO2 97%   BMI 26.05 kg/m   Vitals:   06/30/24 1305  BP: 116/78  Pulse: (!) 56  Height: 5' 9 (1.753 m)  Weight: 176 lb 6.4 oz (80 kg)  SpO2: 97%  BMI (Calculated): 26.04    Physical Exam Vitals and nursing note reviewed.  Constitutional:      General: He is not in acute distress.    Appearance: Normal appearance. He is not ill-appearing.  HENT:     Head: Normocephalic and atraumatic.     Nose: Nose normal.     Mouth/Throat:     Mouth: Mucous membranes are moist.     Pharynx: Oropharynx is clear.  Eyes:     Conjunctiva/sclera: Conjunctivae normal.     Pupils: Pupils are equal, round, and reactive to light.  Cardiovascular:     Rate and Rhythm: Normal rate and regular rhythm.     Pulses: Normal pulses.     Heart sounds: Normal heart sounds.  Pulmonary:     Effort: Pulmonary effort is normal.     Breath sounds: Normal breath sounds. No wheezing or rhonchi.  Abdominal:     General: Bowel sounds are normal. There is no distension.     Palpations: Abdomen is soft.     Tenderness: There is no abdominal tenderness.  Musculoskeletal:        General: Signs of injury (meniscal tear left knee) present. Normal range of motion.     Cervical back: Normal range of motion and neck supple.     Right lower leg: No edema.     Left lower leg: No edema.  Skin:    General: Skin is warm and dry.     Capillary Refill: Capillary refill takes less than 2 seconds.  Neurological:     General: No focal deficit present.     Mental Status: He is alert and oriented to person,  place, and time.     Sensory: No sensory deficit.     Motor: No weakness.     Gait: Gait abnormal.  Psychiatric:        Mood and Affect: Mood normal.        Behavior: Behavior normal.        Judgment: Judgment normal.      No results found for any visits on 06/30/24.  No  results found for this or any previous visit (from the past 2160 hours).    Assessment & Plan:  Routine labs to be collected. Start Celebrex  200 mg twice daily. Stop Ibuprofen. Flu shot given today. Start debrox ear drops 3 times day for 1 week and return for ear flush. Problem List Items Addressed This Visit     Coronary atherosclerosis of autologous vein bypass graft   Relevant Orders   CBC with Diff   Essential hypertension, benign - Primary   Relevant Orders   CMP14+EGFR   Hyperlipidemia   Relevant Orders   Lipid Panel w/o Chol/HDL Ratio   Unsteady gait when walking   Bilateral impacted cerumen   Acute pain of left knee   Relevant Medications   celecoxib  (CELEBREX ) 200 MG capsule   Flu vaccine need   Relevant Orders   Flu Vaccine Trivalent High Dose (Fluad) (Completed)    Return in about 3 months (around 09/30/2024).   Total time spent: 25 minutes  [name], MD  06/30/2024   This document may have been prepared by [facility] Voice Recognition software and as such may include unintentional dictation errors.

## 2024-07-05 ENCOUNTER — Other Ambulatory Visit

## 2024-07-05 DIAGNOSIS — I25810 Atherosclerosis of coronary artery bypass graft(s) without angina pectoris: Secondary | ICD-10-CM | POA: Diagnosis not present

## 2024-07-05 DIAGNOSIS — E782 Mixed hyperlipidemia: Secondary | ICD-10-CM | POA: Diagnosis not present

## 2024-07-06 ENCOUNTER — Ambulatory Visit: Payer: Self-pay | Admitting: Internal Medicine

## 2024-07-06 LAB — CMP14+EGFR
ALT: 14 IU/L (ref 0–44)
AST: 19 IU/L (ref 0–40)
Albumin: 4.1 g/dL (ref 3.7–4.7)
Alkaline Phosphatase: 91 IU/L (ref 48–129)
BUN/Creatinine Ratio: 20 (ref 10–24)
BUN: 19 mg/dL (ref 8–27)
Bilirubin Total: 0.8 mg/dL (ref 0.0–1.2)
CO2: 23 mmol/L (ref 20–29)
Calcium: 10 mg/dL (ref 8.6–10.2)
Chloride: 105 mmol/L (ref 96–106)
Creatinine, Ser: 0.97 mg/dL (ref 0.76–1.27)
Globulin, Total: 2.4 g/dL (ref 1.5–4.5)
Glucose: 90 mg/dL (ref 70–99)
Potassium: 4.2 mmol/L (ref 3.5–5.2)
Sodium: 141 mmol/L (ref 134–144)
Total Protein: 6.5 g/dL (ref 6.0–8.5)
eGFR: 77 mL/min/1.73 (ref >59)

## 2024-07-06 LAB — CBC WITH DIFFERENTIAL/PLATELET
Basophils Absolute: 0.1 x10E3/uL (ref 0.0–0.2)
Basos: 1 %
EOS (ABSOLUTE): 0.1 x10E3/uL (ref 0.0–0.4)
Eos: 2 %
Hematocrit: 40.7 % (ref 37.5–51.0)
Hemoglobin: 13.2 g/dL (ref 13.0–17.7)
Immature Grans (Abs): 0 x10E3/uL (ref 0.0–0.1)
Immature Granulocytes: 0 %
Lymphocytes Absolute: 1.1 x10E3/uL (ref 0.7–3.1)
Lymphs: 19 %
MCH: 31.7 pg (ref 26.6–33.0)
MCHC: 32.4 g/dL (ref 31.5–35.7)
MCV: 98 fL — ABNORMAL HIGH (ref 79–97)
Monocytes Absolute: 0.6 x10E3/uL (ref 0.1–0.9)
Monocytes: 11 %
Neutrophils Absolute: 4.1 x10E3/uL (ref 1.4–7.0)
Neutrophils: 67 %
Platelets: 195 x10E3/uL (ref 150–450)
RBC: 4.17 x10E6/uL (ref 4.14–5.80)
RDW: 12.3 % (ref 11.6–15.4)
WBC: 6 x10E3/uL (ref 3.4–10.8)

## 2024-07-06 LAB — LIPID PANEL W/O CHOL/HDL RATIO
Cholesterol, Total: 119 mg/dL (ref 100–199)
HDL: 54 mg/dL (ref >39)
LDL Chol Calc (NIH): 52 mg/dL (ref 0–99)
Triglycerides: 62 mg/dL (ref 0–149)
VLDL Cholesterol Cal: 13 mg/dL (ref 5–40)

## 2024-07-07 ENCOUNTER — Ambulatory Visit: Admitting: Internal Medicine

## 2024-07-07 ENCOUNTER — Encounter: Payer: Self-pay | Admitting: Internal Medicine

## 2024-07-07 DIAGNOSIS — H6123 Impacted cerumen, bilateral: Secondary | ICD-10-CM

## 2024-07-07 NOTE — Progress Notes (Signed)
Ear wash only

## 2024-07-11 NOTE — Progress Notes (Signed)
Pt informed

## 2024-07-14 ENCOUNTER — Telehealth: Payer: Self-pay

## 2024-07-14 NOTE — Progress Notes (Signed)
   07/14/2024  Patient ID: Belvie KANDICE Cornwall, male   DOB: 01-28-1936, 88 y.o.   MRN: [id number]  Pharmacy Quality Measure Review  This patient is appearing on a report for being at risk of failing the adherence measure for cholesterol (statin) and hypertension (ACEi/ARB) medications this calendar year.   Medication: Enalapril and Rosuvastatin Last fill date: 04/10/24 for 90 day supply  Spoke with patient. Has self-stopped his rosuvastatin, felt his cholesterol was looking really good and would prefer to stay off rest of the year. Reports he would be willing to consider restarting if lipid panel comes back elevated at recheck in Jan.  Still taking enalapril, waiting for [facility] to refill. Chart review shows they would need a new rx to fill, sent request to staff.  Jon VEAR Lindau, PharmD Clinical Pharmacist [phone]

## 2024-07-15 ENCOUNTER — Telehealth: Payer: Self-pay | Admitting: Cardiovascular Disease

## 2024-07-15 ENCOUNTER — Other Ambulatory Visit: Payer: Self-pay | Admitting: Internal Medicine

## 2024-07-15 DIAGNOSIS — I10 Essential (primary) hypertension: Secondary | ICD-10-CM

## 2024-07-15 DIAGNOSIS — K219 Gastro-esophageal reflux disease without esophagitis: Secondary | ICD-10-CM

## 2024-07-17 NOTE — Telephone Encounter (Signed)
 Left patient a voicemail asking to call the office.

## 2024-07-21 DIAGNOSIS — M84453A Pathological fracture, unspecified femur, initial encounter for fracture: Secondary | ICD-10-CM | POA: Diagnosis not present

## 2024-07-21 DIAGNOSIS — M1712 Unilateral primary osteoarthritis, left knee: Secondary | ICD-10-CM | POA: Diagnosis not present

## 2024-07-21 DIAGNOSIS — S83272A Complex tear of lateral meniscus, current injury, left knee, initial encounter: Secondary | ICD-10-CM | POA: Diagnosis not present

## 2024-08-07 DIAGNOSIS — H524 Presbyopia: Secondary | ICD-10-CM | POA: Diagnosis not present

## 2024-08-16 ENCOUNTER — Other Ambulatory Visit: Payer: Self-pay | Admitting: Cardiovascular Disease

## 2024-08-16 ENCOUNTER — Other Ambulatory Visit: Payer: Self-pay

## 2024-08-16 DIAGNOSIS — I10 Essential (primary) hypertension: Secondary | ICD-10-CM

## 2024-08-18 MED ORDER — ENALAPRIL MALEATE 20 MG PO TABS: 20.0000 mg | ORAL_TABLET | Freq: Every day | ORAL | 0 refills | Status: DC

## 2024-08-22 DIAGNOSIS — M1712 Unilateral primary osteoarthritis, left knee: Secondary | ICD-10-CM | POA: Diagnosis not present

## 2024-08-23 NOTE — Progress Notes (Signed)
   08/23/2024  Patient ID: James [name], male   DOB: 28-Jan-1936, 88 y.o.   MRN: [id number]  Pharmacy Quality Measure Review  This patient is appearing on a report for being at risk of failing the adherence measure for hypertension (ACEi/ARB) medications this calendar year.   Medication: Enalapril  Last fill date: 08/16/24 for 90 day supply  Insurance report was not up to date. No action needed at this time.   James [name], PharmD Clinical Pharmacist [phone]

## 2024-08-24 ENCOUNTER — Telehealth: Payer: Self-pay | Admitting: Internal Medicine

## 2024-08-24 ENCOUNTER — Other Ambulatory Visit: Payer: Self-pay | Admitting: Internal Medicine

## 2024-08-24 DIAGNOSIS — I10 Essential (primary) hypertension: Secondary | ICD-10-CM

## 2024-08-24 MED ORDER — ENALAPRIL MALEATE 20 MG PO TABS: 20.0000 mg | ORAL_TABLET | Freq: Every day | ORAL | 3 refills | Status: AC

## 2024-08-24 NOTE — Telephone Encounter (Signed)
 [place], PT with Home Health, left VM requesting verbal orders for PT.  Callback # [phone]   Called Medford back and had to leave message to return call.

## 2024-08-24 NOTE — Telephone Encounter (Signed)
 Patient called in reporting his BP being elevated. States that it is running around 173/106. States it has been running high for some time now. Says he had run out of his BP meds and is now back on them but this has happened before where he ran out of meds and then until he got back on them it ran high. Please advise on what he should do. He does not have transportation to come in today, but could work something out if needs to come in tomorrow.

## 2024-10-02 ENCOUNTER — Encounter: Payer: Self-pay | Admitting: Internal Medicine

## 2024-10-02 ENCOUNTER — Ambulatory Visit (INDEPENDENT_AMBULATORY_CARE_PROVIDER_SITE_OTHER): Payer: Medicare (Managed Care) | Admitting: Internal Medicine

## 2024-10-02 VITALS — BP 132/86 | HR 98 | Ht 69.0 in | Wt 181.0 lb

## 2024-10-02 DIAGNOSIS — H9113 Presbycusis, bilateral: Secondary | ICD-10-CM | POA: Diagnosis not present

## 2024-10-02 DIAGNOSIS — E782 Mixed hyperlipidemia: Secondary | ICD-10-CM

## 2024-10-02 DIAGNOSIS — M2041 Other hammer toe(s) (acquired), right foot: Secondary | ICD-10-CM | POA: Diagnosis not present

## 2024-10-02 DIAGNOSIS — F411 Generalized anxiety disorder: Secondary | ICD-10-CM | POA: Diagnosis not present

## 2024-10-02 DIAGNOSIS — I25810 Atherosclerosis of coronary artery bypass graft(s) without angina pectoris: Secondary | ICD-10-CM

## 2024-10-02 DIAGNOSIS — I10 Essential (primary) hypertension: Secondary | ICD-10-CM

## 2024-10-02 DIAGNOSIS — K219 Gastro-esophageal reflux disease without esophagitis: Secondary | ICD-10-CM

## 2024-10-02 NOTE — Progress Notes (Signed)
 "  Established Patient Office Visit  Subjective:  Patient ID: James [name], male    DOB: 11-30-1935  Age: 89 y.o. MRN: [id number]  Chief Complaint  Patient presents with   Follow-up    3 month follow up    Patient comes in for his follow-up today accompanied by his daughter.  His left knee pain has improved since his Monovisc injection, and ongoing physical therapy.  He carries a cane with him but is still little unsteady with his gait.  He is going to make an appointment to see the neurologist for further evaluation. Patient mentions overgrown toenails and there is a right hammertoe.  Will set up referral to the podiatrist. He is currently taking all his medications regularly.  His last labs were unremarkable, will skip this time. He takes Celexa  40 mg daily but would like to see a psychologist again, the one he had back in 2018.  Will look them up and send another referral. Denies chest pain, no shortness of breath and no palpitations. He is still having some hearing difficulties, will follow-up with the audiologist.    No other concerns at this time.   Past Medical History:  Diagnosis Date   Actinic keratosis    Anxiety    Aortic atherosclerosis    Aortic dilatation 01/10/2021   a.) CT 01/10/2021: infrarenal aortic dilitation measuring 3.3 x 3.3 cm. b.) CT 06/23/2021: measured 3.4 cm.   Arthritis    B12 deficiency    Basal cell carcinoma 03/14/2019   R nasal tip    Bilateral renal cysts    BPH (benign prostatic hyperplasia)    Bradycardia    Chronic kidney disease    COPD (chronic obstructive pulmonary disease) (HCC)    Coronary artery disease    a.) s/p 2v CABG in 2001; performed while living in [place] .   Diastolic dysfunction 06/2020   a.) TTE 06/2020: EF 60%; sev LA dilation, mild RA dilation; triv PR, mild to mod TR/MR/AR; G1DD.   Frequency of urination    GERD (gastroesophageal reflux disease)    Hiatal hernia    History of kidney stones    HLD  (hyperlipidemia)    Hypertension    Internal hemorrhoids    Long term current use of anticoagulant    a.) Rivaroxaban   Melanoma (HCC) 11/11/2022   right infra orbital - Liquid nitrogen destruction 11/17/22 Imiquimod  cream started 12/30/22 used for ~6wks, Clear 06/13/24   Mobitz type 1 second degree AV block    OSA (obstructive sleep apnea)    a.) does not require nocturnal PAP therapy   Osteopenia    Pulmonary HTN (HCC) 06/2020   a.) mild   S/P CABG x 2 2001   a.) details unknown; performed in 2001 in [place] .    Past Surgical History:  Procedure Laterality Date   CARDIAC CATHETERIZATION     COLONOSCOPY     CORONARY ARTERY BYPASS GRAFT     CYSTOSCOPY/URETEROSCOPY/HOLMIUM LASER/STENT PLACEMENT Right 11/07/2021   Procedure: CYSTOSCOPY/URETEROSCOPY/HOLMIUM LASER/STENT PLACEMENT;  Surgeon: Francisca Redell BROCKS, MD;  Location: [facility] ORS;  Service: Urology;  Laterality: Right;   FRACTURE SURGERY Left    fractured left arm   HOLEP-LASER ENUCLEATION OF THE PROSTATE WITH MORCELLATION N/A 11/07/2021   Procedure: HOLEP-LASER ENUCLEATION OF THE PROSTATE WITH MORCELLATION;  Surgeon: Francisca Redell BROCKS, MD;  Location: [facility] ORS;  Service: Urology;  Laterality: N/A;   LIPOMA EXCISION N/A 11/21/2018   Procedure: EXCISION BACK LIPOMA;  Surgeon: Curvin Mt III,  MD;  Location: [facility] OR;  Service: General;  Laterality: N/A;   PILONIDAL CYST EXCISION     SKIN CANCER EXCISION     TOTAL SHOULDER REPLACEMENT Left    UMBILICAL HERNIA REPAIR N/A 11/21/2018   Procedure: HERNIA REPAIR UMBILICAL ADULT WITH MESH;  Surgeon: Curvin Deward MOULD, MD;  Location: [facility] OR;  Service: General;  Laterality: N/A;    Social History   Socioeconomic History   Marital status: Widowed    Spouse name: Not on file   Number of children: Not on file   Years of education: Not on file   Highest education level: Not on file  Occupational History   Not on file  Tobacco Use   Smoking status: Former    Current packs/day: 0.00    Average  packs/day: 1 pack/day for 20.0 years (20.0 ttl pk-yrs)    Types: Cigarettes    Start date: 09/21/1993    Quit date: 09/21/2013    Years since quitting: 11.0    Passive exposure: Past   Smokeless tobacco: Never   Tobacco comments:    quit on and off over the years  Vaping Use   Vaping status: Never Used  Substance and Sexual Activity   Alcohol use: Yes    Comment: socially   Drug use: No   Sexual activity: Yes    Birth control/protection: None  Other Topics Concern   Not on file  Social History Narrative   Not on file   Social Drivers of Health   Tobacco Use: Medium Risk (10/02/2024)   Patient History    Smoking Tobacco Use: Former    Smokeless Tobacco Use: Never    Passive Exposure: Past  Physicist, Medical Strain: Low Risk  (08/22/2024)   Received from [facility]   Overall Financial Resource Strain (CARDIA)    Difficulty of Paying Living Expenses: Not very hard  Food Insecurity: No Food Insecurity (08/22/2024)   Received from [facility]   Epic    Within the past 12 months, you worried that your food would run out before you got the money to buy more.: Never true    Within the past 12 months, the food you bought just didn't last and you didn't have money to get more.: Never true  Recent Concern: Food Insecurity - Food Insecurity Present (06/20/2024)   Received from [facility]   Epic    Within the past 12 months, you worried that your food would run out before you got the money to buy more.: Sometimes true    Within the past 12 months, the food you bought just didn't last and you didn't have money to get more.: Sometimes true  Transportation Needs: No Transportation Needs (08/22/2024)   [facility] - Transportation    In the past 12 months, has lack of transportation kept you from medical appointments or from getting medications?: No    Lack of Transportation (Non-Medical): No   Physical Activity: Sufficiently Active (08/16/2023)   Exercise Vital Sign    Days of Exercise per Week: 7 days    Minutes of Exercise per Session: 60 min  Stress: Not on file  Social Connections: Not on file  Intimate Partner Violence: Not on file  Depression (PHQ2-9): Medium Risk (08/16/2023)   Depression (PHQ2-9)    PHQ-2 Score: 9  Alcohol Screen: Low Risk (08/16/2023)   Alcohol Screen    Last Alcohol Screening Score (AUDIT): 3  Housing: Low Risk  (08/22/2024)   Received from [facility]   Epic    In the last 12 months, was there a time when you were not able to pay the mortgage or rent on time?: No    In the past 12 months, how many times have you moved where you were living?: 0    At any time in the past 12 months, were you homeless or living in a shelter (including now)?: No  Recent Concern: Housing - High Risk (07/21/2024)   Received from [facility]   Epic    In the last 12 months, was there a time when you were not able to pay the mortgage or rent on time?: Yes    In the past 12 months, how many times have you moved where you were living?: 0    At any time in the past 12 months, were you homeless or living in a shelter (including now)?: No  Utilities: Not At Risk (08/22/2024)   Received from [facility]   Epic    In the past 12 months has the electric, gas, oil, or water company threatened to shut off services in your home?: No  Health Literacy: Adequate Health Literacy (08/16/2023)   B1300 Health Literacy    Frequency of need for help with medical instructions: Never    Family History  Problem Relation Age of Onset   Prostate cancer Neg Hx    Bladder Cancer Neg Hx    Kidney cancer Neg Hx     Allergies[1]  Show/hide medication list[2]  Review of Systems  Constitutional: Negative.  Negative for chills, fever and malaise/fatigue.  HENT: Negative.  Negative for congestion and sore throat.   Eyes: Negative.   Negative for blurred vision and pain.  Respiratory: Negative.  Negative for cough and shortness of breath.   Cardiovascular: Negative.  Negative for chest pain, palpitations and leg swelling.  Gastrointestinal: Negative.  Negative for abdominal pain, blood in stool, constipation, diarrhea, heartburn, melena, nausea and vomiting.  Genitourinary: Negative.  Negative for dysuria, flank pain, frequency and urgency.  Musculoskeletal:  Positive for joint pain. Negative for myalgias.  Skin: Negative.   Neurological: Negative.  Negative for dizziness, tingling, sensory change, weakness and headaches.  Endo/Heme/Allergies: Negative.   Psychiatric/Behavioral: Negative.  Negative for depression and suicidal ideas. The patient is not nervous/anxious.        Objective:   BP 132/86   Pulse 98   Ht 5' 9 (1.753 m)   Wt 181 lb (82.1 kg)   SpO2 95%   BMI 26.73 kg/m   Vitals:   10/02/24 1000  BP: 132/86  Pulse: 98  Height: 5' 9 (1.753 m)  Weight: 181 lb (82.1 kg)  SpO2: 95%  BMI (Calculated): 26.72    Physical Exam Vitals and nursing note reviewed.  Constitutional:      General: He is not in acute distress.    Appearance: Normal appearance. He is not ill-appearing.  HENT:     Head: Normocephalic and atraumatic.     Nose: Nose normal.     Mouth/Throat:     Mouth: Mucous membranes are moist.     Pharynx: Oropharynx is clear.  Eyes:     Conjunctiva/sclera: Conjunctivae normal.     Pupils: Pupils are equal, round, and reactive to light.  Cardiovascular:     Rate and Rhythm: Normal rate and regular rhythm.     Pulses: Normal pulses.  Heart sounds: Normal heart sounds.  Pulmonary:     Effort: Pulmonary effort is normal.     Breath sounds: Normal breath sounds. No wheezing or rhonchi.  Abdominal:     General: Bowel sounds are normal. There is no distension.     Palpations: Abdomen is soft.     Tenderness: There is no abdominal tenderness.  Musculoskeletal:        General:  Normal range of motion.     Cervical back: Normal range of motion and neck supple.     Right lower leg: No edema.     Left lower leg: No edema.  Skin:    General: Skin is warm and dry.     Capillary Refill: Capillary refill takes less than 2 seconds.  Neurological:     General: No focal deficit present.     Mental Status: He is alert and oriented to person, place, and time.     Sensory: No sensory deficit.     Motor: No weakness.  Psychiatric:        Mood and Affect: Mood normal.        Behavior: Behavior normal.        Judgment: Judgment normal.      No results found for any visits on 10/02/24.  Recent Results (from the past 2160 hours)  CMP14+EGFR     Status: None   Collection Time: 07/05/24  9:54 AM  Result Value Ref Range   Glucose 90 70 - 99 mg/dL   BUN 19 8 - 27 mg/dL   Creatinine, Ser 9.02 0.76 - 1.27 mg/dL   eGFR 77 >40 fO/fpw/8.26   BUN/Creatinine Ratio 20 10 - 24   Sodium 141 134 - 144 mmol/L   Potassium 4.2 3.5 - 5.2 mmol/L   Chloride 105 96 - 106 mmol/L   CO2 23 20 - 29 mmol/L   Calcium 10.0 8.6 - 10.2 mg/dL   Total Protein 6.5 6.0 - 8.5 g/dL   Albumin 4.1 3.7 - 4.7 g/dL   Globulin, Total 2.4 1.5 - 4.5 g/dL   Bilirubin Total 0.8 0.0 - 1.2 mg/dL   Alkaline Phosphatase 91 48 - 129 IU/L   AST 19 0 - 40 IU/L   ALT 14 0 - 44 IU/L  Lipid Panel w/o Chol/HDL Ratio     Status: None   Collection Time: 07/05/24  9:54 AM  Result Value Ref Range   Cholesterol, Total 119 100 - 199 mg/dL   Triglycerides 62 0 - 149 mg/dL   HDL 54 >60 mg/dL   VLDL Cholesterol Cal 13 5 - 40 mg/dL   LDL Chol Calc (NIH) 52 0 - 99 mg/dL  CBC with Diff     Status: Abnormal   Collection Time: 07/05/24  9:54 AM  Result Value Ref Range   WBC 6.0 3.4 - 10.8 x10E3/uL   RBC 4.17 4.14 - 5.80 x10E6/uL   Hemoglobin 13.2 13.0 - 17.7 g/dL   Hematocrit 59.2 62.4 - 51.0 %   MCV 98 (H) 79 - 97 fL   MCH 31.7 26.6 - 33.0 pg   MCHC 32.4 31.5 - 35.7 g/dL   RDW 87.6 88.3 - 84.5 %   Platelets 195 150  - 450 x10E3/uL   Neutrophils 67 Not Estab. %   Lymphs 19 Not Estab. %   Monocytes 11 Not Estab. %   Eos 2 Not Estab. %   Basos 1 Not Estab. %   Neutrophils Absolute 4.1 1.4 - 7.0 x10E3/uL  Lymphocytes Absolute 1.1 0.7 - 3.1 x10E3/uL   Monocytes Absolute 0.6 0.1 - 0.9 x10E3/uL   EOS (ABSOLUTE) 0.1 0.0 - 0.4 x10E3/uL   Basophils Absolute 0.1 0.0 - 0.2 x10E3/uL   Immature Granulocytes 0 Not Estab. %   Immature Grans (Abs) 0.0 0.0 - 0.1 x10E3/uL      Assessment & Plan:  Continue current medications.  Referral sent to podiatry and psychologist. Will check labs at next visit. Problem List Items Addressed This Visit       Cardiovascular and Mediastinum   Coronary atherosclerosis of autologous vein bypass graft     Digestive   Gastroesophageal reflux disease without esophagitis     Nervous and Auditory   Bilateral hearing loss     Other   Hyperlipidemia   GAD (generalized anxiety disorder)   Relevant Orders   Ambulatory referral to Psychology   Other Visit Diagnoses       Essential hypertension    -  Primary     Hammer toe of right foot       Relevant Orders   Ambulatory referral to Podiatry       Return in about 3 months (around 12/31/2024).   Total time spent: 30 minutes. This time includes review of previous notes and results and patient face to face interaction during today's visit.    [name], MD  10/02/2024   This document may have been prepared by [facility] Voice Recognition software and as such may include unintentional dictation errors.      [1] No Known Allergies [2]  Outpatient Medications Prior to Visit  Medication Sig   celecoxib  (CELEBREX ) 200 MG capsule Take 1 capsule (200 mg total) by mouth 2 (two) times daily. (Patient taking differently: Take 200 mg by mouth daily.)   citalopram  (CELEXA ) 40 MG tablet Take 40 mg by mouth daily.   enalapril  (VASOTEC ) 20 MG tablet Take 1 tablet (20 mg total) by mouth daily.   pantoprazole  (PROTONIX ) 40 MG  tablet TAKE 1 TABLET BY MOUTH EVERY DAY   rosuvastatin (CRESTOR) 10 MG tablet TAKE 1 TABLET BY MOUTH EVERY DAY   vitamin B-12 (CYANOCOBALAMIN) 500 MCG tablet  (Patient not taking: Reported on 10/02/2024)   No facility-administered medications prior to visit.   "

## 2024-10-11 ENCOUNTER — Encounter: Payer: Self-pay | Admitting: Dermatology

## 2024-10-11 ENCOUNTER — Ambulatory Visit: Payer: Medicare (Managed Care)

## 2024-10-11 ENCOUNTER — Ambulatory Visit: Admitting: Dermatology

## 2024-10-11 ENCOUNTER — Ambulatory Visit (INDEPENDENT_AMBULATORY_CARE_PROVIDER_SITE_OTHER): Payer: Medicare (Managed Care)

## 2024-10-11 DIAGNOSIS — Z86006 Personal history of melanoma in-situ: Secondary | ICD-10-CM

## 2024-10-11 DIAGNOSIS — W908XXA Exposure to other nonionizing radiation, initial encounter: Secondary | ICD-10-CM

## 2024-10-11 DIAGNOSIS — D692 Other nonthrombocytopenic purpura: Secondary | ICD-10-CM

## 2024-10-11 DIAGNOSIS — B351 Tinea unguium: Secondary | ICD-10-CM | POA: Diagnosis not present

## 2024-10-11 DIAGNOSIS — Z85820 Personal history of malignant melanoma of skin: Secondary | ICD-10-CM

## 2024-10-11 DIAGNOSIS — L821 Other seborrheic keratosis: Secondary | ICD-10-CM

## 2024-10-11 DIAGNOSIS — L570 Actinic keratosis: Secondary | ICD-10-CM

## 2024-10-11 DIAGNOSIS — M2041 Other hammer toe(s) (acquired), right foot: Secondary | ICD-10-CM | POA: Diagnosis not present

## 2024-10-11 DIAGNOSIS — Z1283 Encounter for screening for malignant neoplasm of skin: Secondary | ICD-10-CM | POA: Diagnosis not present

## 2024-10-11 DIAGNOSIS — Z85828 Personal history of other malignant neoplasm of skin: Secondary | ICD-10-CM | POA: Diagnosis not present

## 2024-10-11 DIAGNOSIS — L578 Other skin changes due to chronic exposure to nonionizing radiation: Secondary | ICD-10-CM | POA: Diagnosis not present

## 2024-10-11 DIAGNOSIS — L820 Inflamed seborrheic keratosis: Secondary | ICD-10-CM | POA: Diagnosis not present

## 2024-10-11 DIAGNOSIS — L814 Other melanin hyperpigmentation: Secondary | ICD-10-CM

## 2024-10-11 DIAGNOSIS — D1801 Hemangioma of skin and subcutaneous tissue: Secondary | ICD-10-CM

## 2024-10-11 DIAGNOSIS — Z7189 Other specified counseling: Secondary | ICD-10-CM

## 2024-10-11 NOTE — Progress Notes (Signed)
" ° °  °  Subjective:  Patient ID: James [name], male    DOB: 1936-05-02,  MRN: [id number]  89 y.o. male presents with chief concern of elongated, thickened, painful, discolored toenails for months. Patient has tried self attempt at trimming toenail. He is unable to cut his own toenails due to decreased mobility. He also notes a hammertoe to the right second toe.   Chief Complaint  Patient presents with   James [name]    He states it only bothers him in certain shoes    PCP is [name], MD   Allergies[1]  Review of Systems: Negative except as noted in the HPI.   Objective:  James [name] is a pleasant 89 y.o. male in NAD. AAO x 3.  Vascular Examination: Vascular status intact b/l with palpable pedal pulses. CFT immediate b/l. Pedal hair present. No edema. No pain with calf compression b/l. Skin temperature gradient WNL b/l. No varicosities noted. No cyanosis or clubbing noted.  Neurological Examination: Sensation grossly intact b/l with 10 gram monofilament. Negative tinel sign at tarsal tunnel bilaterally.   Dermatological Examination: Pedal skin with normal turgor, texture and tone b/l. No open wounds nor interdigital macerations noted. Toenails 1-5 b/l thick, discolored, elongated with subungual debris and pain on dorsal palpation. No hyperkeratotic lesions noted b/l.   Musculoskeletal Examination: Muscle strength 5/5 to b/l LE.  No pain, crepitus noted b/l. Right second toe has rigid hammertoe deformity. No associated HKT lesion on dorsal PIPJ. No wound or sign of infection. Flexible HT deformities to digits 3-5 on R and 2-5 on L. Patient ambulates independently without assistive aids.   Radiographs: 3 weightbearing views of the right foot were taken.  These show joint space narrowing with osteophyte formation of the first tarsometatarsal joint consistent with arthritis.  Narrow sinus tarsi.  No acute osseous findings such as fracture or dislocation.  Last A1c:        No data to display           Assessment:   1. Onychomycosis   2. Hammertoe of right foot     Plan:  Consent given for treatment. Patient examined. All patient's and/or POA's questions/concerns addressed on today's visit. Toenails 1-5 b/l debrided in length and girth without incident. Continue soft, supportive shoe gear daily. Report any pedal injuries to medical professional. Call office if there are any questions/concerns. -Patient/POA to call should there be question/concern in the interim. - Patient can return to clinic as needed. We discussed close monitoring of his right second digit. Discussed use of shoegear with wider toe box to accommodate his hammertoe.   No follow-ups on file.  Prentice Ovens, DPM AACFAS Fellowship Trained Podiatric Surgeon [facility]       [place] LOCATION: [address], [place] [address]                   Office [phone]   [facility] LOCATION: [address] [place], [place] [address] Office [phone]     [1] No Known Allergies  "

## 2024-10-11 NOTE — Progress Notes (Signed)
 "  Follow-Up Visit   Subjective  James [name] is a 89 y.o. male who presents for the following: Skin Cancer Screening and Full Body Skin Exam Hx of melanoma in situ, hx of bcc Hx of aks and isks   Some spots at neck  The patient presents for Total-Body Skin Exam (TBSE) for skin cancer screening and mole check. The patient has spots, moles and lesions to be evaluated, some may be new or changing and the patient may have concern these could be cancer.  The following portions of the chart were reviewed this encounter and updated as appropriate: medications, allergies, medical history  Review of Systems:  No other skin or systemic complaints except as noted in HPI or Assessment and Plan.  Objective  Well appearing patient in no apparent distress; mood and affect are within normal limits.  A full examination was performed including scalp, head, eyes, ears, nose, lips, neck, chest, axillae, abdomen, back, buttocks, bilateral upper extremities, bilateral lower extremities, hands, feet, fingers, toes, fingernails, and toenails. All findings within normal limits unless otherwise noted below.   Relevant physical exam findings are noted in the Assessment and Plan. scalp and face x 12 (12) Erythematous thin papules/macules with gritty scale.  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 Due to the low aggressive nature of these lentigo maligna types, and the patient's age, and the size of the lesion which would require a large area of the face to be removed, the patient decided on liquid nitrogen destruction followed by topical chemotherapy as a treatment of choice. He understands he may need further surgical procedure if this treatment does not work. At this point with the area being clear on exam  today, the patient declines any further evaluation or treatment. We will continue to examine and observe for recurrence.  - No lymphadenopathy - No evidence of recurrence today  - Recommend regular full body skin exams - Recommend daily broad spectrum sunscreen SPF 30+ to sun-exposed areas, reapply every 2 hours as needed.  - Call if any new or changing lesions are noted between office visits    HISTORY OF BASAL CELL CARCINOMA OF THE SKIN 03/14/2019  right nasal tip  - No evidence of recurrence today - Recommend regular full body skin exams - Recommend daily broad spectrum sunscreen SPF 30+ to sun-exposed areas, reapply every 2 hours as needed.  - Call if any new or changing lesions are noted between office visits   SKIN CANCER SCREENING PERFORMED TODAY.  ACTINIC DAMAGE - Chronic condition, secondary to cumulative UV/sun exposure - diffuse scaly erythematous macules with underlying dyspigmentation - Recommend daily broad spectrum sunscreen SPF 30+ to sun-exposed areas, reapply every 2 hours as needed.  - Staying in the shade or wearing long sleeves, sun glasses (UVA+UVB protection) and wide brim hats (4-inch brim around the entire circumference of the hat) are also recommended for sun protection.  - Call for new or changing lesions.  LENTIGINES, SEBORRHEIC KERATOSES, HEMANGIOMAS - Benign normal skin lesions - Benign-appearing - Call for any changes  MELANOCYTIC NEVI - Tan-brown and/or pink-flesh-colored symmetric macules and papules - Benign appearing on exam today - Observation - Call clinic for new or changing moles - Recommend daily use of broad spectrum spf 30+ sunscreen to sun-exposed areas.  Purpura - Chronic; persistent and recurrent.  Treatable, but not curable. - Violaceous macules and patches - Benign - Related to trauma, age, sun damage and/or use of blood thinners, chronic use of topical and/or oral steroids - Observe - Can use OTC arnica containing moisturizer such  as Dermend Bruise Formula if desired - Call for worsening or other concerns ACTINIC KERATOSIS (12) scalp and face x 12 (12) Actinic keratoses are precancerous spots that appear secondary to cumulative UV radiation exposure/sun exposure over time. They are chronic with expected duration over 1 year. A portion of actinic keratoses will progress to squamous cell carcinoma of the skin. It is not possible to reliably predict which spots will progress to skin cancer and so treatment is recommended to prevent development of skin cancer.  Recommend daily broad spectrum sunscreen SPF 30+ to sun-exposed areas, reapply every 2 hours as needed.  Recommend staying in the shade or wearing long sleeves, sun glasses (UVA+UVB protection) and wide brim hats (4-inch brim around the entire circumference of the hat). Call for new or changing lesions. - Destruction of lesion - scalp and face x 12 (12) Complexity: simple   Destruction method: cryotherapy   Informed consent: discussed and consent obtained   Timeout:  patient name, date of birth, surgical site, and procedure verified Lesion destroyed using liquid nitrogen: Yes   Region frozen until ice ball extended beyond lesion: Yes   Outcome: patient tolerated procedure well with no complications   Post-procedure details: wound care instructions given    INFLAMED SEBORRHEIC KERATOSIS (6) face x 6 (6) Symptomatic, irritating, patient would like treated. - Destruction of lesion - face x 6 (6) Complexity: simple   Destruction method: cryotherapy   Informed consent: discussed and consent obtained   Timeout:  patient name, date of birth, surgical site, and procedure verified Lesion destroyed using liquid nitrogen: Yes   Region frozen until ice ball extended beyond lesion: Yes   Outcome: patient tolerated procedure well with no complications   Post-procedure details: wound care instructions given    Return in about 6 months (around 04/10/2025) for tbse hx of mmis  .  IEleanor [name], CMA, am acting as scribe for James Rhyme, MD.   Documentation: I have reviewed the above documentation for accuracy and completeness, and I agree with the above.  James Rhyme, MD     "

## 2024-10-11 NOTE — Patient Instructions (Addendum)
 Actinic keratoses are precancerous spots that appear secondary to cumulative UV radiation exposure/sun exposure over time. They are chronic with expected duration over 1 year. A portion of actinic keratoses will progress to squamous cell carcinoma of the skin. It is not possible to reliably predict which spots will progress to skin cancer and so treatment is recommended to prevent development of skin cancer.  Recommend daily broad spectrum sunscreen SPF 30+ to sun-exposed areas, reapply every 2 hours as needed.  Recommend staying in the shade or wearing long sleeves, sun glasses (UVA+UVB protection) and wide brim hats (4-inch brim around the entire circumference of the hat). Call for new or changing lesions.     Cryotherapy Aftercare  Wash gently with soap and water everyday.   Apply Vaseline and Band-Aid daily until healed.    Seborrheic Keratosis  What causes seborrheic keratoses? Seborrheic keratoses are harmless, common skin growths that first appear during adult life.  As time goes by, more growths appear.  Some people may develop a large number of them.  Seborrheic keratoses appear on both covered and uncovered body parts.  They are not caused by sunlight.  The tendency to develop seborrheic keratoses can be inherited.  They vary in color from skin-colored to gray, brown, or even black.  They can be either smooth or have a rough, warty surface.   Seborrheic keratoses are superficial and look as if they were stuck on the skin.  Under the microscope this type of keratosis looks like layers upon layers of skin.  That is why at times the top layer may seem to fall off, but the rest of the growth remains and re-grows.    Treatment Seborrheic keratoses do not need to be treated, but can easily be removed in the office.  Seborrheic keratoses often cause symptoms when they rub on clothing or jewelry.  Lesions can be in the way of shaving.  If they become inflamed, they can cause itching, soreness,  or burning.  Removal of a seborrheic keratosis can be accomplished by freezing, burning, or surgery. If any spot bleeds, scabs, or grows rapidly, please return to have it checked, as these can be an indication of a skin cancer.   Melanoma ABCDEs  Melanoma is the most dangerous type of skin cancer, and is the leading cause of death from skin disease.  You are more likely to develop melanoma if you: Have light-colored skin, light-colored eyes, or red or blond hair Spend a lot of time in the sun Tan regularly, either outdoors or in a tanning bed Have had blistering sunburns, especially during childhood Have a close family member who has had a melanoma Have atypical moles or large birthmarks  Early detection of melanoma is key since treatment is typically straightforward and cure rates are extremely high if we catch it early.   The first sign of melanoma is often a change in a mole or a new dark spot.  The ABCDE system is a way of remembering the signs of melanoma.  A for asymmetry:  The two halves do not match. B for border:  The edges of the growth are irregular. C for color:  A mixture of colors are present instead of an even brown color. D for diameter:  Melanomas are usually (but not always) greater than 6mm - the size of a pencil eraser. E for evolution:  The spot keeps changing in size, shape, and color.  Please check your skin once per month between visits. You can  use a small mirror in front and a large mirror behind you to keep an eye on the back side or your body.   If you see any new or changing lesions before your next follow-up, please call to schedule a visit.  Please continue daily skin protection including broad spectrum sunscreen SPF 30+ to sun-exposed areas, reapplying every 2 hours as needed when you're outdoors.   Staying in the shade or wearing long sleeves, sun glasses (UVA+UVB protection) and wide brim hats (4-inch brim around the entire circumference of the hat) are  also recommended for sun protection.    Due to recent changes in healthcare laws, you may see results of your pathology and/or laboratory studies on MyChart before the doctors have had a chance to review them. We understand that in some cases there may be results that are confusing or concerning to you. Please understand that not all results are received at the same time and often the doctors may need to interpret multiple results in order to provide you with the best plan of care or course of treatment. Therefore, we ask that you please give us  2 business days to thoroughly review all your results before contacting the office for clarification. Should we see a critical lab result, you will be contacted sooner.   If You Need Anything After Your Visit  If you have any questions or concerns for your doctor, please call our main line at [phone] and press option 4 to reach your doctor's medical assistant. If no one answers, please leave a voicemail as directed and we will return your call as soon as possible. Messages left after 4 pm will be answered the following business day.   You may also send us  a message via MyChart. We typically respond to MyChart messages within 1-2 business days.  For prescription refills, please ask your pharmacy to contact our office. Our fax number is [phone].  If you have an urgent issue when the clinic is closed that cannot wait until the next business day, you can page your doctor at the number below.    Please note that while we do our best to be available for urgent issues outside of office hours, we are not available 24/7.   If you have an urgent issue and are unable to reach us , you may choose to seek medical care at your doctor's office, retail clinic, urgent care center, or emergency room.  If you have a medical emergency, please immediately call 911 or go to the emergency department.  Pager Numbers  - Dr. Hester: [phone]  - Dr. Jackquline:  [phone]  - Dr. Claudene: [phone]   - Dr. Raymund: [phone]  In the event of inclement weather, please call our main line at [phone] for an update on the status of any delays or closures.  Dermatology Medication Tips: Please keep the boxes that topical medications come in in order to help keep track of the instructions about where and how to use these. Pharmacies typically print the medication instructions only on the boxes and not directly on the medication tubes.   If your medication is too expensive, please contact our office at [phone] option 4 or send us  a message through MyChart.   We are unable to tell what your co-pay for medications will be in advance as this is different depending on your insurance coverage. However, we may be able to find a substitute medication at lower cost or fill out paperwork to get insurance to cover  a needed medication.   If a prior authorization is required to get your medication covered by your insurance company, please allow us  1-2 business days to complete this process.  Drug prices often vary depending on where the prescription is filled and some pharmacies may offer cheaper prices.  The website www.goodrx.com contains coupons for medications through different pharmacies. The prices here do not account for what the cost may be with help from insurance (it may be cheaper with your insurance), but the website can give you the price if you did not use any insurance.  - You can print the associated coupon and take it with your prescription to the pharmacy.  - You may also stop by our office during regular business hours and pick up a GoodRx coupon card.  - If you need your prescription sent electronically to a different pharmacy, notify our office through [facility] or by phone at [phone] option 4.     Si Usted Necesita Algo Despus de Su Visita  Tambin puede enviarnos un mensaje a travs de Clinical cytogeneticist. Por lo general  respondemos a los mensajes de MyChart en el transcurso de 1 a 2 das hbiles.  Para renovar recetas, por favor pida a su farmacia que se ponga en contacto con nuestra oficina. Randi lakes de fax es [place] [phone].  Si tiene un asunto urgente cuando la clnica est cerrada y que no puede esperar hasta el siguiente da hbil, puede llamar/localizar a su doctor(a) al nmero que aparece a continuacin.   Por favor, tenga en cuenta que aunque hacemos todo lo posible para estar disponibles para asuntos urgentes fuera del horario de [place], no estamos disponibles las 24 horas del da, los 7 [address]  [address] de la [place].   Si tiene un problema urgente y no puede comunicarse con nosotros, puede optar por buscar atencin mdica  en el consultorio de su doctor(a), en una clnica privada, en un centro de atencin urgente o en una sala de emergencias.  Si tiene Engineer, drilling, por favor llame inmediatamente al 911 o vaya a la sala de emergencias.  Nmeros de bper  - Dr. Hester: [phone]  - Dra. Jackquline: [phone]  - Dr. Claudene: [phone]  - Dra. Kitts: [phone]  En caso de inclemencias del [place], por favor llame a nuestra lnea principal al [phone] para una actualizacin sobre el estado de cualquier retraso o cierre.  Consejos para la medicacin en dermatologa: Por favor, guarde las cajas en las que vienen los medicamentos de uso tpico para ayudarle a seguir las instrucciones sobre dnde y cmo usarlos. Las farmacias generalmente imprimen las instrucciones del medicamento slo en las cajas y no directamente en los tubos del [place].   Si su medicamento es muy caro, por favor, pngase en contacto con landry rieger llamando al [phone] y presione la opcin 4 o envenos un mensaje a travs de Clinical cytogeneticist.   No podemos decirle cul ser su copago por los medicamentos por adelantado ya que esto es diferente dependiendo de la cobertura de su seguro. Sin embargo, es posible  que podamos encontrar un medicamento sustituto a Audiological scientist un formulario para que el seguro cubra el medicamento que se considera necesario.   Si se requiere una autorizacin previa para que su compaa de seguros malta su medicamento, por favor permtanos de 1 a 2 das hbiles para completar este proceso.  Los precios de los medicamentos varan con frecuencia dependiendo del Environmental consultant de dnde se surte la receta y iraq  pueden ofrecer precios ms baratos.  El sitio web www.goodrx.com tiene cupones para medicamentos de Health and safety inspector. Los precios aqu no tienen en cuenta lo que podra costar con la ayuda del seguro (puede ser ms barato con su seguro), pero el sitio web puede darle el precio si no utiliz Tourist information centre manager.  - Puede imprimir el cupn correspondiente y llevarlo con su receta a la farmacia.  - Tambin puede pasar por nuestra oficina durante el horario de atencin regular y Education officer, museum una tarjeta de cupones de GoodRx.  - Si necesita que su receta se enve electrnicamente a una farmacia diferente, informe a nuestra oficina a travs de MyChart de [employer] o por telfono llamando al [phone] y presione la opcin 4.

## 2024-10-14 ENCOUNTER — Other Ambulatory Visit: Payer: Self-pay | Admitting: Internal Medicine

## 2024-10-14 DIAGNOSIS — I25810 Atherosclerosis of coronary artery bypass graft(s) without angina pectoris: Secondary | ICD-10-CM

## 2024-10-14 DIAGNOSIS — E782 Mixed hyperlipidemia: Secondary | ICD-10-CM

## 2024-10-17 ENCOUNTER — Ambulatory Visit: Admitting: Dermatology

## 2024-10-18 ENCOUNTER — Encounter: Payer: Self-pay | Admitting: Cardiology

## 2024-10-18 ENCOUNTER — Observation Stay
Admission: EM | Admit: 2024-10-18 | Disposition: A | Discharge status: Skilled Nursing Facility | Payer: Medicare (Managed Care) | Source: Home / Self Care

## 2024-10-18 ENCOUNTER — Encounter: Payer: Self-pay | Admitting: Emergency Medicine

## 2024-10-18 ENCOUNTER — Ambulatory Visit: Payer: Medicare (Managed Care) | Admitting: Cardiology

## 2024-10-18 ENCOUNTER — Emergency Department: Payer: Medicare (Managed Care)

## 2024-10-18 ENCOUNTER — Other Ambulatory Visit: Payer: Self-pay

## 2024-10-18 VITALS — BP 170/90 | HR 60 | Ht 69.0 in

## 2024-10-18 DIAGNOSIS — R2681 Unsteadiness on feet: Secondary | ICD-10-CM | POA: Diagnosis not present

## 2024-10-18 DIAGNOSIS — I160 Hypertensive urgency: Secondary | ICD-10-CM

## 2024-10-18 DIAGNOSIS — Z951 Presence of aortocoronary bypass graft: Secondary | ICD-10-CM

## 2024-10-18 DIAGNOSIS — R296 Repeated falls: Secondary | ICD-10-CM

## 2024-10-18 DIAGNOSIS — R29898 Other symptoms and signs involving the musculoskeletal system: Secondary | ICD-10-CM | POA: Diagnosis not present

## 2024-10-18 DIAGNOSIS — Y92009 Unspecified place in unspecified non-institutional (private) residence as the place of occurrence of the external cause: Secondary | ICD-10-CM | POA: Diagnosis not present

## 2024-10-18 DIAGNOSIS — J449 Chronic obstructive pulmonary disease, unspecified: Secondary | ICD-10-CM | POA: Diagnosis present

## 2024-10-18 DIAGNOSIS — M549 Dorsalgia, unspecified: Secondary | ICD-10-CM | POA: Diagnosis present

## 2024-10-18 DIAGNOSIS — W19XXXA Unspecified fall, initial encounter: Secondary | ICD-10-CM | POA: Diagnosis not present

## 2024-10-18 DIAGNOSIS — I2510 Atherosclerotic heart disease of native coronary artery without angina pectoris: Secondary | ICD-10-CM

## 2024-10-18 DIAGNOSIS — I10 Essential (primary) hypertension: Secondary | ICD-10-CM

## 2024-10-18 DIAGNOSIS — R2689 Other abnormalities of gait and mobility: Secondary | ICD-10-CM

## 2024-10-18 DIAGNOSIS — S32020A Wedge compression fracture of second lumbar vertebra, initial encounter for closed fracture: Secondary | ICD-10-CM

## 2024-10-18 DIAGNOSIS — S32029A Unspecified fracture of second lumbar vertebra, initial encounter for closed fracture: Secondary | ICD-10-CM

## 2024-10-18 DIAGNOSIS — N401 Enlarged prostate with lower urinary tract symptoms: Secondary | ICD-10-CM | POA: Diagnosis present

## 2024-10-18 LAB — COMPREHENSIVE METABOLIC PANEL WITH GFR
ALT: 15 U/L (ref 0–44)
AST: 34 U/L (ref 15–41)
Albumin: 4.3 g/dL (ref 3.5–5.0)
Alkaline Phosphatase: 180 U/L — ABNORMAL HIGH (ref 38–126)
Anion gap: 11 (ref 5–15)
BUN: 20 mg/dL (ref 8–23)
CO2: 24 mmol/L (ref 22–32)
Calcium: 9.8 mg/dL (ref 8.9–10.3)
Chloride: 107 mmol/L (ref 98–111)
Creatinine, Ser: 0.89 mg/dL (ref 0.61–1.24)
GFR, Estimated: >60 mL/min
Glucose, Bld: 128 mg/dL — ABNORMAL HIGH (ref 70–99)
Potassium: 3.7 mmol/L (ref 3.5–5.1)
Sodium: 142 mmol/L (ref 135–145)
Total Bilirubin: 0.9 mg/dL (ref 0.0–1.2)
Total Protein: 7.2 g/dL (ref 6.5–8.1)

## 2024-10-18 LAB — CBC
HCT: 39.5 % (ref 39.0–52.0)
Hemoglobin: 13.7 g/dL (ref 13.0–17.0)
MCH: 32.3 pg (ref 26.0–34.0)
MCHC: 34.7 g/dL (ref 30.0–36.0)
MCV: 93.2 fL (ref 80.0–100.0)
Platelets: 194 10*3/uL (ref 150–400)
RBC: 4.24 MIL/uL (ref 4.22–5.81)
RDW: 13 % (ref 11.5–15.5)
WBC: 6.9 10*3/uL (ref 4.0–10.5)
nRBC: 0 % (ref 0.0–0.2)

## 2024-10-18 LAB — TROPONIN T, HIGH SENSITIVITY
Troponin T High Sensitivity: 21 ng/L — ABNORMAL HIGH (ref 0–19)
Troponin T High Sensitivity: 19 ng/L (ref 0–19)

## 2024-10-18 LAB — CBG MONITORING, ED: Glucose-Capillary: 111 mg/dL — ABNORMAL HIGH (ref 70–99)

## 2024-10-18 LAB — CK: Total CK: 405 U/L — ABNORMAL HIGH (ref 49–397)

## 2024-10-18 MED ORDER — HYDRALAZINE HCL 20 MG/ML IJ SOLN
5.0000 mg | INTRAMUSCULAR | Status: AC | PRN
  Administered 2024-10-19 (×2): 5 mg via INTRAVENOUS
  Filled 2024-10-18 (×2): qty 1

## 2024-10-18 MED ORDER — OXYCODONE HCL 5 MG PO TABS
5.0000 mg | ORAL_TABLET | ORAL | Status: AC | PRN
  Administered 2024-10-19 – 2024-10-22 (×5): 5 mg via ORAL
  Filled 2024-10-18 (×6): qty 1

## 2024-10-18 MED ORDER — PANTOPRAZOLE SODIUM 40 MG PO TBEC
40.0000 mg | DELAYED_RELEASE_TABLET | Freq: Every day | ORAL | Status: AC
  Administered 2024-10-19 – 2024-10-27 (×9): 40 mg via ORAL
  Filled 2024-10-18 (×9): qty 1

## 2024-10-18 MED ORDER — OXYCODONE HCL 5 MG PO TABS
5.0000 mg | ORAL_TABLET | Freq: Once | ORAL | Status: AC
  Administered 2024-10-18: 5 mg via ORAL
  Filled 2024-10-18: qty 1

## 2024-10-18 MED ORDER — METHOCARBAMOL 1000 MG/10ML IJ SOLN: 500.0000 mg | Freq: Four times a day (QID) | INTRAMUSCULAR | Status: AC | PRN

## 2024-10-18 MED ORDER — CELECOXIB 200 MG PO CAPS
200.0000 mg | ORAL_CAPSULE | Freq: Two times a day (BID) | ORAL | Status: AC
  Administered 2024-10-19 – 2024-10-27 (×18): 200 mg via ORAL
  Filled 2024-10-18 (×18): qty 1

## 2024-10-18 MED ORDER — ACETAMINOPHEN 500 MG PO TABS
1000.0000 mg | ORAL_TABLET | Freq: Once | ORAL | Status: AC
  Administered 2024-10-18: 1000 mg via ORAL
  Filled 2024-10-18: qty 2

## 2024-10-18 MED ORDER — ENALAPRIL MALEATE 10 MG PO TABS
20.0000 mg | ORAL_TABLET | Freq: Once | ORAL | Status: AC
  Administered 2024-10-18: 20 mg via ORAL
  Filled 2024-10-18: qty 2

## 2024-10-18 MED ORDER — ONDANSETRON HCL 4 MG PO TABS: 4.0000 mg | ORAL_TABLET | Freq: Four times a day (QID) | ORAL | Status: AC | PRN

## 2024-10-18 MED ORDER — ONDANSETRON HCL 4 MG/2ML IJ SOLN
4.0000 mg | Freq: Four times a day (QID) | INTRAMUSCULAR | Status: AC | PRN
  Administered 2024-10-19: 4 mg via INTRAVENOUS
  Filled 2024-10-18: qty 2

## 2024-10-18 MED ORDER — ACETAMINOPHEN 325 MG PO TABS: 650.0000 mg | ORAL_TABLET | Freq: Four times a day (QID) | ORAL | Status: AC | PRN

## 2024-10-18 MED ORDER — ACETAMINOPHEN 650 MG RE SUPP: 650.0000 mg | Freq: Four times a day (QID) | RECTAL | Status: AC | PRN

## 2024-10-18 MED ORDER — IOHEXOL 300 MG/ML  SOLN
100.0000 mL | Freq: Once | INTRAMUSCULAR | Status: AC | PRN
  Administered 2024-10-18: 100 mL via INTRAVENOUS

## 2024-10-18 MED ORDER — ENALAPRIL MALEATE 20 MG PO TABS
20.0000 mg | ORAL_TABLET | Freq: Every day | ORAL | Status: AC
  Administered 2024-10-19 – 2024-10-27 (×9): 20 mg via ORAL
  Filled 2024-10-18: qty 2
  Filled 2024-10-18: qty 1
  Filled 2024-10-18 (×7): qty 2
  Filled 2024-10-18: qty 1
  Filled 2024-10-18: qty 2

## 2024-10-18 MED ORDER — MORPHINE SULFATE (PF) 2 MG/ML IV SOLN: 2.0000 mg | INTRAVENOUS | Status: DC | PRN

## 2024-10-18 MED ORDER — LIDOCAINE 5 % EX PTCH
1.0000 | MEDICATED_PATCH | CUTANEOUS | Status: AC
  Administered 2024-10-18 – 2024-10-26 (×9): 1 via TRANSDERMAL
  Filled 2024-10-18 (×9): qty 1

## 2024-10-18 MED ORDER — ROSUVASTATIN CALCIUM 10 MG PO TABS
10.0000 mg | ORAL_TABLET | Freq: Every day | ORAL | Status: AC
  Administered 2024-10-19 – 2024-10-27 (×9): 10 mg via ORAL
  Filled 2024-10-18 (×9): qty 1

## 2024-10-18 NOTE — Progress Notes (Addendum)
 "  Established Patient Office Visit  Subjective:  Patient ID: James [name], male    DOB: 12-Jun-1936  Age: 89 y.o. MRN: [id number]  Chief Complaint  Patient presents with   Leg Pain    Patient in office for an acute visit. Patient complaining of bilateral leg pain and weakness. Neighbor in room for visit. Patient has fallen four times in the last month, last night he fell, was on the floor for an hour and a half before he was found. Patient fell a few days prior, hitting his head on the wall, injuring back. Patient reports his knees are week, recently received a  left knee Monovisc injection on 08/22/24. Patient was doing well at visit on 10/02/24 with regular provider.  Patient comes in today, unable to bear weight due to bilateral knee pain, leg weakness. States when he hit head after a fall, he felt nauseous, had to refrain from vomiting.  Recommend going to the ED for urgent evaluation of head post fall, back and leg weakness.  Blood pressure elevated today, patient reports he did not take his medications today.     No other concerns at this time.   Past Medical History:  Diagnosis Date   Actinic keratosis    Anxiety    Aortic atherosclerosis    Aortic dilatation 01/10/2021   a.) CT 01/10/2021: infrarenal aortic dilitation measuring 3.3 x 3.3 cm. b.) CT 06/23/2021: measured 3.4 cm.   Arthritis    B12 deficiency    Basal cell carcinoma 03/14/2019   R nasal tip    Bilateral renal cysts    BPH (benign prostatic hyperplasia)    Bradycardia    Chronic kidney disease    COPD (chronic obstructive pulmonary disease) (HCC)    Coronary artery disease    a.) s/p 2v CABG in 2001; performed while living in [place] .   Diastolic dysfunction 06/2020   a.) TTE 06/2020: EF 60%; sev LA dilation, mild RA dilation; triv PR, mild to mod TR/MR/AR; G1DD.   Frequency of urination    GERD (gastroesophageal reflux disease)    Hiatal hernia    History of kidney stones    HLD  (hyperlipidemia)    Hypertension    Internal hemorrhoids    Long term current use of anticoagulant    a.) Rivaroxaban   Melanoma (HCC) 11/11/2022   right infra orbital - Liquid nitrogen destruction 11/17/22 Imiquimod  cream started 12/30/22 used for ~6wks, Clear 06/13/24   Mobitz type 1 second degree AV block    OSA (obstructive sleep apnea)    a.) does not require nocturnal PAP therapy   Osteopenia    Pulmonary HTN (HCC) 06/2020   a.) mild   S/P CABG x 2 2001   a.) details unknown; performed in 2001 in [place] .    Past Surgical History:  Procedure Laterality Date   CARDIAC CATHETERIZATION     COLONOSCOPY     CORONARY ARTERY BYPASS GRAFT     CYSTOSCOPY/URETEROSCOPY/HOLMIUM LASER/STENT PLACEMENT Right 11/07/2021   Procedure: CYSTOSCOPY/URETEROSCOPY/HOLMIUM LASER/STENT PLACEMENT;  Surgeon: Francisca Redell BROCKS, MD;  Location: [facility] ORS;  Service: Urology;  Laterality: Right;   FRACTURE SURGERY Left    fractured left arm   HOLEP-LASER ENUCLEATION OF THE PROSTATE WITH MORCELLATION N/A 11/07/2021   Procedure: HOLEP-LASER ENUCLEATION OF THE PROSTATE WITH MORCELLATION;  Surgeon: Francisca Redell BROCKS, MD;  Location: [facility] ORS;  Service: Urology;  Laterality: N/A;   LIPOMA EXCISION N/A 11/21/2018   Procedure: EXCISION BACK LIPOMA;  Surgeon: Curvin,  Deward MOULD, MD;  Location: [facility] OR;  Service: General;  Laterality: N/A;   PILONIDAL CYST EXCISION     SKIN CANCER EXCISION     TOTAL SHOULDER REPLACEMENT Left    UMBILICAL HERNIA REPAIR N/A 11/21/2018   Procedure: HERNIA REPAIR UMBILICAL ADULT WITH MESH;  Surgeon: Curvin Deward MOULD, MD;  Location: [facility] OR;  Service: General;  Laterality: N/A;    Social History   Socioeconomic History   Marital status: Widowed    Spouse name: Not on file   Number of children: Not on file   Years of education: Not on file   Highest education level: Not on file  Occupational History   Not on file  Tobacco Use   Smoking status: Former    Current packs/day: 0.00    Average  packs/day: 1 pack/day for 20.0 years (20.0 ttl pk-yrs)    Types: Cigarettes    Start date: 09/21/1993    Quit date: 09/21/2013    Years since quitting: 11.0    Passive exposure: Past   Smokeless tobacco: Never   Tobacco comments:    quit on and off over the years  Vaping Use   Vaping status: Never Used  Substance and Sexual Activity   Alcohol use: Yes    Comment: socially   Drug use: No   Sexual activity: Yes    Birth control/protection: None  Other Topics Concern   Not on file  Social History Narrative   Not on file   Social Drivers of Health   Tobacco Use: Medium Risk (10/18/2024)   Patient History    Smoking Tobacco Use: Former    Smokeless Tobacco Use: Never    Passive Exposure: Past  Physicist, Medical Strain: Low Risk  (08/22/2024)   Received from [facility]   Overall Financial Resource Strain (CARDIA)    Difficulty of Paying Living Expenses: Not very hard  Food Insecurity: No Food Insecurity (08/22/2024)   Received from [facility]   Epic    Within the past 12 months, you worried that your food would run out before you got the money to buy more.: Never true    Within the past 12 months, the food you bought just didn't last and you didn't have money to get more.: Never true  Recent Concern: Food Insecurity - Food Insecurity Present (06/20/2024)   Received from [facility]   Epic    Within the past 12 months, you worried that your food would run out before you got the money to buy more.: Sometimes true    Within the past 12 months, the food you bought just didn't last and you didn't have money to get more.: Sometimes true  Transportation Needs: No Transportation Needs (08/22/2024)   Received from [facility] - Transportation    In the past 12 months, has lack of transportation kept you from medical appointments or from getting medications?: No    Lack of Transportation (Non-Medical): No   Physical Activity: Sufficiently Active (08/16/2023)   Exercise Vital Sign    Days of Exercise per Week: 7 days    Minutes of Exercise per Session: 60 min  Stress: Not on file  Social Connections: Not on file  Intimate Partner Violence: Not on file  Depression (PHQ2-9): Medium Risk (08/16/2023)   Depression (PHQ2-9)    PHQ-2 Score: 9  Alcohol Screen: Low Risk (08/16/2023)   Alcohol Screen    Last Alcohol Screening Score (  AUDIT): 3  Housing: Low Risk  (08/22/2024)   Received from [facility]   Epic    In the last 12 months, was there a time when you were not able to pay the mortgage or rent on time?: No    In the past 12 months, how many times have you moved where you were living?: 0    At any time in the past 12 months, were you homeless or living in a shelter (including now)?: No  Recent Concern: Housing - High Risk (07/21/2024)   Received from [facility]   Epic    In the last 12 months, was there a time when you were not able to pay the mortgage or rent on time?: Yes    In the past 12 months, how many times have you moved where you were living?: 0    At any time in the past 12 months, were you homeless or living in a shelter (including now)?: No  Utilities: Not At Risk (08/22/2024)   Received from [facility]   Epic    In the past 12 months has the electric, gas, oil, or water company threatened to shut off services in your home?: No  Health Literacy: Adequate Health Literacy (08/16/2023)   B1300 Health Literacy    Frequency of need for help with medical instructions: Never    Family History  Problem Relation Age of Onset   Prostate cancer Neg Hx    Bladder Cancer Neg Hx    Kidney cancer Neg Hx     Allergies[1]  Show/hide medication list[2]  Review of Systems  Constitutional: Negative.   HENT: Negative.    Eyes: Negative.   Respiratory: Negative.  Negative for shortness of breath.   Cardiovascular: Negative.   Negative for chest pain.  Gastrointestinal: Negative.  Negative for abdominal pain, constipation and diarrhea.  Genitourinary: Negative.   Musculoskeletal:  Positive for back pain and joint pain. Negative for myalgias.       Bilateral LE weakness  Skin: Negative.   Neurological: Negative.  Negative for dizziness and headaches.  Endo/Heme/Allergies: Negative.   All other systems reviewed and are negative.      Objective:   BP (!) 170/90   Pulse 60   Ht 5' 9 (1.753 m)   SpO2 98%   BMI 26.73 kg/m   Vitals:   10/18/24 1106  BP: (!) 170/90  Pulse: 60  Height: 5' 9 (1.753 m)  Weight: Comment: unable to weigh  SpO2: 98%    Physical Exam Nursing note reviewed.  Constitutional:      Appearance: Normal appearance. He is normal weight.  HENT:     Head: Normocephalic and atraumatic.     Nose: Nose normal.     Mouth/Throat:     Mouth: Mucous membranes are moist.     Pharynx: Oropharynx is clear.  Eyes:     Extraocular Movements: Extraocular movements intact.     Conjunctiva/sclera: Conjunctivae normal.     Pupils: Pupils are equal, round, and reactive to light.  Cardiovascular:     Rate and Rhythm: Normal rate and regular rhythm.     Pulses: Normal pulses.     Heart sounds: Normal heart sounds.  Pulmonary:     Effort: Pulmonary effort is normal.     Breath sounds: Normal breath sounds.  Abdominal:     General: Abdomen is flat. Bowel sounds are normal.     Palpations: Abdomen is soft.  Musculoskeletal:        General: Normal range of motion.     Cervical back: Normal range of motion.     Right lower leg: No swelling. No edema.     Left lower leg: No swelling. No edema.  Skin:    General: Skin is warm and dry.  Neurological:     General: No focal deficit present.     Mental Status: He is alert and oriented to person, place, and time.  Psychiatric:        Mood and Affect: Mood normal.        Behavior: Behavior normal.        Thought Content: Thought content  normal.        Judgment: Judgment normal.      Results for orders placed or performed during the hospital encounter of 10/18/24  CBC  Result Value Ref Range   WBC 6.9 4.0 - 10.5 K/uL   RBC 4.24 4.22 - 5.81 MIL/uL   Hemoglobin 13.7 13.0 - 17.0 g/dL   HCT 60.4 60.9 - 47.9 %   MCV 93.2 80.0 - 100.0 fL   MCH 32.3 26.0 - 34.0 pg   MCHC 34.7 30.0 - 36.0 g/dL   RDW 86.9 88.4 - 84.4 %   Platelets 194 150 - 400 K/uL   nRBC 0.0 0.0 - 0.2 %    Recent Results (from the past 2160 hours)  CBC     Status: None   Collection Time: 10/18/24 12:08 PM  Result Value Ref Range   WBC 6.9 4.0 - 10.5 K/uL   RBC 4.24 4.22 - 5.81 MIL/uL   Hemoglobin 13.7 13.0 - 17.0 g/dL   HCT 60.4 60.9 - 47.9 %   MCV 93.2 80.0 - 100.0 fL   MCH 32.3 26.0 - 34.0 pg   MCHC 34.7 30.0 - 36.0 g/dL   RDW 86.9 88.4 - 84.4 %   Platelets 194 150 - 400 K/uL   nRBC 0.0 0.0 - 0.2 %    Comment: Performed at [facility], [address], [place], [place] [address]      Assessment & Plan:  Go to ED for urgent evaluation after frequent falls.   Problem List Items Addressed This Visit       Cardiovascular and Mediastinum   Essential hypertension, benign     Other   Unsteady gait when walking   Weakness of both lower extremities   Fall at home, initial encounter - Primary    Return if symptoms worsen or fail to improve, for as scheduled with NK.   Total time spent: 25 minutes. This time includes review of previous notes and results and patient face to face interaction during today's visit.    Jeoffrey Pollen, NP  10/18/2024   This document may have been prepared by [facility] Voice Recognition software and as such may include unintentional dictation errors.      [1] No Known Allergies [2]  Outpatient Medications Prior to Visit  Medication Sig   celecoxib  (CELEBREX ) 200 MG capsule Take 1 capsule (200 mg total) by mouth 2 (two) times daily. (Patient taking differently: Take 200 mg by mouth daily.)    citalopram  (CELEXA ) 40 MG tablet Take 40 mg by mouth daily.   enalapril  (VASOTEC ) 20 MG tablet Take 1 tablet (20 mg total) by mouth daily.   pantoprazole  (PROTONIX ) 40 MG tablet TAKE 1 TABLET BY MOUTH EVERY DAY   rosuvastatin  (CRESTOR ) 10 MG tablet TAKE 1 TABLET BY MOUTH  EVERY DAY   vitamin B-12 (CYANOCOBALAMIN) 500 MCG tablet  (Patient not taking: Reported on 10/18/2024)   No facility-administered medications prior to visit.   "

## 2024-10-18 NOTE — Assessment & Plan Note (Signed)
 Likely secondary to pain Continue home Vasotec  Hydralazine  as needed for additional control

## 2024-10-18 NOTE — Assessment & Plan Note (Signed)
 Chronic urinary incontinence-no acute worsening Resume meds pending med rec

## 2024-10-18 NOTE — ED Provider Notes (Signed)
" °  Physical Exam  BP (!) 202/103   Pulse 83   Temp 97.9 F (36.6 C) (Oral)   Resp 18   Ht 5' 10 (1.778 m)   Wt 80.7 kg   SpO2 100%   BMI 25.54 kg/m   Physical Exam  Procedures  Procedures  ED Course / MDM   Clinical Course as of 10/18/24 1934  Wed Oct 18, 2024  1504 S/o from Dr. Hobert - 12M hx chronic balance issues - increasing weakness in L leg over past couple weeks - multiple falls due to this - did hit head in some of these falls, also w/ back and leg pain - no focal weakness on exam - chronic (months to years) numbness, urinary incontinence - CTH neg  TO DO: - broad imaging pending - re-eval after [MM]  1740 CT L spine: IMPRESSION: 1. Acute compression fracture of the inferior endplate of L2 with approximately 30% loss of vertebral body height and mild anterior wedging. No retropulsion. 2. No acute/traumatic thoracic or lumbar spine pathology. 3. Multilevel degenerative changes of the thoracic and lumbar spine.   [MM]  1744 CTCAP: IMPRESSION: 1. Acute compression fracture of the inferior endplate of L2 with approximately 30% loss of vertebral body height and anterior wedging. No retropulsion. 2. No other acute/traumatic intrathoracic, abdominal, or pelvic pathology. 3. Indeterminate mixed density exophytic lesion from the lateral interpolar right kidney. Ultrasound or MRI may provide better evaluation on a nonemergent/outpatient basis. 4. Nonobstructing bilateral renal calculi. No hydronephrosis. 5. Colonic diverticulosis. No bowel obstruction. Normal appendix. 6. A 3.5 cm infrarenal abdominal aortic aneurysm. Recommend follow-up ultrasound every 3 years. (Ref.: J Vasc Surg. 2018; 67:2-77 and J Am Coll Radiol 2013;10(10):789-794.) 7. Aortic Atherosclerosis (ICD10-I70.0) and Emphysema (ICD10-J43.9). Given the presence of pulmonary emphysema, an independent risk factor for lung cancer, consider evaluating the patient for a low-dose CT lung cancer  screening program.   [MM]  1906 Pt re-evaluated, confirms 3 falls   Confirms notable pain when attempting to walk Denies weakness, says difficulty walking is due to pain in back Left bilateral lower extremities antigravity for me able to Confirms chronic urinary incontinence Amenable to escalation to narcotics Amenable to straight cath, bladder scan 170s Amenable to admission  No retropulsion on CT.  Overall reassuring exam.  Do not suspect spinal cord pathology at this time.  Hospitalist consult order placed  [MM]    Clinical Course User Index [MM] Clarine Ozell LABOR, MD   Medical Decision Making Amount and/or Complexity of Data Reviewed Labs: ordered. Radiology: ordered.  Risk OTC drugs. Prescription drug management. Decision regarding hospitalization.          Clarine Ozell LABOR, MD 10/18/24 1934  "

## 2024-10-18 NOTE — ED Triage Notes (Signed)
 Pt via POV from home. Pt c/o generalized weakness for the past couple of days, pt has been having recurrent fall. Pt had a fall yesterday. + head injury. Denies blood thinners. Denies any LOC. States he is having back pain that is worse now after his fall. Pt is A&Ox4 and NAD.

## 2024-10-18 NOTE — ED Provider Notes (Signed)
 "  [facility] Provider Note    Event Date/Time   First MD Initiated Contact with Patient 10/18/24 1324     (approximate)   History   Fall and Weakness   HPI  James [name] is a 89 y.o. male who comes in with concerns for a fall.  I reviewed a note where patient was seen by internal medicine today he was unable to bear weight due to pain in bilateral legs and leg weakness and that he had hit his head after the fall therefore patient was sent over here for evaluation.  Patient reports difficulties with balance.  He reports that this has been an ongoing issue but he noted over the past few weeks some increasing weakness of the left leg.  He also reports that yesterday when he had the fall but he felt his leg gave out.  He reports hurting both of the legs and now having pain in his bilateral hips and knees.  He does report history of some issues with his memory but denies any chest pain, shortness of breath prior to the fall no known urinary symptoms.  States that he is not been able to ambulate since the fall secondary to pain.  He also reports some pain in his low back has been ongoing for a few weeks as well.  At baseline he reports over the past year he has had issues with urinary incontinence.  Patient does report that he struck his head and that he had a little bit of nausea yesterday which is what prompted him to want to come in today to be evaluated.  Physical Exam   Triage Vital Signs: ED Triage Vitals  Encounter Vitals Group     BP 10/18/24 1206 (!) 176/106     Girls Systolic BP Percentile --      Girls Diastolic BP Percentile --      Boys Systolic BP Percentile --      Boys Diastolic BP Percentile --      Pulse Rate 10/18/24 1206 74     Resp 10/18/24 1206 18     Temp --      Temp src --      SpO2 10/18/24 1206 98 %     Weight 10/18/24 1204 178 lb (80.7 kg)     Height 10/18/24 1204 5' 10 (1.778 m)     Head Circumference --      Peak Flow --       Pain Score 10/18/24 1204 7     Pain Loc --      Pain Education --      Exclude from Growth Chart --     Most recent vital signs: Vitals:   10/18/24 1206  BP: (!) 176/106  Pulse: 74  Resp: 18  SpO2: 98%     General: Awake, no distress.  CV:  Good peripheral perfusion.  Resp:  Normal effort.  Abd:  No distention.  Soft and nontender Other:  L-spine tenderness.  Tenderness on the bilateral hips and bilateral knees and a little on the right ankle.  Good distal pulses.  Able to flex and extend at the knee but does report pain of his bilateral hips.  No obvious chest wall tenderness.   ED Results / Procedures / Treatments   Labs (all labs ordered are listed, but only abnormal results are displayed) Labs Reviewed  COMPREHENSIVE METABOLIC PANEL WITH GFR - Abnormal; Notable for the following components:  Result Value   Glucose, Bld 128 (*)    Alkaline Phosphatase 180 (*)    All other components within normal limits  CBC  URINALYSIS, ROUTINE W REFLEX MICROSCOPIC  CBG MONITORING, ED     EKG  My interpretation of EKG:  Sinus rate of 80 without any ST elevation or T wave inversions type I AV block.  RADIOLOGY I have reviewed the ct personally and interpreted no evidence of intracranial hemorrhage   PROCEDURES:  Critical Care performed: No  Procedures   MEDICATIONS ORDERED IN ED: Medications  lidocaine  (LIDODERM ) 5 % 1 patch (has no administration in time range)  acetaminophen  (TYLENOL ) tablet 1,000 mg (has no administration in time range)  enalapril  (VASOTEC ) tablet 20 mg (has no administration in time range)     IMPRESSION / MDM / ASSESSMENT AND PLAN / ED COURSE  I reviewed the triage vital signs and the nursing notes.   Patient's presentation is most consistent with acute presentation with potential threat to life or bodily function.   Patient comes in with a fall yesterday on 10/17/2024.  He does report some increasing weakness in his left leg although  on examination I do not see any obvious weakness on one side.  His cranial nerves appear overall reassuring.  Will get x-rays evaluate for any fractures and CT imaging to evaluate for any lumbar fracture, pelvic fracture, thoracic fracture given his back pain.  Given his increasing weakness will get CT imaging of chest abdomen pelvis to ensure no masses, bleeding or any other acute pathology.  I considered the possibility of cord compression although he reports his urinary incontinence has been an ongoing issue for over a year and on examination he does not only seem to have any focal weakness but would consider further workup for this depending upon CT imaging.  CT imaging of the head was done from triage that was negative for intracranial hemorrhage.  Patient will be given some Tylenol , lidocaine  patches to help with pain and then we will attempt to ambulate patient.  He is hypertensive here but given his home dose of lisinopril.  It is possible that this is all just related to his chronic issues with balance but will get workup to ensure no acute pathology.  CBC was reassuring.  CMP was reassuring slightly elevated alk phos.  Patient will be handed off to oncoming team pending CT imaging, x-ray and reevaluation of ambulation  The patient is on the cardiac monitor to evaluate for evidence of arrhythmia and/or significant heart rate changes.      FINAL CLINICAL IMPRESSION(S) / ED DIAGNOSES   Final diagnoses:  Fall, initial encounter     Rx / DC Orders   ED Discharge Orders     None        Note:  This document was prepared using Dragon voice recognition software and may include unintentional dictation errors.   Ernest Ronal BRAVO, MD 10/18/24 1404  "

## 2024-10-18 NOTE — Assessment & Plan Note (Signed)
 Acute L2 vertebral fracture Accidental fall/ / history of frequent falls / chronic gait imbalance Multimodal pain control PT OT consult IR consult placed for consideration of kyphoplasty Keeping n.p.o. in case of procedure Can consider neurosurgical consult

## 2024-10-18 NOTE — Assessment & Plan Note (Signed)
 History of CABG x 2 No complaints of chest pain, EKG nonacute, troponin negative Continue enalapril  and rosuvastatin 

## 2024-10-18 NOTE — H&P (Signed)
 " History and Physical    Patient: James [name] FMW:[id number] DOB: [birth year] DOA: 10/18/2024 DOS: the patient was seen and examined on 10/18/2024 PCP: [name], MD  Patient coming from: Home  Chief Complaint:  Chief Complaint  Patient presents with   Fall   Weakness    HPI: James [name] is a 89 y.o. male with medical history significant for CAD s/p CABG, COPD, CKD, HTN, ambulatory dysfunction, being admitted with an acute L2 fracture following a fall the day prior.  He presented with back pain and hip pain with inability to ambulate following the fall.  He has fallen at least twice in the past 3 days.  He did have head strike on 1 occasion, followed by nausea but did not lose consciousness.   He has had no worsening in his chronic urinary incontinence and he denies saddle anesthesia.  He also denies preceding chest pain, shortness of breath, visual disturbance, one-sided weakness numbness or tingling prior to the falls.  States his legs just give out.  Hitting his head was what prompted the visit to the ED. Of note, patient has had prior neurologic evaluation of complaints of tremors/shuffling gait/falls/cognitive dysfunction as far back as October 2024, when he was prescribed PT and exercises to improve balance.  He has had extensive prior imaging including MRI brain with and without contrast which previously showed no acute abnormality.. In the ED BP 202/103 with otherwise normal vitals Labs notable for CK 405, alk phos elevation to 180 but otherwise normal CMP and CBC and with negative troponin. EKG showed sinus at 80 with first-degree AV block  Patient had extensive trauma imaging notable for acute L2 fracture without retropulsion.  Imaging included CT C, T, L-spine, CT chest abdomen and pelvis with contrast and x-rays of the left and right knees and ankles)  Patient was treated with Tylenol  and Lidoderm  patch and given a dose of oxycodone   Admission requested for pain  control, PT and consideration of neurosurgery/kyphoplasty     Past Medical History:  Diagnosis Date   Actinic keratosis    Anxiety    Aortic atherosclerosis    Aortic dilatation 01/10/2021   a.) CT 01/10/2021: infrarenal aortic dilitation measuring 3.3 x 3.3 cm. b.) CT 06/23/2021: measured 3.4 cm.   Arthritis    B12 deficiency    Basal cell carcinoma 03/14/2019   R nasal tip    Bilateral renal cysts    BPH (benign prostatic hyperplasia)    Bradycardia    Chronic kidney disease    COPD (chronic obstructive pulmonary disease) (HCC)    Coronary artery disease    a.) s/p 2v CABG in 2001; performed while living in [place] .   Diastolic dysfunction 06/2020   a.) TTE 06/2020: EF 60%; sev LA dilation, mild RA dilation; triv PR, mild to mod TR/MR/AR; G1DD.   Frequency of urination    GERD (gastroesophageal reflux disease)    Hiatal hernia    History of kidney stones    HLD (hyperlipidemia)    Hypertension    Internal hemorrhoids    Long term current use of anticoagulant    a.) Rivaroxaban   Melanoma (HCC) 11/11/2022   right infra orbital - Liquid nitrogen destruction 11/17/22 Imiquimod  cream started 12/30/22 used for ~6wks, Clear 06/13/24   Mobitz type 1 second degree AV block    OSA (obstructive sleep apnea)    a.) does not require nocturnal PAP therapy   Osteopenia    Pulmonary HTN (HCC)  06/2020   a.) mild   S/P CABG x 2 2001   a.) details unknown; performed in 2001 in [place] .   Past Surgical History:  Procedure Laterality Date   CARDIAC CATHETERIZATION     COLONOSCOPY     CORONARY ARTERY BYPASS GRAFT     CYSTOSCOPY/URETEROSCOPY/HOLMIUM LASER/STENT PLACEMENT Right 11/07/2021   Procedure: CYSTOSCOPY/URETEROSCOPY/HOLMIUM LASER/STENT PLACEMENT;  Surgeon: Francisca Redell BROCKS, MD;  Location: [facility] ORS;  Service: Urology;  Laterality: Right;   FRACTURE SURGERY Left    fractured left arm   HOLEP-LASER ENUCLEATION OF THE PROSTATE WITH MORCELLATION N/A 11/07/2021    Procedure: HOLEP-LASER ENUCLEATION OF THE PROSTATE WITH MORCELLATION;  Surgeon: Francisca Redell BROCKS, MD;  Location: [facility] ORS;  Service: Urology;  Laterality: N/A;   LIPOMA EXCISION N/A 11/21/2018   Procedure: EXCISION BACK LIPOMA;  Surgeon: Curvin Deward MOULD, MD;  Location: [facility] OR;  Service: General;  Laterality: N/A;   PILONIDAL CYST EXCISION     SKIN CANCER EXCISION     TOTAL SHOULDER REPLACEMENT Left    UMBILICAL HERNIA REPAIR N/A 11/21/2018   Procedure: HERNIA REPAIR UMBILICAL ADULT WITH MESH;  Surgeon: Curvin Deward MOULD, MD;  Location: [facility] OR;  Service: General;  Laterality: N/A;   Social History:  reports that he quit smoking about 11 years ago. His smoking use included cigarettes. He started smoking about 31 years ago. He has a 20 pack-year smoking history. He has been exposed to tobacco smoke. He has never used smokeless tobacco. He reports current alcohol use. He reports that he does not use drugs.  Allergies[1]  Family History  Problem Relation Age of Onset   Prostate cancer Neg Hx    Bladder Cancer Neg Hx    Kidney cancer Neg Hx     Prior to Admission medications  Medication Sig Start Date End Date Taking? Authorizing Provider  celecoxib  (CELEBREX ) 200 MG capsule Take 1 capsule (200 mg total) by mouth 2 (two) times daily. Patient taking differently: Take 200 mg by mouth daily. 06/30/24  Yes [name], MD  citalopram  (CELEXA ) 40 MG tablet Take 40 mg by mouth daily.   Yes [provider]  enalapril  (VASOTEC ) 20 MG tablet Take 1 tablet (20 mg total) by mouth daily. 08/24/24  Yes [name], MD  pantoprazole  (PROTONIX ) 40 MG tablet TAKE 1 TABLET BY MOUTH EVERY DAY 07/17/24  Yes [name], MD  rosuvastatin  (CRESTOR ) 10 MG tablet TAKE 1 TABLET BY MOUTH EVERY DAY 10/14/24  Yes [name], MD  vitamin B-12 (CYANOCOBALAMIN) 500 MCG tablet  02/12/22   [provider]    Physical Exam: Vitals:   10/18/24 1405 10/18/24 1600 10/18/24 1700 10/18/24 2118  BP:   (!)  202/103 (!) 168/103  Pulse:  83  (!) 55  Resp:    19  Temp: 97.9 F (36.6 C)   97.7 F (36.5 C)  TempSrc: Oral     SpO2:  100%  96%  Weight:      Height:       Physical Exam Vitals and nursing note reviewed.  Constitutional:      General: He is not in acute distress. HENT:     Head: Normocephalic and atraumatic.  Cardiovascular:     Rate and Rhythm: Normal rate and regular rhythm.     Heart sounds: Normal heart sounds.  Pulmonary:     Effort: Pulmonary effort is normal.     Breath sounds: Normal breath sounds.  Abdominal:  Palpations: Abdomen is soft.     Tenderness: There is no abdominal tenderness.  Neurological:     Mental Status: Mental status is at baseline.     Labs on Admission: I have personally reviewed following labs and imaging studies  CBC: Recent Labs  Lab 10/18/24 1208  WBC 6.9  HGB 13.7  HCT 39.5  MCV 93.2  PLT 194   Basic Metabolic Panel: Recent Labs  Lab 10/18/24 1208  NA 142  K 3.7  CL 107  CO2 24  GLUCOSE 128*  BUN 20  CREATININE 0.89  CALCIUM  9.8   GFR: Estimated Creatinine Clearance: 61.5 mL/min (by C-G formula based on SCr of 0.89 mg/dL). Liver Function Tests: Recent Labs  Lab 10/18/24 1208  AST 34  ALT 15  ALKPHOS 180*  BILITOT 0.9  PROT 7.2  ALBUMIN 4.3   No results for input(s): LIPASE, AMYLASE in the last 168 hours. No results for input(s): AMMONIA in the last 168 hours. Coagulation Profile: No results for input(s): INR, PROTIME in the last 168 hours. Cardiac Enzymes: Recent Labs  Lab 10/18/24 1208  CKTOTAL 405*   BNP (last 3 results) No results for input(s): PROBNP in the last 8760 hours. HbA1C: No results for input(s): HGBA1C in the last 72 hours. CBG: Recent Labs  Lab 10/18/24 1342  GLUCAP 111*   Lipid Profile: No results for input(s): CHOL, HDL, LDLCALC, TRIG, CHOLHDL, LDLDIRECT in the last 72 hours. Thyroid Function Tests: No results for input(s): TSH, T4TOTAL,  FREET4, T3FREE, THYROIDAB in the last 72 hours. Anemia Panel: No results for input(s): VITAMINB12, FOLATE, FERRITIN, TIBC, IRON, RETICCTPCT in the last 72 hours. Urine analysis:    Component Value Date/Time   COLORURINE RED (A) 06/23/2021 1438   APPEARANCEUR Cloudy (A) 11/19/2021 1604   LABSPEC 1.021 06/23/2021 1438   PHURINE  06/23/2021 1438    TEST NOT REPORTED DUE TO COLOR INTERFERENCE OF URINE PIGMENT   GLUCOSEU Negative 11/19/2021 1604   HGBUR (A) 06/23/2021 1438    TEST NOT REPORTED DUE TO COLOR INTERFERENCE OF URINE PIGMENT   BILIRUBINUR Negative 11/19/2021 1604   KETONESUR (A) 06/23/2021 1438    TEST NOT REPORTED DUE TO COLOR INTERFERENCE OF URINE PIGMENT   PROTEINUR 2+ (A) 11/19/2021 1604   PROTEINUR (A) 06/23/2021 1438    TEST NOT REPORTED DUE TO COLOR INTERFERENCE OF URINE PIGMENT   NITRITE Negative 11/19/2021 1604   NITRITE (A) 06/23/2021 1438    TEST NOT REPORTED DUE TO COLOR INTERFERENCE OF URINE PIGMENT   LEUKOCYTESUR 1+ (A) 11/19/2021 1604   LEUKOCYTESUR (A) 06/23/2021 1438    TEST NOT REPORTED DUE TO COLOR INTERFERENCE OF URINE PIGMENT    Radiological Exams on Admission: CT CHEST ABDOMEN PELVIS W CONTRAST Result Date: 10/18/2024 CLINICAL DATA:  Blunt trauma. EXAM: CT CHEST, ABDOMEN, AND PELVIS WITH CONTRAST TECHNIQUE: Multidetector CT imaging of the chest, abdomen and pelvis was performed following the standard protocol during bolus administration of intravenous contrast. RADIATION DOSE REDUCTION: This exam was performed according to the departmental dose-optimization program which includes automated exposure control, adjustment of the mA and/or kV according to patient size and/or use of iterative reconstruction technique. CONTRAST:  OMNIPAQUE  IOHEXOL  300 MG/ML  SOLN COMPARISON:  CT abdomen pelvis dated 06/23/2021. FINDINGS: CT CHEST FINDINGS Cardiovascular: Top-normal cardiac size. No pericardial effusion. Advanced 3 vessel coronary vascular  calcification and postsurgical changes of CABG. There is advanced calcified and noncalcified plaque of the thoracic aorta. No aneurysmal dilatation or dissection. No periaortic fluid  collection. Similar appearance of a nodular density along the posterior descending thoracic aorta as seen on the CT of 2022, likely a lymph node. The central pulmonary arteries appear patent. Mediastinum/Nodes: No hilar or mediastinal adenopathy. The esophagus is grossly unremarkable no mediastinal fluid collection. Lungs/Pleura: Background of emphysema. No focal consolidation, pleural effusion, pneumothorax. Bilateral upper lobe subpleural blebs. The central airways are patent. Musculoskeletal: Median sternotomy wires. There is osteopenia with degenerative changes of spine. Acute compression fracture of inferior endplate of L2 with approximately 30% loss of vertebral body height and anterior wedging. No retropulsion. Multiple old healed bilateral rib fractures. There is a total left shoulder arthroplasty. CT ABDOMEN PELVIS FINDINGS No intra-abdominal free air or free fluid. Hepatobiliary: The liver is unremarkable. No biliary dilatation. The gallbladder is unremarkable. Pancreas: Unremarkable. No pancreatic ductal dilatation or surrounding inflammatory changes. Spleen: Normal in size without focal abnormality. Adrenals/Urinary Tract: The adrenal glands unremarkable. Multiple bilateral renal cysts. Indeterminate mixed density exophytic lesion from the lateral interpolar right kidney measures approximately 1.5 x 2 cm. This appears to have been present on the prior CT of 2022 and the higher attenuating component likely represent proteinaceous debris. However, a solid component is not excluded. Ultrasound or MRI may provide better evaluation on a nonemergent/outpatient basis. Several nonobstructing bilateral renal calculi measure up to 9 mm in the interpolar left kidney. There is no hydronephrosis on either side. The visualized ureters and  urinary bladder appear unremarkable. Stomach/Bowel: Several scattered colonic diverticula. There is no bowel obstruction or active inflammation. The appendix is normal. Vascular/Lymphatic: Advanced aortoiliac atherosclerotic disease. Diffusely dilated infrarenal abdominal aorta measure up to 3.5 cm. 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 Dilated common iliac arteries bilaterally measure up to 2 cm on the right. The IVC is unremarkable. No portal venous gas. There is no adenopathy. Reproductive: The prostate and seminal vesicles are grossly unremarkable. Other: None Musculoskeletal: Osteopenia with degenerative changes of the spine. No acute osseous pathology. IMPRESSION: 1. Acute compression fracture of the inferior endplate of L2 with approximately 30% loss of vertebral body height and anterior wedging. No retropulsion. 2. No other acute/traumatic intrathoracic, abdominal, or pelvic pathology. 3. Indeterminate mixed density exophytic lesion from the lateral interpolar right kidney. Ultrasound or MRI may provide better evaluation on a nonemergent/outpatient basis. 4. Nonobstructing bilateral renal calculi. No hydronephrosis. 5. Colonic diverticulosis. No bowel obstruction. Normal appendix. 6. A 3.5 cm infrarenal abdominal aortic aneurysm. Recommend follow-up ultrasound every 3 years. (Ref.: J Vasc Surg. 2018; 67:2-77 and J Am Coll Radiol 2013;10(10):789-794.) 7. Aortic Atherosclerosis (ICD10-I70.0) and Emphysema (ICD10-J43.9). Given the presence of pulmonary emphysema, an independent risk factor for lung cancer, consider evaluating the patient for a low-dose CT lung cancer screening program. Electronically Signed   By: Vanetta Chou M.D.   On: 10/18/2024 17:39   CT L-SPINE NO CHARGE Result Date: 10/18/2024 CLINICAL DATA:  Trauma. EXAM: CT THORACIC AND LUMBAR SPINE WITHOUT CONTRAST TECHNIQUE: Multidetector CT  imaging of the thoracic and lumbar spine was performed without contrast. Multiplanar CT image reconstructions were also generated. RADIATION DOSE REDUCTION: This exam was performed according to the departmental dose-optimization program which includes automated exposure control, adjustment of the mA and/or kV according to patient size and/or use of iterative reconstruction technique. COMPARISON:  CT abdomen pelvis dated 01/10/2021. FINDINGS: CT THORACIC SPINE FINDINGS Alignment: No acute subluxation. Vertebrae: There is acute compression fracture of the inferior endplate of L2 with  approximately 30% loss of vertebral body height and mild anterior wedging. No retropulsion. No other acute fracture. Evaluation for fracture however is very limited due to advanced osteopenia. Paraspinal and other soft tissues: No perispinal fluid collection. Disc levels: No acute findings. Multilevel degenerative changes with disc space narrowing and endplate irregularity and spurring. Multilevel osteophyte. CT LUMBAR SPINE FINDINGS Segmentation: 5 lumbar type vertebrae. Alignment: No acute subluxation. Vertebrae: No acute fracture.  Osteopenia. Paraspinal and other soft tissues: No paraspinal fluid collection or hematoma. Disc levels: No acute findings.  Multilevel degenerative changes. IMPRESSION: 1. Acute compression fracture of the inferior endplate of L2 with approximately 30% loss of vertebral body height and mild anterior wedging. No retropulsion. 2. No acute/traumatic thoracic or lumbar spine pathology. 3. Multilevel degenerative changes of the thoracic and lumbar spine. Electronically Signed   By: Vanetta Chou M.D.   On: 10/18/2024 17:29   CT T-SPINE NO CHARGE Result Date: 10/18/2024 CLINICAL DATA:  Trauma. EXAM: CT THORACIC AND LUMBAR SPINE WITHOUT CONTRAST TECHNIQUE: Multidetector CT imaging of the thoracic and lumbar spine was performed without contrast. Multiplanar CT image reconstructions were also generated.  RADIATION DOSE REDUCTION: This exam was performed according to the departmental dose-optimization program which includes automated exposure control, adjustment of the mA and/or kV according to patient size and/or use of iterative reconstruction technique. COMPARISON:  CT abdomen pelvis dated 01/10/2021. FINDINGS: CT THORACIC SPINE FINDINGS Alignment: No acute subluxation. Vertebrae: There is acute compression fracture of the inferior endplate of L2 with approximately 30% loss of vertebral body height and mild anterior wedging. No retropulsion. No other acute fracture. Evaluation for fracture however is very limited due to advanced osteopenia. Paraspinal and other soft tissues: No perispinal fluid collection. Disc levels: No acute findings. Multilevel degenerative changes with disc space narrowing and endplate irregularity and spurring. Multilevel osteophyte. CT LUMBAR SPINE FINDINGS Segmentation: 5 lumbar type vertebrae. Alignment: No acute subluxation. Vertebrae: No acute fracture.  Osteopenia. Paraspinal and other soft tissues: No paraspinal fluid collection or hematoma. Disc levels: No acute findings.  Multilevel degenerative changes. IMPRESSION: 1. Acute compression fracture of the inferior endplate of L2 with approximately 30% loss of vertebral body height and mild anterior wedging. No retropulsion. 2. No acute/traumatic thoracic or lumbar spine pathology. 3. Multilevel degenerative changes of the thoracic and lumbar spine. Electronically Signed   By: Vanetta Chou M.D.   On: 10/18/2024 17:29   CT Cervical Spine Wo Contrast Result Date: 10/18/2024 EXAM: CT CERVICAL SPINE WITHOUT CONTRAST 10/18/2024 04:50:09 PM TECHNIQUE: CT of the cervical spine was performed without the administration of intravenous contrast. Multiplanar reformatted images are provided for review. Automated exposure control, iterative reconstruction, and/or weight based adjustment of the mA/kV was utilized to reduce the radiation dose to  as low as reasonably achievable. COMPARISON: None available. CLINICAL HISTORY: Fall yesterday with increased back pain. FINDINGS: BONES AND ALIGNMENT: Distortion of the C2 spinous process likely reflects remote fracture, stable. Inferior endplate compression fracture at T2 is new from the prior exam and appears to be acute. No retropulsed bone is present. No traumatic malalignment. DEGENERATIVE CHANGES: Degenerative changes at C3-C4 lead to severe right foraminal stenosis. Stable moderate left foraminal narrowing is present at C4-C5. Moderate right foraminal narrowing is present at C5-C6 and C6-C7. SOFT TISSUES: WORK No prevertebral soft tissue swelling. IMPRESSION: 1. Acute inferior endplate compression fracture at T2, new from the prior exam. No retropulsed bone. 2. Severe right foraminal stenosis at C3-4. Electronically signed by: Lonni Necessary MD 10/18/2024 05:20 PM EST  RP Workstation: [id number]   DG HIPS BILAT WITH PELVIS MIN 5 VIEWS Result Date: 10/18/2024 CLINICAL DATA:  Fall and bilateral hip pain. EXAM: DG HIP (WITH OR WITHOUT PELVIS) 5+V BILAT COMPARISON:  None Available. FINDINGS: No displaced fracture. There is however an area of cortical irregularity of the left and to a lesser degree right femoral necks. Nondisplaced fractures are not excluded. Evaluation for fracture is very limited due to advanced osteopenia. There is moderate bilateral hip arthritic changes. Vascular calcifications noted. The soft tissues are unremarkable. IMPRESSION: No displaced fracture. Nondisplaced fractures of the femoral necks are not excluded. CT may provide better evaluation if there is high clinical concern for fracture. Electronically Signed   By: Vanetta Chou M.D.   On: 10/18/2024 15:09   DG Ankle Complete Right Result Date: 10/18/2024 CLINICAL DATA:  Recent fall right ankle pain EXAM: RIGHT ANKLE - COMPLETE 3+ VIEW COMPARISON:  None Available. FINDINGS: No malalignment. Mild degenerative changes.  Plantar calcaneal spur. Ossicle versus age indeterminate avulsion adjacent to the medial malleolar tip. IMPRESSION: Ossicle versus age indeterminate avulsion adjacent to the medial malleolar tip Electronically Signed   By: Luke Bun M.D.   On: 10/18/2024 15:08   DG Knee Complete 4 Views Right Result Date: 10/18/2024 EXAM: 4 OR MORE VIEW(S) XRAY OF THE RIGHT KNEE 10/18/2024 02:34:00 PM COMPARISON: None available. CLINICAL HISTORY: Fall. FINDINGS: BONES AND JOINTS: No acute fracture. No malalignment. No significant joint effusion. Mild narrowing of the articular cartilage in the medial compartment with early marginal spurring. SOFT TISSUES: Extensive femoropopliteal arterial calcifications. IMPRESSION: 1. No evidence of acute traumatic injury. Electronically signed by: Waddell Calk MD 10/18/2024 03:05 PM EST RP Workstation: [id number]   DG Knee Complete 4 Views Left Result Date: 10/18/2024 EXAM: 4 VIEW(S) XRAY OF THE LEFT KNEE 10/18/2024 02:34:00 PM COMPARISON: 05/05/2024 CLINICAL HISTORY: Fall. FINDINGS: BONES AND JOINTS: No acute fracture. No malalignment. No significant joint effusion. Mild tricompartmental degenerative changes. SOFT TISSUES: Severe atherosclerotic calcifications. IMPRESSION: 1. No evidence of acute traumatic injury. 2. Severe atherosclerotic calcifications. Electronically signed by: Norleen Boxer MD 10/18/2024 03:02 PM EST RP Workstation: [id number]   CT HEAD WO CONTRAST Result Date: 10/18/2024 EXAM: CT HEAD WITHOUT CONTRAST 10/18/2024 12:53:31 PM TECHNIQUE: CT of the head was performed without the administration of intravenous contrast. Automated exposure control, iterative reconstruction, and/or weight based adjustment of the mA/kV was utilized to reduce the radiation dose to as low as reasonably achievable. COMPARISON: Head CT 03/02/2023 and MRI 02/04/2023. CLINICAL HISTORY: Head trauma, moderate to severe. FINDINGS: BRAIN AND VENTRICLES: There is no evidence of an acute infarct,  intracranial hemorrhage, mass, midline shift, hydrocephalus, or extra-axial fluid collection. A 9 mm hyperdense focus in the dorsal right thalamus is unchanged and consistent with mineralization. Confluent hypodensities in the cerebral white matter bilaterally are similar to the prior CT and nonspecific but compatible with severe chronic small vessel ischemic disease. There is mild cerebral atrophy. Calcified atherosclerosis at the skull base. ORBITS: Bilateral cataract extraction. SINUSES: No acute abnormality. SOFT TISSUES AND SKULL: No acute soft tissue abnormality. No skull fracture. IMPRESSION: 1. No acute intracranial abnormality. 2. Severe chronic small vessel ischemic disease. Electronically signed by: Dasie Hamburg MD 10/18/2024 01:11 PM EST RP Workstation: [id number]   Data Reviewed for HPI: Relevant notes from primary care and specialist visits, past discharge summaries as available in EHR, including Care Everywhere. Prior diagnostic testing as pertinent to current admission diagnoses Updated medications and problem lists for reconciliation ED course, including vitals, labs, imaging, treatment  and response to treatment Triage notes, nursing and pharmacy notes and ED provider's notes Notable results as noted above in HPI      Assessment and Plan: * Intractable back pain Acute L2 vertebral fracture Accidental fall/ / history of frequent falls / chronic gait imbalance Multimodal pain control PT OT consult IR consult placed for consideration of kyphoplasty Keeping n.p.o. in case of procedure Can consider neurosurgical consult  Hypertensive urgency Likely secondary to pain Continue home Vasotec  Hydralazine  as needed for additional control  BPH with obstruction/lower urinary tract symptoms Chronic urinary incontinence-no acute worsening Resume meds pending med rec  Coronary artery disease History of CABG x 2 No complaints of chest pain, EKG nonacute, troponin negative Continue  enalapril  and rosuvastatin   COPD (chronic obstructive pulmonary disease) (HCC) Not acutely exacerbated DuoNebs as needed     DVT prophylaxis: SCD  Consults: IR consult for possible kyphoplasty  Advance Care Planning:   Code Status: Full Code   Family Communication: none  Disposition Plan: Back to previous home environment  Severity of Illness: The appropriate patient status for this patient is OBSERVATION. Observation status is judged to be reasonable and necessary in order to provide the required intensity of service to ensure the patient's safety. The patient's presenting symptoms, physical exam findings, and initial radiographic and laboratory data in the context of their medical condition is felt to place them at decreased risk for further clinical deterioration. Furthermore, it is anticipated that the patient will be medically stable for discharge from the hospital within 2 midnights of admission.   Author: Delayne LULLA Solian, MD 10/18/2024 9:42 PM  For on call review [email or web address].      [1] No Known Allergies  "

## 2024-10-18 NOTE — Assessment & Plan Note (Signed)
 Not acutely exacerbated DuoNebs as needed

## 2024-10-18 NOTE — ED Notes (Signed)
 Pt eating and drinking. Attempted to give urine sample and unable at this time. Pt bladder scan showed 185 cc

## 2024-10-19 ENCOUNTER — Observation Stay: Payer: Medicare (Managed Care)

## 2024-10-19 DIAGNOSIS — M549 Dorsalgia, unspecified: Secondary | ICD-10-CM | POA: Diagnosis not present

## 2024-10-19 LAB — SEDIMENTATION RATE: Sed Rate: 12 mm/h (ref 0–20)

## 2024-10-19 MED ORDER — HYDRALAZINE HCL 20 MG/ML IJ SOLN: 5.0000 mg | Freq: Four times a day (QID) | INTRAMUSCULAR | Status: AC | PRN

## 2024-10-19 NOTE — Consult Note (Signed)
 "   Chief Complaint:  Lower back pain with concern for acute fracture  Referring Provider(s): Dr. Delayne Solian  Supervising Physician: Philip Cornet  Patient Status: [facility] - In-pt  History of Present Illness: James [name] is a 89 y.o. male who presented to the ED on 1/28 at the recommendation of an outside provider due to reports of bilateral leg pain and weakness following several falls in the last month. Patient reports he has been experiencing intermittent weakness and unsteadiness on his feet for the past few months leading to increased falls. His most recent fall was a few days ago in which he fell to the floor and was there for a few hours before being found. Prior to that he had fallen, hit his head on a wall and hurt his back. In the ED he underwent CT imaging which was concerning for acute compression fracture of the inferior endplate of L2. IR was consulted for possible lumbar kyphoplasty given initial concern for acute fracture. Case and images reviewed by Dr. DELENA Philip who recommended additional MR imaging today which revealed:  1. Partially imaged acute/recent bilateral sacral insufficiency fractures. MRI pelvis could fully characterize if clinically warranted. 2. L4-L5 disc edema and mild adjacent L4 and L5 endplate marrow edema, primarily suspicious for degenerative change and/or subtle endplate fractures given known trauma. Discitis is thought less likely given no surrounding paraspinal edema, but recommend correlation with infectious/inflammatory markers. 3. The L2 inferior endplate deformity seen on same day CT has no marrow edema and is favored remote. 4. Moderate foraminal stenosis bilaterally at L5-S1 and on the left at L4-L5.  Patient resting in bed. Reports some lower back pain that is well controlled with his current medications. He denies radiation of pain anywhere or changes in bowel/bladder control. Admits to some discomfort with movement and ambulation, but is otherwise  without concerns. He reports plans for him to start wearing a TLSO brace for additional relief.   Patient is Full Code  Past Medical History:  Diagnosis Date   Actinic keratosis    Anxiety    Aortic atherosclerosis    Aortic dilatation 01/10/2021   a.) CT 01/10/2021: infrarenal aortic dilitation measuring 3.3 x 3.3 cm. b.) CT 06/23/2021: measured 3.4 cm.   Arthritis    B12 deficiency    Basal cell carcinoma 03/14/2019   R nasal tip    Bilateral renal cysts    BPH (benign prostatic hyperplasia)    Bradycardia    Chronic kidney disease    COPD (chronic obstructive pulmonary disease) (HCC)    Coronary artery disease    a.) s/p 2v CABG in 2001; performed while living in [place] .   Diastolic dysfunction 06/2020   a.) TTE 06/2020: EF 60%; sev LA dilation, mild RA dilation; triv PR, mild to mod TR/MR/AR; G1DD.   Frequency of urination    GERD (gastroesophageal reflux disease)    Hiatal hernia    History of kidney stones    HLD (hyperlipidemia)    Hypertension    Internal hemorrhoids    Long term current use of anticoagulant    a.) Rivaroxaban   Melanoma (HCC) 11/11/2022   right infra orbital - Liquid nitrogen destruction 11/17/22 Imiquimod  cream started 12/30/22 used for ~6wks, Clear 06/13/24   Mobitz type 1 second degree AV block    OSA (obstructive sleep apnea)    a.) does not require nocturnal PAP therapy   Osteopenia    Pulmonary HTN (HCC) 06/2020   a.) mild  S/P CABG x 2 2001   a.) details unknown; performed in 2001 in [place] .    Past Surgical History:  Procedure Laterality Date   CARDIAC CATHETERIZATION     COLONOSCOPY     CORONARY ARTERY BYPASS GRAFT     CYSTOSCOPY/URETEROSCOPY/HOLMIUM LASER/STENT PLACEMENT Right 11/07/2021   Procedure: CYSTOSCOPY/URETEROSCOPY/HOLMIUM LASER/STENT PLACEMENT;  Surgeon: Francisca Redell BROCKS, MD;  Location: [facility] ORS;  Service: Urology;  Laterality: Right;   FRACTURE SURGERY Left    fractured left arm   HOLEP-LASER  ENUCLEATION OF THE PROSTATE WITH MORCELLATION N/A 11/07/2021   Procedure: HOLEP-LASER ENUCLEATION OF THE PROSTATE WITH MORCELLATION;  Surgeon: Francisca Redell BROCKS, MD;  Location: [facility] ORS;  Service: Urology;  Laterality: N/A;   LIPOMA EXCISION N/A 11/21/2018   Procedure: EXCISION BACK LIPOMA;  Surgeon: Curvin Deward MOULD, MD;  Location: [facility] OR;  Service: General;  Laterality: N/A;   PILONIDAL CYST EXCISION     SKIN CANCER EXCISION     TOTAL SHOULDER REPLACEMENT Left    UMBILICAL HERNIA REPAIR N/A 11/21/2018   Procedure: HERNIA REPAIR UMBILICAL ADULT WITH MESH;  Surgeon: Curvin Deward MOULD, MD;  Location: [facility] OR;  Service: General;  Laterality: N/A;    Allergies: Patient has no known allergies.  Medications: Prior to Admission medications  Medication Sig Start Date End Date Taking? Authorizing Provider  celecoxib  (CELEBREX ) 200 MG capsule Take 1 capsule (200 mg total) by mouth 2 (two) times daily. Patient taking differently: Take 200 mg by mouth daily. 06/30/24  Yes [name], MD  citalopram  (CELEXA ) 40 MG tablet Take 40 mg by mouth daily.   Yes [provider]  enalapril  (VASOTEC ) 20 MG tablet Take 1 tablet (20 mg total) by mouth daily. 08/24/24  Yes [name], MD  pantoprazole  (PROTONIX ) 40 MG tablet TAKE 1 TABLET BY MOUTH EVERY DAY 07/17/24  Yes [name], MD  rosuvastatin  (CRESTOR ) 10 MG tablet TAKE 1 TABLET BY MOUTH EVERY DAY 10/14/24  Yes [name], MD  vitamin B-12 (CYANOCOBALAMIN) 500 MCG tablet  02/12/22   [provider]     Family History  Problem Relation Age of Onset   Prostate cancer Neg Hx    Bladder Cancer Neg Hx    Kidney cancer Neg Hx     Social History   Socioeconomic History   Marital status: Widowed    Spouse name: Not on file   Number of children: Not on file   Years of education: Not on file   Highest education level: Not on file  Occupational History   Not on file  Tobacco Use   Smoking status: Former    Current packs/day: 0.00     Average packs/day: 1 pack/day for 20.0 years (20.0 ttl pk-yrs)    Types: Cigarettes    Start date: 09/21/1993    Quit date: 09/21/2013    Years since quitting: 11.0    Passive exposure: Past   Smokeless tobacco: Never   Tobacco comments:    quit on and off over the years  Vaping Use   Vaping status: Never Used  Substance and Sexual Activity   Alcohol use: Yes    Comment: socially   Drug use: No   Sexual activity: Yes    Birth control/protection: None  Other Topics Concern   Not on file  Social History Narrative   Not on file   Social Drivers of Health   Tobacco Use: Medium Risk (10/18/2024)   Patient History    Smoking Tobacco  Use: Former    Smokeless Tobacco Use: Never    Passive Exposure: Past  Programmer, Applications: Low Risk  (08/22/2024)   Received from [facility]   Overall Financial Resource Strain (CARDIA)    Difficulty of Paying Living Expenses: Not very hard  Food Insecurity: No Food Insecurity (10/18/2024)   Epic    Worried About Programme Researcher, Broadcasting/film/video in the Last Year: Never true    Ran Out of Food in the Last Year: Never true  Transportation Needs: No Transportation Needs (10/18/2024)   Epic    Lack of Transportation (Medical): No    Lack of Transportation (Non-Medical): No  Physical Activity: Sufficiently Active (08/16/2023)   Exercise Vital Sign    Days of Exercise per Week: 7 days    Minutes of Exercise per Session: 60 min  Stress: Not on file  Social Connections: Unknown (10/18/2024)   Social Connection and Isolation Panel    Frequency of Communication with Friends and Family: Not on file    Frequency of Social Gatherings with Friends and Family: Not on file    Attends Religious Services: Not on file    Active Member of Clubs or Organizations: Not on file    Attends Banker Meetings: Not on file    Marital Status: Widowed  Depression (PHQ2-9): Medium Risk (08/16/2023)   Depression (PHQ2-9)    PHQ-2 Score: 9  Alcohol  Screen: Low Risk (08/16/2023)   Alcohol Screen    Last Alcohol Screening Score (AUDIT): 3  Housing: Low Risk (10/18/2024)   Epic    Unable to Pay for Housing in the Last Year: No    Number of Times Moved in the Last Year: 0    Homeless in the Last Year: No  Recent Concern: Housing - High Risk (07/21/2024)   Received from [facility]   Epic    In the last 12 months, was there a time when you were not able to pay the mortgage or rent on time?: Yes    In the past 12 months, how many times have you moved where you were living?: 0    At any time in the past 12 months, were you homeless or living in a shelter (including now)?: No  Utilities: Not At Risk (10/18/2024)   Epic    Threatened with loss of utilities: No  Health Literacy: Adequate Health Literacy (08/16/2023)   B1300 Health Literacy    Frequency of need for help with medical instructions: Never     Review of Systems  Musculoskeletal:  Positive for back pain (with ambulation/movement).  Neurological:  Positive for weakness (in bilateral legs).  Patient denies any headache, chest pain, shortness of breath, abdominal pain, N/V, or fever/chills. All other systems are negative.   Vital Signs: BP (!) 88/58 (BP Location: Right Arm)   Pulse 88   Temp (!) 97.5 F (36.4 C)   Resp 18   Ht 5' 9 (1.753 m)   Wt 176 lb 2.4 oz (79.9 kg)   SpO2 97%   BMI 26.01 kg/m    Physical Exam Vitals reviewed.  Constitutional:      Appearance: Normal appearance.  Cardiovascular:     Rate and Rhythm: Normal rate.  Pulmonary:     Effort: Pulmonary effort is normal.  Musculoskeletal:        General: No tenderness or deformity.     Comments: Decreased ROM due to discomfort, but no point tenderness of lumbar or sacral  spine or paraspinous muscles  Skin:    General: Skin is warm and dry.  Neurological:     Mental Status: He is alert and oriented to person, place, and time.  Psychiatric:        Behavior: Behavior normal.      Imaging: MR LUMBAR SPINE WO CONTRAST Result Date: 10/19/2024 EXAM: MRI LUMBAR SPINE 10/19/2024 10:16:55 AM TECHNIQUE: Multiplanar multisequence MRI of the lumbar spine was performed without the administration of intravenous contrast. COMPARISON: CT Lumbar spine today. CLINICAL HISTORY: Compression fracture, lumbar Lumbar compression fracture. FINDINGS: BONES AND ALIGNMENT: Normal alignment. The L2 inferior endplate deformity seen on same day CT has no marrow edema and is favored remote. Partially imaged acute/recent bilateral sacral insufficiency fractures. Mild edema in the inferior L4 and superior L5 endplates. SPINAL CORD: The conus terminates normally. SOFT TISSUES: No paraspinal mass or edema. L1-L2: Mild right eccentric disc bulge and endplate spurring. No spinal canal stenosis. Mild right foraminal stenosis. L2-L3: Mild disc bulge and endplate spurring. No spinal canal stenosis or neural foraminal narrowing. L3-L4: Mild disc bulge and endplate spurring. No spinal canal stenosis or neural foraminal narrowing. L4-L5: Disc edema. Disc bulge and endplate spurring. Bilateral facet arthropathy. Moderate left and mild right foraminal stenosis. Patent canal. L5-S1: Disc edema. Disc bulge and endplate spurring. Bilateral facet arthropathy. Moderate bilateral foraminal stenosis. Patent canal. IMPRESSION: 1. Partially imaged acute/recent bilateral sacral insufficiency fractures. MRI pelvis could fully characterize if clinically warranted. 2. L4-L5 disc edema and mild adjacent L4 and L5 endplate marrow edema, primarily suspicious for degenerative change and/or subtle endplate fractures given known trauma. Discitis is thought less likely given no surrounding paraspinal edema, but recommend correlation with infectious/inflammatory markers. 3. The L2 inferior endplate deformity seen on same day CT has no marrow edema and is favored remote. 4. Moderate foraminal stenosis bilaterally at L5-S1 and on the left at  L4-L5. Electronically signed by: Glendia Molt MD 10/19/2024 11:40 AM EST RP Workstation: [id number]   CT CHEST ABDOMEN PELVIS W CONTRAST Result Date: 10/18/2024 CLINICAL DATA:  Blunt trauma. EXAM: CT CHEST, ABDOMEN, AND PELVIS WITH CONTRAST TECHNIQUE: Multidetector CT imaging of the chest, abdomen and pelvis was performed following the standard protocol during bolus administration of intravenous contrast. RADIATION DOSE REDUCTION: This exam was performed according to the departmental dose-optimization program which includes automated exposure control, adjustment of the mA and/or kV according to patient size and/or use of iterative reconstruction technique. CONTRAST:  OMNIPAQUE  IOHEXOL  300 MG/ML  SOLN COMPARISON:  CT abdomen pelvis dated 06/23/2021. FINDINGS: CT CHEST FINDINGS Cardiovascular: Top-normal cardiac size. No pericardial effusion. Advanced 3 vessel coronary vascular calcification and postsurgical changes of CABG. There is advanced calcified and noncalcified plaque of the thoracic aorta. No aneurysmal dilatation or dissection. No periaortic fluid collection. Similar appearance of a nodular density along the posterior descending thoracic aorta as seen on the CT of 2022, likely a lymph node. The central pulmonary arteries appear patent. Mediastinum/Nodes: No hilar or mediastinal adenopathy. The esophagus is grossly unremarkable no mediastinal fluid collection. Lungs/Pleura: Background of emphysema. No focal consolidation, pleural effusion, pneumothorax. Bilateral upper lobe subpleural blebs. The central airways are patent. Musculoskeletal: Median sternotomy wires. There is osteopenia with degenerative changes of spine. Acute compression fracture of inferior endplate of L2 with approximately 30% loss of vertebral body height and anterior wedging. No retropulsion. Multiple old healed bilateral rib fractures. There is a total left shoulder arthroplasty. CT ABDOMEN PELVIS FINDINGS No intra-abdominal free  air or free fluid. Hepatobiliary: The liver  is unremarkable. No biliary dilatation. The gallbladder is unremarkable. Pancreas: Unremarkable. No pancreatic ductal dilatation or surrounding inflammatory changes. Spleen: Normal in size without focal abnormality. Adrenals/Urinary Tract: The adrenal glands unremarkable. Multiple bilateral renal cysts. Indeterminate mixed density exophytic lesion from the lateral interpolar right kidney measures approximately 1.5 x 2 cm. This appears to have been present on the prior CT of 2022 and the higher attenuating component likely represent proteinaceous debris. However, a solid component is not excluded. Ultrasound or MRI may provide better evaluation on a nonemergent/outpatient basis. Several nonobstructing bilateral renal calculi measure up to 9 mm in the interpolar left kidney. There is no hydronephrosis on either side. The visualized ureters and urinary bladder appear unremarkable. Stomach/Bowel: Several scattered colonic diverticula. There is no bowel obstruction or active inflammation. The appendix is normal. Vascular/Lymphatic: Advanced aortoiliac atherosclerotic disease. Diffusely dilated infrarenal abdominal aorta measure up to 3.5 cm. 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 Dilated common iliac arteries bilaterally measure up to 2 cm on the right. The IVC is unremarkable. No portal venous gas. There is no adenopathy. Reproductive: The prostate and seminal vesicles are grossly unremarkable. Other: None Musculoskeletal: Osteopenia with degenerative changes of the spine. No acute osseous pathology. IMPRESSION: 1. Acute compression fracture of the inferior endplate of L2 with approximately 30% loss of vertebral body height and anterior wedging. No retropulsion. 2. No other acute/traumatic intrathoracic, abdominal, or pelvic pathology. 3. Indeterminate mixed density exophytic  lesion from the lateral interpolar right kidney. Ultrasound or MRI may provide better evaluation on a nonemergent/outpatient basis. 4. Nonobstructing bilateral renal calculi. No hydronephrosis. 5. Colonic diverticulosis. No bowel obstruction. Normal appendix. 6. A 3.5 cm infrarenal abdominal aortic aneurysm. Recommend follow-up ultrasound every 3 years. (Ref.: J Vasc Surg. 2018; 67:2-77 and J Am Coll Radiol 2013;10(10):789-794.) 7. Aortic Atherosclerosis (ICD10-I70.0) and Emphysema (ICD10-J43.9). Given the presence of pulmonary emphysema, an independent risk factor for lung cancer, consider evaluating the patient for a low-dose CT lung cancer screening program. Electronically Signed   By: Vanetta Chou M.D.   On: 10/18/2024 17:39   CT L-SPINE NO CHARGE Result Date: 10/18/2024 CLINICAL DATA:  Trauma. EXAM: CT THORACIC AND LUMBAR SPINE WITHOUT CONTRAST TECHNIQUE: Multidetector CT imaging of the thoracic and lumbar spine was performed without contrast. Multiplanar CT image reconstructions were also generated. RADIATION DOSE REDUCTION: This exam was performed according to the departmental dose-optimization program which includes automated exposure control, adjustment of the mA and/or kV according to patient size and/or use of iterative reconstruction technique. COMPARISON:  CT abdomen pelvis dated 01/10/2021. FINDINGS: CT THORACIC SPINE FINDINGS Alignment: No acute subluxation. Vertebrae: There is acute compression fracture of the inferior endplate of L2 with approximately 30% loss of vertebral body height and mild anterior wedging. No retropulsion. No other acute fracture. Evaluation for fracture however is very limited due to advanced osteopenia. Paraspinal and other soft tissues: No perispinal fluid collection. Disc levels: No acute findings. Multilevel degenerative changes with disc space narrowing and endplate irregularity and spurring. Multilevel osteophyte. CT LUMBAR SPINE FINDINGS Segmentation: 5 lumbar  type vertebrae. Alignment: No acute subluxation. Vertebrae: No acute fracture.  Osteopenia. Paraspinal and other soft tissues: No paraspinal fluid collection or hematoma. Disc levels: No acute findings.  Multilevel degenerative changes. IMPRESSION: 1. Acute compression fracture of the inferior endplate of L2 with approximately 30% loss of vertebral body height and mild anterior wedging. No retropulsion. 2. No acute/traumatic thoracic or  lumbar spine pathology. 3. Multilevel degenerative changes of the thoracic and lumbar spine. Electronically Signed   By: Vanetta Chou M.D.   On: 10/18/2024 17:29   CT T-SPINE NO CHARGE Result Date: 10/18/2024 CLINICAL DATA:  Trauma. EXAM: CT THORACIC AND LUMBAR SPINE WITHOUT CONTRAST TECHNIQUE: Multidetector CT imaging of the thoracic and lumbar spine was performed without contrast. Multiplanar CT image reconstructions were also generated. RADIATION DOSE REDUCTION: This exam was performed according to the departmental dose-optimization program which includes automated exposure control, adjustment of the mA and/or kV according to patient size and/or use of iterative reconstruction technique. COMPARISON:  CT abdomen pelvis dated 01/10/2021. FINDINGS: CT THORACIC SPINE FINDINGS Alignment: No acute subluxation. Vertebrae: There is acute compression fracture of the inferior endplate of L2 with approximately 30% loss of vertebral body height and mild anterior wedging. No retropulsion. No other acute fracture. Evaluation for fracture however is very limited due to advanced osteopenia. Paraspinal and other soft tissues: No perispinal fluid collection. Disc levels: No acute findings. Multilevel degenerative changes with disc space narrowing and endplate irregularity and spurring. Multilevel osteophyte. CT LUMBAR SPINE FINDINGS Segmentation: 5 lumbar type vertebrae. Alignment: No acute subluxation. Vertebrae: No acute fracture.  Osteopenia. Paraspinal and other soft tissues: No  paraspinal fluid collection or hematoma. Disc levels: No acute findings.  Multilevel degenerative changes. IMPRESSION: 1. Acute compression fracture of the inferior endplate of L2 with approximately 30% loss of vertebral body height and mild anterior wedging. No retropulsion. 2. No acute/traumatic thoracic or lumbar spine pathology. 3. Multilevel degenerative changes of the thoracic and lumbar spine. Electronically Signed   By: Vanetta Chou M.D.   On: 10/18/2024 17:29   CT Cervical Spine Wo Contrast Result Date: 10/18/2024 EXAM: CT CERVICAL SPINE WITHOUT CONTRAST 10/18/2024 04:50:09 PM TECHNIQUE: CT of the cervical spine was performed without the administration of intravenous contrast. Multiplanar reformatted images are provided for review. Automated exposure control, iterative reconstruction, and/or weight based adjustment of the mA/kV was utilized to reduce the radiation dose to as low as reasonably achievable. COMPARISON: None available. CLINICAL HISTORY: Fall yesterday with increased back pain. FINDINGS: BONES AND ALIGNMENT: Distortion of the C2 spinous process likely reflects remote fracture, stable. Inferior endplate compression fracture at T2 is new from the prior exam and appears to be acute. No retropulsed bone is present. No traumatic malalignment. DEGENERATIVE CHANGES: Degenerative changes at C3-C4 lead to severe right foraminal stenosis. Stable moderate left foraminal narrowing is present at C4-C5. Moderate right foraminal narrowing is present at C5-C6 and C6-C7. SOFT TISSUES: WORK No prevertebral soft tissue swelling. IMPRESSION: 1. Acute inferior endplate compression fracture at T2, new from the prior exam. No retropulsed bone. 2. Severe right foraminal stenosis at C3-4. Electronically signed by: Lonni Necessary MD 10/18/2024 05:20 PM EST RP Workstation: [id number]   DG HIPS BILAT WITH PELVIS MIN 5 VIEWS Result Date: 10/18/2024 CLINICAL DATA:  Fall and bilateral hip pain. EXAM: DG HIP  (WITH OR WITHOUT PELVIS) 5+V BILAT COMPARISON:  None Available. FINDINGS: No displaced fracture. There is however an area of cortical irregularity of the left and to a lesser degree right femoral necks. Nondisplaced fractures are not excluded. Evaluation for fracture is very limited due to advanced osteopenia. There is moderate bilateral hip arthritic changes. Vascular calcifications noted. The soft tissues are unremarkable. IMPRESSION: No displaced fracture. Nondisplaced fractures of the femoral necks are not excluded. CT may provide better evaluation if there is high clinical concern for fracture. Electronically Signed   By: Vanetta Chou HERO.D.  On: 10/18/2024 15:09   DG Ankle Complete Right Result Date: 10/18/2024 CLINICAL DATA:  Recent fall right ankle pain EXAM: RIGHT ANKLE - COMPLETE 3+ VIEW COMPARISON:  None Available. FINDINGS: No malalignment. Mild degenerative changes. Plantar calcaneal spur. Ossicle versus age indeterminate avulsion adjacent to the medial malleolar tip. IMPRESSION: Ossicle versus age indeterminate avulsion adjacent to the medial malleolar tip Electronically Signed   By: Luke Bun M.D.   On: 10/18/2024 15:08   DG Knee Complete 4 Views Right Result Date: 10/18/2024 EXAM: 4 OR MORE VIEW(S) XRAY OF THE RIGHT KNEE 10/18/2024 02:34:00 PM COMPARISON: None available. CLINICAL HISTORY: Fall. FINDINGS: BONES AND JOINTS: No acute fracture. No malalignment. No significant joint effusion. Mild narrowing of the articular cartilage in the medial compartment with early marginal spurring. SOFT TISSUES: Extensive femoropopliteal arterial calcifications. IMPRESSION: 1. No evidence of acute traumatic injury. Electronically signed by: Waddell Calk MD 10/18/2024 03:05 PM EST RP Workstation: [id number]   DG Knee Complete 4 Views Left Result Date: 10/18/2024 EXAM: 4 VIEW(S) XRAY OF THE LEFT KNEE 10/18/2024 02:34:00 PM COMPARISON: 05/05/2024 CLINICAL HISTORY: Fall. FINDINGS: BONES AND JOINTS: No  acute fracture. No malalignment. No significant joint effusion. Mild tricompartmental degenerative changes. SOFT TISSUES: Severe atherosclerotic calcifications. IMPRESSION: 1. No evidence of acute traumatic injury. 2. Severe atherosclerotic calcifications. Electronically signed by: Norleen Boxer MD 10/18/2024 03:02 PM EST RP Workstation: [id number]   CT HEAD WO CONTRAST Result Date: 10/18/2024 EXAM: CT HEAD WITHOUT CONTRAST 10/18/2024 12:53:31 PM TECHNIQUE: CT of the head was performed without the administration of intravenous contrast. Automated exposure control, iterative reconstruction, and/or weight based adjustment of the mA/kV was utilized to reduce the radiation dose to as low as reasonably achievable. COMPARISON: Head CT 03/02/2023 and MRI 02/04/2023. CLINICAL HISTORY: Head trauma, moderate to severe. FINDINGS: BRAIN AND VENTRICLES: There is no evidence of an acute infarct, intracranial hemorrhage, mass, midline shift, hydrocephalus, or extra-axial fluid collection. A 9 mm hyperdense focus in the dorsal right thalamus is unchanged and consistent with mineralization. Confluent hypodensities in the cerebral white matter bilaterally are similar to the prior CT and nonspecific but compatible with severe chronic small vessel ischemic disease. There is mild cerebral atrophy. Calcified atherosclerosis at the skull base. ORBITS: Bilateral cataract extraction. SINUSES: No acute abnormality. SOFT TISSUES AND SKULL: No acute soft tissue abnormality. No skull fracture. IMPRESSION: 1. No acute intracranial abnormality. 2. Severe chronic small vessel ischemic disease. Electronically signed by: Dasie Hamburg MD 10/18/2024 01:11 PM EST RP Workstation: [id number]    Labs:  CBC: Recent Labs    12/28/23 1023 07/05/24 0954 10/18/24 1208  WBC 6.1 6.0 6.9  HGB 13.4 13.2 13.7  HCT 40.4 40.7 39.5  PLT 158 195 194    COAGS: No results for input(s): INR, APTT in the last 8760 hours.  BMP: Recent Labs     12/28/23 1023 07/05/24 0954 10/18/24 1208  NA 142 141 142  K 4.4 4.2 3.7  CL 108* 105 107  CO2 23 23 24   GLUCOSE 112* 90 128*  BUN 17 19 20   CALCIUM  9.6 10.0 9.8  CREATININE 1.08 0.97 0.89  GFRNONAA  --   --  >60    LIVER FUNCTION TESTS: Recent Labs    12/28/23 1023 07/05/24 0954 10/18/24 1208  BILITOT 0.7 0.8 0.9  AST 23 19 34  ALT 14 14 15   ALKPHOS 91 91 180*  PROT 6.2 6.5 7.2  ALBUMIN 4.0 4.1 4.3    TUMOR MARKERS: No results for input(s): AFPTM, CEA, CA199, CHROMGRNA  in the last 8760 hours.  Assessment and Plan:  Lower Back pain secondary to multiple falls: DONTRAE MORINI is a 89 y.o. male with a history of bilateral lower extremity weakness and discomfort that has led to several falls in the past month. Patient reports this has been an ongoing concern for several months. Initial CT imaging with concern for possible acute fracture; however, MR with likely remote fracture and no acute abnormalities. Case reviewed by Dr. Philip who does not feel that IR intervention is warranted at this time.  -Given MR with no evidence of acute fracture, IR has no plans for intervention at this time -Team updated and aware of plan -Recommend continued conservative management with TLSO brace, physical therapy, and pain management with medications  IR will sign off Please reach out to IR with any additional questions or concerns or if our service can be of any additional help.   Thank you for allowing our service to participate in JIOVANNI HEETER 's care.    Electronically Signed: Glennon CHRISTELLA Bal, PA-C   10/19/2024, 4:24 PM     I spent a total of 40 Minutes  in face to face in clinical consultation, greater than 50% of which was counseling/coordinating care for possible kyphoplasty. "

## 2024-10-19 NOTE — Progress Notes (Signed)
 BP 187/108, MD Ayiku notified. Will give hydralazine .  Reassess 1040: BP 166/97.

## 2024-10-19 NOTE — Plan of Care (Signed)

## 2024-10-19 NOTE — Evaluation (Signed)
 Occupational Therapy Evaluation Patient Details Name: James [name] MRN: [id number] DOB: [birth year] Today's Date: 10/19/2024   History of Present Illness   Pt is an 89 y/o M admitted on 10/18/24 after presenting with c/o falls. Pt found to have acute L2 vertebral fx. PMH: CAD s/p CABG, COPD, CKD, HTN, ambulatory dysfunction, anxiety     Clinical Impressions James [name] was seen for OT/PT co-evaluation this date. Prior to hospital admission, pt was residing at home, alone, with friends/family available to assist him with IADL management. Pt presents with deficits in strength, balance, LB access, and activity tolerance limiting his ability to perform ADL management at baseline level. Pt currently requires MIN A +2 for STS t/fs, MAX A for LB ADL management, close CGA for safety for SPT, SET UP/SUP for seated UB ADL management.  Pt would benefit from skilled OT services to address noted impairments and functional limitations (see below for any additional details) in order to maximize safety and independence while minimizing future risk of falls, injury, and readmission. Anticipate the need for follow up OT services upon acute hospital DC.      If plan is discharge home, recommend the following:   A little help with walking and/or transfers;A lot of help with bathing/dressing/bathroom     Functional Status Assessment   Patient has had a recent decline in their functional status and demonstrates the ability to make significant improvements in function in a reasonable and predictable amount of time.     Equipment Recommendations   BSC/3in1     Recommendations for Other Services         Precautions/Restrictions   Precautions Precautions: Fall;Back Restrictions Weight Bearing Restrictions Per Provider Order: No     Mobility Bed Mobility Overal bed mobility: Needs Assistance Bed Mobility: Rolling, Sidelying to Sit Rolling: Min assist, +2 for physical assistance, Used  rails Sidelying to sit: Min assist, +2 for physical assistance, Used rails       General bed mobility comments: education re: log rolling    Transfers Overall transfer level: Needs assistance Equipment used: Rolling walker (2 wheels) Transfers: Sit to/from Stand, Bed to chair/wheelchair/BSC Sit to Stand: Min assist, +2 physical assistance, +2 safety/equipment     Step pivot transfers: Min assist, +2 physical assistance, +2 safety/equipment     General transfer comment: education re: hand placement, body mechanics for STS      Balance Overall balance assessment: Needs assistance, History of Falls Sitting-balance support: Feet supported Sitting balance-Leahy Scale: Fair Sitting balance - Comments: steady static sitting reaching inside BOS.   Standing balance support: During functional activity, Bilateral upper extremity supported, Reliant on assistive device for balance Standing balance-Leahy Scale: Fair                             ADL either performed or assessed with clinical judgement   ADL Overall ADL's : Needs assistance/impaired                                       General ADL Comments: MIN A +2 for STS t/fs, anticipate MAX A for LB ADL management from STS, close CGA for safety with toilet transfers.     Vision Patient Visual Report: No change from baseline       Perception         Praxis  Pertinent Vitals/Pain Pain Assessment Pain Assessment: 0-10 Pain Score: 6  Pain Location: back Pain Descriptors / Indicators: Discomfort Pain Intervention(s): Limited activity within patient's tolerance, Monitored during session, Repositioned     Extremity/Trunk Assessment Upper Extremity Assessment Upper Extremity Assessment: Overall WFL for tasks assessed   Lower Extremity Assessment Lower Extremity Assessment: Generalized weakness       Communication Communication Communication: No apparent difficulties   Cognition  Arousal: Alert Behavior During Therapy: WFL for tasks assessed/performed Cognition: No apparent impairments                               Following commands: Intact       Cueing  General Comments   Cueing Techniques: Verbal cues  Educated pt on back precautions.   Exercises Other Exercises Other Exercises: Educated on safety, falls prevention strategies, and compensatory ADL management in setting of pack pain/precautions.   Shoulder Instructions      Home Living Family/patient expects to be discharged to:: Private residence Living Arrangements: Alone Available Help at Discharge: Friend(s);Family;Available PRN/intermittently Type of Home: Apartment Home Access: Elevator     Home Layout: One level     Bathroom Shower/Tub: Chief Strategy Officer: Handicapped height     Home Equipment: Agricultural Consultant (2 wheels);Rollator (4 wheels);Cane - single point;Tub bench;Grab bars - tub/shower;Grab bars - toilet;Hand held shower head;Other (comment);Lift chair   Additional Comments: is paying for life alert but lost necklace so has to get a new one      Prior Functioning/Environment Prior Level of Function : Independent/Modified Independent             Mobility Comments: at least 3 falls in the past week, 3 prior to this, ambulatory with RW vs rollator ADLs Comments: independent with bathing & dressing, wears briefs for urinary incontinence. Dtr lives nearby and assists with IADLs PRN. Pt also has a neighbor who assists him with driving.    OT Problem List: Decreased strength;Decreased coordination;Impaired balance (sitting and/or standing);Decreased knowledge of use of DME or AE;Decreased safety awareness;Decreased activity tolerance;Pain;Decreased knowledge of precautions   OT Treatment/Interventions: Self-care/ADL training;Therapeutic exercise;Therapeutic activities;DME and/or AE instruction;Patient/family education;Balance training;Energy  conservation      OT Goals(Current goals can be found in the care plan section)   Acute Rehab OT Goals Patient Stated Goal: To go home OT Goal Formulation: With patient/family Time For Goal Achievement: 11/02/24 Potential to Achieve Goals: Good ADL Goals Pt Will Perform Grooming: sitting;with set-up;with supervision Pt Will Perform Lower Body Dressing: sit to/from stand;with supervision;with set-up;with adaptive equipment Pt Will Transfer to Toilet: bedside commode;with set-up;with supervision Pt Will Perform Toileting - Clothing Manipulation and hygiene: with adaptive equipment;with supervision;with set-up;sit to/from stand   OT Frequency:  Min 2X/week    Co-evaluation PT/OT/SLP Co-Evaluation/Treatment: Yes Reason for Co-Treatment: Other (comment);To address functional/ADL transfers PT goals addressed during session: Balance;Mobility/safety with mobility;Proper use of DME OT goals addressed during session: ADL's and self-care;Proper use of Adaptive equipment and DME      AM-PAC OT 6 Clicks Daily Activity     Outcome Measure Help from another person eating meals?: A Little Help from another person taking care of personal grooming?: A Little Help from another person toileting, which includes using toliet, bedpan, or urinal?: A Little Help from another person bathing (including washing, rinsing, drying)?: A Little Help from another person to put on and taking off regular upper body clothing?: A Little Help from another  person to put on and taking off regular lower body clothing?: A Little 6 Click Score: 18   End of Session Equipment Utilized During Treatment: Gait belt;Rolling walker (2 wheels) Nurse Communication: Mobility status  Activity Tolerance: Patient tolerated treatment well;Patient limited by fatigue Patient left: in bed  OT Visit Diagnosis: Other abnormalities of gait and mobility (R26.89);Muscle weakness (generalized) (M62.81);Pain Pain - Right/Left:   (both) Pain - part of body:  (back)                Time: 8898-8866 OT Time Calculation (min): 32 min Charges:  OT General Charges $OT Visit: 1 Visit OT Evaluation $OT Eval Moderate Complexity: 1 Mod OT Treatments $Self Care/Home Management : 8-22 mins  Jhonny Pelton, M.S., OTR/L 10/19/24, 1:25 PM

## 2024-10-19 NOTE — Progress Notes (Addendum)
 "    Progress Note    James [name]  FMW:[id number] DOB: [birth year]  DOA: 10/18/2024 PCP: [name], MD      Brief Narrative:    Medical records reviewed and are as summarized below:  James [name] is a 89 y.o. male  with medical history significant for CAD s/p CABG, COPD, CKD, HTN, ambulatory dysfunction, who presented to the hospital because of low back pain, general weakness and recurrent falls at home.  He is falling about twice in 3 days.  Reportedly, he struck his head once.  Of note, patient had been previously evaluated in the past for tremors, shuffling gait, falls and cognitive dysfunction in October 2024.  Previous MRI brain did not show any acute abnormality.  Vitals in the ED significant for BP up to 202/103.  CT spine was concerning for acute L2 fracture.      Assessment/Plan:   Principal Problem:   Intractable back pain Active Problems:   Accidental fall   Acute L2 vertebral fracture (HCC)   Weakness of both lower extremities   Frequent falls   Abnormality of gait due to impairment of balance   Hypertensive urgency   BPH with obstruction/lower urinary tract symptoms   S/P CABG x 2   Coronary artery disease   COPD (chronic obstructive pulmonary disease) (HCC)   Body mass index is 26.01 kg/m.   Intractable low back pain, suspected acute L2 vertebral fracture: MRI lumbar spine showed partially imaged acute/recent bilateral sacral insufficiency fractures, possibly remote L2 fracture.  Suspected L4-L5 disc edema and endplate marrow edema suspicious for degenerative changes, moderate foraminal stenosis bilaterally at L5-S1 and on the left at L4-L5. Analgesics as needed for pain.  PT and OT evaluation. TLSO brace ordered as requested by daughter. Follow-up with interventional radiologist for further recommendations.   Hypertensive urgency: Pain may be contributing to significantly elevated BP Continue enalapril .  Hydralazine  as needed for  severe hypertension.   CAD with history of CABG: Continue statin   Comorbidities include COPD, BPH.  Diet Order             Diet NPO time specified Except for: Ice Chips, Sips with Meds  Diet effective midnight                                  Consultants: Interventional radiologist  Procedures: None    Medications:    celecoxib   200 mg Oral BID   enalapril   20 mg Oral Daily   lidocaine   1 patch Transdermal Q24H   pantoprazole   40 mg Oral Daily   rosuvastatin   10 mg Oral Daily   Continuous Infusions:   Anti-infectives (From admission, onward)    None              Family Communication/Anticipated D/C date and plan/Code Status   DVT prophylaxis: SCDs Start: 10/18/24 1935     Code Status: Full Code  Family Communication: Plan discussed with Niels, daughter, over the phone Disposition Plan: Plan to discharge to SNF   Status is: Observation The patient will require care spanning > 2 midnights and should be moved to inpatient because: Acute L2 fracture       Subjective:   Interval events noted.  He complains of low back pain and feeling sore all over.  Objective:    Vitals:   10/19/24 0315 10/19/24 0443 10/19/24 0933 10/19/24 1046  BP: (!) 179/91 ROLLEN)  164/85 (!) 187/108 (!) 166/97  Pulse: 60 60 86   Resp: 18  17   Temp: 98 F (36.7 C)  97.8 F (36.6 C)   TempSrc: Oral     SpO2: 97%  95%   Weight:      Height:       No data found.   Intake/Output Summary (Last 24 hours) at 10/19/2024 1232 Last data filed at 10/19/2024 9060 Gross per 24 hour  Intake --  Output 700 ml  Net -700 ml   Filed Weights   10/18/24 1204 10/18/24 2118  Weight: 80.7 kg 79.9 kg    Exam:  GEN: NAD SKIN: Warm and dry EYES: No pallor or icterus ENT: MMM CV: RRR PULM: CTA B ABD: soft, ND, NT, +BS CNS: AAO x 3, non focal EXT: No edema or tenderness MSK: Mild sacral spinal tenderness         Data Reviewed:   I have  personally reviewed following labs and imaging studies:  Labs: Labs show the following:   Basic Metabolic Panel: Recent Labs  Lab 10/18/24 1208  NA 142  K 3.7  CL 107  CO2 24  GLUCOSE 128*  BUN 20  CREATININE 0.89  CALCIUM  9.8   GFR Estimated Creatinine Clearance: 59.6 mL/min (by C-G formula based on SCr of 0.89 mg/dL). Liver Function Tests: Recent Labs  Lab 10/18/24 1208  AST 34  ALT 15  ALKPHOS 180*  BILITOT 0.9  PROT 7.2  ALBUMIN 4.3   No results for input(s): LIPASE, AMYLASE in the last 168 hours. No results for input(s): AMMONIA in the last 168 hours. Coagulation profile No results for input(s): INR, PROTIME in the last 168 hours.  CBC: Recent Labs  Lab 10/18/24 1208  WBC 6.9  HGB 13.7  HCT 39.5  MCV 93.2  PLT 194   Cardiac Enzymes: Recent Labs  Lab 10/18/24 1208  CKTOTAL 405*   BNP (last 3 results) No results for input(s): PROBNP in the last 8760 hours. CBG: Recent Labs  Lab 10/18/24 1342  GLUCAP 111*   D-Dimer: No results for input(s): DDIMER in the last 72 hours. Hgb A1c: No results for input(s): HGBA1C in the last 72 hours. Lipid Profile: No results for input(s): CHOL, HDL, LDLCALC, TRIG, CHOLHDL, LDLDIRECT in the last 72 hours. Thyroid function studies: No results for input(s): TSH, T4TOTAL, T3FREE, THYROIDAB in the last 72 hours.  Invalid input(s): FREET3 Anemia work up: No results for input(s): VITAMINB12, FOLATE, FERRITIN, TIBC, IRON, RETICCTPCT in the last 72 hours. Sepsis Labs: Recent Labs  Lab 10/18/24 1208  WBC 6.9    Microbiology No results found for this or any previous visit (from the past 240 hours).  Procedures and diagnostic studies:  MR LUMBAR SPINE WO CONTRAST Result Date: 10/19/2024 EXAM: MRI LUMBAR SPINE 10/19/2024 10:16:55 AM TECHNIQUE: Multiplanar multisequence MRI of the lumbar spine was performed without the administration of intravenous contrast.  COMPARISON: CT Lumbar spine today. CLINICAL HISTORY: Compression fracture, lumbar Lumbar compression fracture. FINDINGS: BONES AND ALIGNMENT: Normal alignment. The L2 inferior endplate deformity seen on same day CT has no marrow edema and is favored remote. Partially imaged acute/recent bilateral sacral insufficiency fractures. Mild edema in the inferior L4 and superior L5 endplates. SPINAL CORD: The conus terminates normally. SOFT TISSUES: No paraspinal mass or edema. L1-L2: Mild right eccentric disc bulge and endplate spurring. No spinal canal stenosis. Mild right foraminal stenosis. L2-L3: Mild disc bulge and endplate spurring. No spinal canal stenosis or neural foraminal narrowing.  L3-L4: Mild disc bulge and endplate spurring. No spinal canal stenosis or neural foraminal narrowing. L4-L5: Disc edema. Disc bulge and endplate spurring. Bilateral facet arthropathy. Moderate left and mild right foraminal stenosis. Patent canal. L5-S1: Disc edema. Disc bulge and endplate spurring. Bilateral facet arthropathy. Moderate bilateral foraminal stenosis. Patent canal. IMPRESSION: 1. Partially imaged acute/recent bilateral sacral insufficiency fractures. MRI pelvis could fully characterize if clinically warranted. 2. L4-L5 disc edema and mild adjacent L4 and L5 endplate marrow edema, primarily suspicious for degenerative change and/or subtle endplate fractures given known trauma. Discitis is thought less likely given no surrounding paraspinal edema, but recommend correlation with infectious/inflammatory markers. 3. The L2 inferior endplate deformity seen on same day CT has no marrow edema and is favored remote. 4. Moderate foraminal stenosis bilaterally at L5-S1 and on the left at L4-L5. Electronically signed by: Glendia Molt MD 10/19/2024 11:40 AM EST RP Workstation: [id number]   CT CHEST ABDOMEN PELVIS W CONTRAST Result Date: 10/18/2024 CLINICAL DATA:  Blunt trauma. EXAM: CT CHEST, ABDOMEN, AND PELVIS WITH CONTRAST  TECHNIQUE: Multidetector CT imaging of the chest, abdomen and pelvis was performed following the standard protocol during bolus administration of intravenous contrast. RADIATION DOSE REDUCTION: This exam was performed according to the departmental dose-optimization program which includes automated exposure control, adjustment of the mA and/or kV according to patient size and/or use of iterative reconstruction technique. CONTRAST:  OMNIPAQUE  IOHEXOL  300 MG/ML  SOLN COMPARISON:  CT abdomen pelvis dated 06/23/2021. FINDINGS: CT CHEST FINDINGS Cardiovascular: Top-normal cardiac size. No pericardial effusion. Advanced 3 vessel coronary vascular calcification and postsurgical changes of CABG. There is advanced calcified and noncalcified plaque of the thoracic aorta. No aneurysmal dilatation or dissection. No periaortic fluid collection. Similar appearance of a nodular density along the posterior descending thoracic aorta as seen on the CT of 2022, likely a lymph node. The central pulmonary arteries appear patent. Mediastinum/Nodes: No hilar or mediastinal adenopathy. The esophagus is grossly unremarkable no mediastinal fluid collection. Lungs/Pleura: Background of emphysema. No focal consolidation, pleural effusion, pneumothorax. Bilateral upper lobe subpleural blebs. The central airways are patent. Musculoskeletal: Median sternotomy wires. There is osteopenia with degenerative changes of spine. Acute compression fracture of inferior endplate of L2 with approximately 30% loss of vertebral body height and anterior wedging. No retropulsion. Multiple old healed bilateral rib fractures. There is a total left shoulder arthroplasty. CT ABDOMEN PELVIS FINDINGS No intra-abdominal free air or free fluid. Hepatobiliary: The liver is unremarkable. No biliary dilatation. The gallbladder is unremarkable. Pancreas: Unremarkable. No pancreatic ductal dilatation or surrounding inflammatory changes. Spleen: Normal in size without  focal abnormality. Adrenals/Urinary Tract: The adrenal glands unremarkable. Multiple bilateral renal cysts. Indeterminate mixed density exophytic lesion from the lateral interpolar right kidney measures approximately 1.5 x 2 cm. This appears to have been present on the prior CT of 2022 and the higher attenuating component likely represent proteinaceous debris. However, a solid component is not excluded. Ultrasound or MRI may provide better evaluation on a nonemergent/outpatient basis. Several nonobstructing bilateral renal calculi measure up to 9 mm in the interpolar left kidney. There is no hydronephrosis on either side. The visualized ureters and urinary bladder appear unremarkable. Stomach/Bowel: Several scattered colonic diverticula. There is no bowel obstruction or active inflammation. The appendix is normal. Vascular/Lymphatic: Advanced aortoiliac atherosclerotic disease. Diffusely dilated infrarenal abdominal aorta measure up to 3.5 cm. 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 Dilated common iliac arteries  bilaterally measure up to 2 cm on the right. The IVC is unremarkable. No portal venous gas. There is no adenopathy. Reproductive: The prostate and seminal vesicles are grossly unremarkable. Other: None Musculoskeletal: Osteopenia with degenerative changes of the spine. No acute osseous pathology. IMPRESSION: 1. Acute compression fracture of the inferior endplate of L2 with approximately 30% loss of vertebral body height and anterior wedging. No retropulsion. 2. No other acute/traumatic intrathoracic, abdominal, or pelvic pathology. 3. Indeterminate mixed density exophytic lesion from the lateral interpolar right kidney. Ultrasound or MRI may provide better evaluation on a nonemergent/outpatient basis. 4. Nonobstructing bilateral renal calculi. No hydronephrosis. 5. Colonic diverticulosis. No bowel obstruction.  Normal appendix. 6. A 3.5 cm infrarenal abdominal aortic aneurysm. Recommend follow-up ultrasound every 3 years. (Ref.: J Vasc Surg. 2018; 67:2-77 and J Am Coll Radiol 2013;10(10):789-794.) 7. Aortic Atherosclerosis (ICD10-I70.0) and Emphysema (ICD10-J43.9). Given the presence of pulmonary emphysema, an independent risk factor for lung cancer, consider evaluating the patient for a low-dose CT lung cancer screening program. Electronically Signed   By: Vanetta Chou M.D.   On: 10/18/2024 17:39   CT L-SPINE NO CHARGE Result Date: 10/18/2024 CLINICAL DATA:  Trauma. EXAM: CT THORACIC AND LUMBAR SPINE WITHOUT CONTRAST TECHNIQUE: Multidetector CT imaging of the thoracic and lumbar spine was performed without contrast. Multiplanar CT image reconstructions were also generated. RADIATION DOSE REDUCTION: This exam was performed according to the departmental dose-optimization program which includes automated exposure control, adjustment of the mA and/or kV according to patient size and/or use of iterative reconstruction technique. COMPARISON:  CT abdomen pelvis dated 01/10/2021. FINDINGS: CT THORACIC SPINE FINDINGS Alignment: No acute subluxation. Vertebrae: There is acute compression fracture of the inferior endplate of L2 with approximately 30% loss of vertebral body height and mild anterior wedging. No retropulsion. No other acute fracture. Evaluation for fracture however is very limited due to advanced osteopenia. Paraspinal and other soft tissues: No perispinal fluid collection. Disc levels: No acute findings. Multilevel degenerative changes with disc space narrowing and endplate irregularity and spurring. Multilevel osteophyte. CT LUMBAR SPINE FINDINGS Segmentation: 5 lumbar type vertebrae. Alignment: No acute subluxation. Vertebrae: No acute fracture.  Osteopenia. Paraspinal and other soft tissues: No paraspinal fluid collection or hematoma. Disc levels: No acute findings.  Multilevel degenerative changes.  IMPRESSION: 1. Acute compression fracture of the inferior endplate of L2 with approximately 30% loss of vertebral body height and mild anterior wedging. No retropulsion. 2. No acute/traumatic thoracic or lumbar spine pathology. 3. Multilevel degenerative changes of the thoracic and lumbar spine. Electronically Signed   By: Vanetta Chou M.D.   On: 10/18/2024 17:29   CT T-SPINE NO CHARGE Result Date: 10/18/2024 CLINICAL DATA:  Trauma. EXAM: CT THORACIC AND LUMBAR SPINE WITHOUT CONTRAST TECHNIQUE: Multidetector CT imaging of the thoracic and lumbar spine was performed without contrast. Multiplanar CT image reconstructions were also generated. RADIATION DOSE REDUCTION: This exam was performed according to the departmental dose-optimization program which includes automated exposure control, adjustment of the mA and/or kV according to patient size and/or use of iterative reconstruction technique. COMPARISON:  CT abdomen pelvis dated 01/10/2021. FINDINGS: CT THORACIC SPINE FINDINGS Alignment: No acute subluxation. Vertebrae: There is acute compression fracture of the inferior endplate of L2 with approximately 30% loss of vertebral body height and mild anterior wedging. No retropulsion. No other acute fracture. Evaluation for fracture however is very limited due to advanced osteopenia. Paraspinal and other soft tissues: No perispinal fluid collection. Disc levels: No acute findings. Multilevel degenerative changes with disc space narrowing  and endplate irregularity and spurring. Multilevel osteophyte. CT LUMBAR SPINE FINDINGS Segmentation: 5 lumbar type vertebrae. Alignment: No acute subluxation. Vertebrae: No acute fracture.  Osteopenia. Paraspinal and other soft tissues: No paraspinal fluid collection or hematoma. Disc levels: No acute findings.  Multilevel degenerative changes. IMPRESSION: 1. Acute compression fracture of the inferior endplate of L2 with approximately 30% loss of vertebral body height and mild  anterior wedging. No retropulsion. 2. No acute/traumatic thoracic or lumbar spine pathology. 3. Multilevel degenerative changes of the thoracic and lumbar spine. Electronically Signed   By: Vanetta Chou M.D.   On: 10/18/2024 17:29   CT Cervical Spine Wo Contrast Result Date: 10/18/2024 EXAM: CT CERVICAL SPINE WITHOUT CONTRAST 10/18/2024 04:50:09 PM TECHNIQUE: CT of the cervical spine was performed without the administration of intravenous contrast. Multiplanar reformatted images are provided for review. Automated exposure control, iterative reconstruction, and/or weight based adjustment of the mA/kV was utilized to reduce the radiation dose to as low as reasonably achievable. COMPARISON: None available. CLINICAL HISTORY: Fall yesterday with increased back pain. FINDINGS: BONES AND ALIGNMENT: Distortion of the C2 spinous process likely reflects remote fracture, stable. Inferior endplate compression fracture at T2 is new from the prior exam and appears to be acute. No retropulsed bone is present. No traumatic malalignment. DEGENERATIVE CHANGES: Degenerative changes at C3-C4 lead to severe right foraminal stenosis. Stable moderate left foraminal narrowing is present at C4-C5. Moderate right foraminal narrowing is present at C5-C6 and C6-C7. SOFT TISSUES: WORK No prevertebral soft tissue swelling. IMPRESSION: 1. Acute inferior endplate compression fracture at T2, new from the prior exam. No retropulsed bone. 2. Severe right foraminal stenosis at C3-4. Electronically signed by: Lonni Necessary MD 10/18/2024 05:20 PM EST RP Workstation: [id number]   DG HIPS BILAT WITH PELVIS MIN 5 VIEWS Result Date: 10/18/2024 CLINICAL DATA:  Fall and bilateral hip pain. EXAM: DG HIP (WITH OR WITHOUT PELVIS) 5+V BILAT COMPARISON:  None Available. FINDINGS: No displaced fracture. There is however an area of cortical irregularity of the left and to a lesser degree right femoral necks. Nondisplaced fractures are not excluded.  Evaluation for fracture is very limited due to advanced osteopenia. There is moderate bilateral hip arthritic changes. Vascular calcifications noted. The soft tissues are unremarkable. IMPRESSION: No displaced fracture. Nondisplaced fractures of the femoral necks are not excluded. CT may provide better evaluation if there is high clinical concern for fracture. Electronically Signed   By: Vanetta Chou M.D.   On: 10/18/2024 15:09   DG Ankle Complete Right Result Date: 10/18/2024 CLINICAL DATA:  Recent fall right ankle pain EXAM: RIGHT ANKLE - COMPLETE 3+ VIEW COMPARISON:  None Available. FINDINGS: No malalignment. Mild degenerative changes. Plantar calcaneal spur. Ossicle versus age indeterminate avulsion adjacent to the medial malleolar tip. IMPRESSION: Ossicle versus age indeterminate avulsion adjacent to the medial malleolar tip Electronically Signed   By: Luke Bun M.D.   On: 10/18/2024 15:08   DG Knee Complete 4 Views Right Result Date: 10/18/2024 EXAM: 4 OR MORE VIEW(S) XRAY OF THE RIGHT KNEE 10/18/2024 02:34:00 PM COMPARISON: None available. CLINICAL HISTORY: Fall. FINDINGS: BONES AND JOINTS: No acute fracture. No malalignment. No significant joint effusion. Mild narrowing of the articular cartilage in the medial compartment with early marginal spurring. SOFT TISSUES: Extensive femoropopliteal arterial calcifications. IMPRESSION: 1. No evidence of acute traumatic injury. Electronically signed by: Waddell Calk MD 10/18/2024 03:05 PM EST RP Workstation: [id number]   DG Knee Complete 4 Views Left Result Date: 10/18/2024 EXAM: 4 VIEW(S) XRAY OF THE LEFT  KNEE 10/18/2024 02:34:00 PM COMPARISON: 05/05/2024 CLINICAL HISTORY: Fall. FINDINGS: BONES AND JOINTS: No acute fracture. No malalignment. No significant joint effusion. Mild tricompartmental degenerative changes. SOFT TISSUES: Severe atherosclerotic calcifications. IMPRESSION: 1. No evidence of acute traumatic injury. 2. Severe atherosclerotic  calcifications. Electronically signed by: Norleen Boxer MD 10/18/2024 03:02 PM EST RP Workstation: [id number]   CT HEAD WO CONTRAST Result Date: 10/18/2024 EXAM: CT HEAD WITHOUT CONTRAST 10/18/2024 12:53:31 PM TECHNIQUE: CT of the head was performed without the administration of intravenous contrast. Automated exposure control, iterative reconstruction, and/or weight based adjustment of the mA/kV was utilized to reduce the radiation dose to as low as reasonably achievable. COMPARISON: Head CT 03/02/2023 and MRI 02/04/2023. CLINICAL HISTORY: Head trauma, moderate to severe. FINDINGS: BRAIN AND VENTRICLES: There is no evidence of an acute infarct, intracranial hemorrhage, mass, midline shift, hydrocephalus, or extra-axial fluid collection. A 9 mm hyperdense focus in the dorsal right thalamus is unchanged and consistent with mineralization. Confluent hypodensities in the cerebral white matter bilaterally are similar to the prior CT and nonspecific but compatible with severe chronic small vessel ischemic disease. There is mild cerebral atrophy. Calcified atherosclerosis at the skull base. ORBITS: Bilateral cataract extraction. SINUSES: No acute abnormality. SOFT TISSUES AND SKULL: No acute soft tissue abnormality. No skull fracture. IMPRESSION: 1. No acute intracranial abnormality. 2. Severe chronic small vessel ischemic disease. Electronically signed by: Dasie Hamburg MD 10/18/2024 01:11 PM EST RP Workstation: [id number]               LOS: 0 days   Mackie Holness  [facility]   Pager on [email or web address]. If 7PM-7AM, please contact night-coverage at [email or web address]     10/19/2024, 12:32 PM           "

## 2024-10-19 NOTE — Evaluation (Signed)
 Physical Therapy Evaluation Patient Details Name: James [name] MRN: [id number] DOB: Oct 14, 1935 Today's Date: 10/19/2024  History of Present Illness  Pt is an 89 y/o M admitted on 10/18/24 after presenting with c/o falls. Pt found to have acute L2 vertebral fx. PMH: CAD s/p CABG, COPD, CKD, HTN, ambulatory dysfunction, anxiety  Clinical Impression  Pt seen for PT evaluation with pt agreeable to tx, daughter present for session. Pt reports prior to admission he was ambulatory with RW or rollator but endorses many falls, with 3 prior to admission. On this date, PT educated pt on back precautions, log rolling technique. Pt requires min assist +2 for bed mobility & step pivot bed>recliner on R with RW. Pt politely defers gait attempts 2/2 fatigue, pain. Recommend ongoing PT services to progress mobility & reduce fall risk. Recommend post acute rehab <3 hours therapy/day upon d/c.        If plan is discharge home, recommend the following: A little help with walking and/or transfers;A little help with bathing/dressing/bathroom;Assistance with cooking/housework;Assist for transportation;Help with stairs or ramp for entrance   Can travel by private vehicle   Yes    Equipment Recommendations BSC/3in1  Recommendations for Other Services  Rehab consult    Functional Status Assessment Patient has had a recent decline in their functional status and demonstrates the ability to make significant improvements in function in a reasonable and predictable amount of time.     Precautions / Restrictions Precautions Precautions: Fall;Back Restrictions Weight Bearing Restrictions Per Provider Order: No      Mobility  Bed Mobility Overal bed mobility: Needs Assistance Bed Mobility: Rolling, Sidelying to Sit Rolling: Min assist, +2 for physical assistance, Used rails Sidelying to sit: Min assist, +2 for physical assistance, Used rails (exit R side of bed)       General bed mobility comments:  education re: log rolling    Transfers Overall transfer level: Needs assistance Equipment used: Rolling walker (2 wheels) Transfers: Sit to/from Stand, Bed to chair/wheelchair/BSC Sit to Stand: Min assist, +2 physical assistance, +2 safety/equipment   Step pivot transfers: Min assist, +2 physical assistance, +2 safety/equipment       General transfer comment: education re: hand placement    Ambulation/Gait Ambulation/Gait assistance:  (pt defers 2/2 pain)                Stairs            Wheelchair Mobility     Tilt Bed    Modified Rankin (Stroke Patients Only)       Balance Overall balance assessment: Needs assistance, History of Falls Sitting-balance support: Feet supported Sitting balance-Leahy Scale: Fair     Standing balance support: During functional activity, Bilateral upper extremity supported, Reliant on assistive device for balance Standing balance-Leahy Scale: Fair                               Pertinent Vitals/Pain Pain Assessment Pain Assessment: 0-10 Pain Score: 6  Pain Location: back Pain Descriptors / Indicators: Discomfort Pain Intervention(s): Monitored during session, Limited activity within patient's tolerance, Repositioned    Home Living Family/patient expects to be discharged to:: Private residence Living Arrangements: Alone Available Help at Discharge: Friend(s);Family;Available PRN/intermittently Type of Home: Apartment Home Access: Elevator       Home Layout: One level Home Equipment: Agricultural Consultant (2 wheels);Rollator (4 wheels);Cane - single point;Tub bench;Grab bars - tub/shower;Grab bars - toilet;Hand held shower head;Other (comment);Lift  chair Additional Comments: is paying for life alert but lost necklace so has to get a new one    Prior Function               Mobility Comments: at least 3 falls in the past week, 3 prior to this, ambulatory with RW vs rollator ADLs Comments: independent with  bathing & dressing, wears briefs     Extremity/Trunk Assessment   Upper Extremity Assessment Upper Extremity Assessment: Overall WFL for tasks assessed    Lower Extremity Assessment Lower Extremity Assessment: Overall WFL for tasks assessed;Generalized weakness       Communication   Communication Communication: No apparent difficulties    Cognition Arousal: Alert Behavior During Therapy: WFL for tasks assessed/performed   PT - Cognitive impairments: No apparent impairments                         Following commands: Intact       Cueing Cueing Techniques: Verbal cues     General Comments General comments (skin integrity, edema, etc.): Educated pt on back precautions.    Exercises     Assessment/Plan    PT Assessment Patient needs continued PT services  PT Problem List Decreased strength;Cardiopulmonary status limiting activity;Pain;Decreased range of motion;Impaired sensation;Decreased activity tolerance;Decreased knowledge of use of DME;Decreased balance;Decreased safety awareness;Decreased mobility;Decreased knowledge of precautions       PT Treatment Interventions DME instruction;Therapeutic exercise;Balance training;Neuromuscular re-education;Gait training;Stair training;Functional mobility training;Therapeutic activities;Patient/family education;Modalities    PT Goals (Current goals can be found in the Care Plan section)  Acute Rehab PT Goals Patient Stated Goal: decreased pain, go home PT Goal Formulation: With patient/family Time For Goal Achievement: 11/02/24 Potential to Achieve Goals: Good    Frequency 7X/week     Co-evaluation PT/OT/SLP Co-Evaluation/Treatment: Yes Reason for Co-Treatment: Other (comment);To address functional/ADL transfers (unsure of pt's pain level) PT goals addressed during session: Balance;Mobility/safety with mobility;Proper use of DME         AM-PAC PT 6 Clicks Mobility  Outcome Measure Help needed turning  from your back to your side while in a flat bed without using bedrails?: A Little Help needed moving from lying on your back to sitting on the side of a flat bed without using bedrails?: A Little Help needed moving to and from a bed to a chair (including a wheelchair)?: A Little Help needed standing up from a chair using your arms (e.g., wheelchair or bedside chair)?: A Little Help needed to walk in hospital room?: A Lot Help needed climbing 3-5 steps with a railing? : Total 6 Click Score: 15    End of Session   Activity Tolerance: Patient limited by pain;Patient limited by fatigue Patient left: in chair;with chair alarm set;with call bell/phone within reach Nurse Communication: Mobility status PT Visit Diagnosis: History of falling (Z91.81);Other abnormalities of gait and mobility (R26.89);Difficulty in walking, not elsewhere classified (R26.2);Muscle weakness (generalized) (M62.81);Pain Pain - part of body:  (back)    Time: 1102-1130 PT Time Calculation (min) (ACUTE ONLY): 28 min   Charges:   PT Evaluation $PT Eval Low Complexity: 1 Low   PT General Charges $$ ACUTE PT VISIT: 1 Visit         Richerd Pinal, PT, DPT 10/19/24, 12:26 PM   Richerd CHRISTELLA Pinal 10/19/2024, 12:25 PM

## 2024-10-19 NOTE — Hospital Course (Signed)
 SABRA

## 2024-10-20 DIAGNOSIS — M549 Dorsalgia, unspecified: Secondary | ICD-10-CM | POA: Diagnosis not present

## 2024-10-20 NOTE — Care Management Obs Status (Signed)
 MEDICARE OBSERVATION STATUS NOTIFICATION   Patient Details  Name: James [name] MRN: [id number] Date of Birth: [birth year]   Medicare Observation Status Notification Given:  Yes    Maryn Freelove W, CMA 10/20/2024, 2:28 PM

## 2024-10-20 NOTE — Plan of Care (Signed)

## 2024-10-20 NOTE — NC FL2 (Signed)
 " [place]  MEDICAID FL2 LEVEL OF CARE FORM     IDENTIFICATION  Patient Name: James [name] Birthdate: Oct 29, 1935 Sex: male Admission Date (Current Location): 10/18/2024  [facility] and [place] Number:  Chiropodist and Address:         Provider Number: [id number]  Attending Physician Name and Address:  Jens Durand, MD  Relative Name and Phone Number:       Current Level of Care: Hospital Recommended Level of Care: Skilled Nursing Facility Prior Approval Number:    Date Approved/Denied:   PASRR Number: [id number]  Discharge Plan: SNF    Current Diagnoses: Patient Active Problem List   Diagnosis Date Noted   Weakness of both lower extremities 10/18/2024   Accidental fall 10/18/2024   Intractable back pain 10/18/2024   Acute L2 vertebral fracture (HCC) 10/18/2024   Frequent falls 10/18/2024   Abnormality of gait due to impairment of balance 10/18/2024   Hypertensive urgency 10/18/2024   Coronary artery disease    Flu vaccine need 06/30/2024   Acute pain of left knee 05/05/2024   Bilateral impacted cerumen 11/16/2023   Bilateral hearing loss 11/16/2023   Gastroesophageal reflux disease without esophagitis 05/07/2023   GAD (generalized anxiety disorder) 02/08/2023   Unsteady gait when walking 11/09/2022   BPH with obstruction/lower urinary tract symptoms 03/21/2020   Nephrolithiasis 03/21/2020   Bradycardia, sinus 03/16/2019   Dizziness 03/16/2019   Essential hypertension, benign 03/16/2019   Renal cyst 06/24/2018   Hiatal hernia 06/13/2018   Coronary atherosclerosis of autologous vein bypass graft 06/22/2017   Vitamin B12 deficiency 12/30/2016   Vitamin D deficiency 12/28/2016   Family history of malignant neoplasm of other organs or systems 12/08/2016   Obstructive sleep apnea (adult) (pediatric) 01/01/2016   Overweight 12/16/2015   Diverticulosis of colon 11/01/2015   Internal hemorrhoid 11/01/2015   Senile ectropion of left lower eyelid  06/28/2014   Cherry angioma 02/22/2014   Lentigo 02/22/2014   Actinic keratosis 02/22/2014   Not current smoker 05/23/2013   H/O pilonidal cyst 05/23/2013   Hardening of the aorta (main artery of the heart) 07/17/2012   Anxiety 06/15/2012   COPD (chronic obstructive pulmonary disease) (HCC) 06/15/2012   Neck mass 10/17/2011   Anemia 08/06/2011   Closed fracture of part of upper end of humerus 08/03/2011   S/P CABG x 2 01/22/2011   Hyperlipidemia 01/21/2011   Osteopenia 05/01/2010   Dyspepsia 03/08/2010   Colon polyp 05/09/2009   Tobacco abuse counseling 11/11/2007    Orientation RESPIRATION BLADDER Height & Weight     Self, Time, Situation, Place  Normal Continent Weight: 176 lb 2.4 oz (79.9 kg) Height:  5' 9 (175.3 cm)  BEHAVIORAL SYMPTOMS/MOOD NEUROLOGICAL BOWEL NUTRITION STATUS      Continent Diet (Heart)  AMBULATORY STATUS COMMUNICATION OF NEEDS Skin   Limited Assist Verbally Normal                       Personal Care Assistance Level of Assistance  Bathing, Dressing, Feeding Bathing Assistance: Limited assistance Feeding assistance: Independent Dressing Assistance: Limited assistance     Functional Limitations Info  Sight, Hearing, Speech Sight Info: Impaired Hearing Info: Adequate Speech Info: Adequate    SPECIAL CARE FACTORS FREQUENCY  PT (By licensed PT), OT (By licensed OT)     PT Frequency: 5x/week OT Frequency: 5x/week            Contractures      Additional Factors Info  Code Status,  Allergies Code Status Info: Full Allergies Info: NKA           Current Medications (10/20/2024):  This is the current hospital active medication list Current Facility-Administered Medications  Medication Dose Route Frequency Provider Last Rate Last Admin   acetaminophen  (TYLENOL ) tablet 650 mg  650 mg Oral Q6H PRN Duncan, Hazel V, MD       Or   acetaminophen  (TYLENOL ) suppository 650 mg  650 mg Rectal Q6H PRN Cleatus Delayne GAILS, MD       celecoxib   (CELEBREX ) capsule 200 mg  200 mg Oral BID Duncan, Hazel V, MD   200 mg at 10/20/24 0935   enalapril  (VASOTEC ) tablet 20 mg  20 mg Oral Daily Duncan, Hazel V, MD   20 mg at 10/19/24 0940   hydrALAZINE  (APRESOLINE ) injection 5 mg  5 mg Intravenous Q6H PRN Jens Durand, MD       lidocaine  (LIDODERM ) 5 % 1 patch  1 patch Transdermal Q24H Ernest Ronal BRAVO, MD   1 patch at 10/19/24 1414   methocarbamol  (ROBAXIN ) injection 500 mg  500 mg Intravenous Q6H PRN Cleatus Delayne GAILS, MD       morphine  (PF) 2 MG/ML injection 2 mg  2 mg Intravenous Q2H PRN Duncan, Hazel V, MD       ondansetron  (ZOFRAN ) tablet 4 mg  4 mg Oral Q6H PRN Duncan, Hazel V, MD       Or   ondansetron  (ZOFRAN ) injection 4 mg  4 mg Intravenous Q6H PRN Duncan, Hazel V, MD   4 mg at 10/19/24 1045   oxyCODONE  (Oxy IR/ROXICODONE ) immediate release tablet 5 mg  5 mg Oral Q4H PRN Duncan, Hazel V, MD   5 mg at 10/20/24 0936   pantoprazole  (PROTONIX ) EC tablet 40 mg  40 mg Oral Daily Duncan, Hazel V, MD   40 mg at 10/20/24 9063   rosuvastatin  (CRESTOR ) tablet 10 mg  10 mg Oral Daily Duncan, Hazel V, MD   10 mg at 10/20/24 9063     Discharge Medications: Please see discharge summary for a list of discharge medications.  Relevant Imaging Results:  Relevant Lab Results:   Additional Information SSN: [id number]  Alvaro Louder, LCSW     "

## 2024-10-20 NOTE — Progress Notes (Signed)
 Physical Therapy Treatment Patient Details Name: James [name] MRN: [id number] DOB: 1935-12-15 Today's Date: 10/20/2024   History of Present Illness Pt is an 89 y/o M admitted on 10/18/24 after presenting with c/o falls. Pt found to have acute L2 vertebral fx. PMH: CAD s/p CABG, COPD, CKD, HTN, ambulatory dysfunction, anxiety    PT Comments  Pt received up in chair, family at bedside. TLSO finally at bedside after additional steps taken to receive. Discussed and educated pt and family on back precautions, log roll technique, benefits and donning of TLSO. Will continue to educate. Overall good progress with functional mobility. Increased gait distance with RW, TLSO, CGA ~94ft. Pt pre-medicated, no increased pain in standing or c/o radiating pain. Pt assisted back to bed with MinA and positioned to comfort with all needs met. Will continue to recommend STR once cleared for d/c.    If plan is discharge home, recommend the following: A little help with walking and/or transfers;A little help with bathing/dressing/bathroom;Assistance with cooking/housework;Assist for transportation;Help with stairs or ramp for entrance   Can travel by private vehicle     Yes  Equipment Recommendations  BSC/3in1    Recommendations for Other Services       Precautions / Restrictions Precautions Precautions: Fall;Back Recall of Precautions/Restrictions: Intact Precaution/Restrictions Comments:  (Pt and daughter educated on log rolling, back precautions, and use of TLSO) Required Braces or Orthoses: Spinal Brace (TLSO) Spinal Brace: Thoracolumbosacral orthotic;Applied in sitting position Restrictions Weight Bearing Restrictions Per Provider Order: No     Mobility  Bed Mobility Overal bed mobility: Needs Assistance Bed Mobility: Sit to Sidelying, Rolling Rolling: Min assist       Sit to sidelying: Min assist General bed mobility comments: education re: log rolling    Transfers Overall transfer  level: Needs assistance Equipment used: Rolling walker (2 wheels) Transfers: Sit to/from Stand Sit to Stand: Min assist           General transfer comment: education re: hand placement, body mechanics for STS    Ambulation/Gait Ambulation/Gait assistance: Contact guard assist Gait Distance (Feet): 85 Feet Assistive device: Rolling walker (2 wheels) Gait Pattern/deviations: Step-through pattern, Decreased stride length Gait velocity: decr     General Gait Details:  (TLSO applied in standing since pt received in Recliner. No LOB during gait or c/o significant pain/radiation)   Stairs             Wheelchair Mobility     Tilt Bed    Modified Rankin (Stroke Patients Only)       Balance Overall balance assessment: Needs assistance, History of Falls Sitting-balance support: Feet supported Sitting balance-Leahy Scale: Fair Sitting balance - Comments: steady static sitting reaching inside BOS.   Standing balance support: During functional activity, Bilateral upper extremity supported, Reliant on assistive device for balance Standing balance-Leahy Scale: Fair Standing balance comment:  (Reliant on RW for balance)                            Communication Communication Communication: No apparent difficulties  Cognition Arousal: Alert Behavior During Therapy: WFL for tasks assessed/performed   PT - Cognitive impairments: No apparent impairments                       PT - Cognition Comments: Very pleasant and cooperative Following commands: Intact      Cueing Cueing Techniques: Verbal cues  Exercises      General Comments  General comments (skin integrity, edema, etc.):  (TLSO arrived this pm, initial adjustments made for proper fit. Educated on donning which will need to continue)      Pertinent Vitals/Pain Pain Assessment Pain Assessment: Faces Faces Pain Scale: Hurts a little bit Pain Location: back Pain Descriptors / Indicators:  Discomfort Pain Intervention(s): Monitored during session, Premedicated before session    Home Living                          Prior Function            PT Goals (current goals can now be found in the care plan section) Acute Rehab PT Goals Patient Stated Goal: decreased pain, go home Progress towards PT goals: Progressing toward goals    Frequency    7X/week      PT Plan      Co-evaluation              AM-PAC PT 6 Clicks Mobility   Outcome Measure  Help needed turning from your back to your side while in a flat bed without using bedrails?: A Little Help needed moving from lying on your back to sitting on the side of a flat bed without using bedrails?: A Little Help needed moving to and from a bed to a chair (including a wheelchair)?: A Little Help needed standing up from a chair using your arms (e.g., wheelchair or bedside chair)?: A Little Help needed to walk in hospital room?: A Lot Help needed climbing 3-5 steps with a railing? : Total 6 Click Score: 15    End of Session Equipment Utilized During Treatment: Gait belt;Other (comment) (TLSO) Activity Tolerance: Patient tolerated treatment well Patient left: in bed;with call bell/phone within reach;with bed alarm set Nurse Communication: Mobility status PT Visit Diagnosis: History of falling (Z91.81);Other abnormalities of gait and mobility (R26.89);Difficulty in walking, not elsewhere classified (R26.2);Muscle weakness (generalized) (M62.81);Pain Pain - part of body:  (Back)     Time: 8555-8484 PT Time Calculation (min) (ACUTE ONLY): 31 min  Charges:    $Gait Training: 8-22 mins $Therapeutic Activity: 8-22 mins PT General Charges $$ ACUTE PT VISIT: 1 Visit                    Darice Bohr, PTA  Darice JAYSON Bohr 10/20/2024, 4:08 PM

## 2024-10-20 NOTE — TOC Initial Note (Signed)
 Transition of Care [facility]) - Initial/Assessment Note    Patient Details  Name: James [name] MRN: [id number] Date of Birth: [birth year]  [facility] CM/SW Contact:    James [name] Phone Number: 10/20/2024, 11:36 AM  Clinical Narrative:     Per chart review patient is from home. PCP is James [name] faxed out patient to [facility] in [place]. TOC to present facilities to patient at the bedside.                    Patient Goals and CMS Choice            Expected Discharge Plan and Services                                              Prior Living Arrangements/Services                       Activities of Daily Living   ADL Screening (condition at time of admission) Independently performs ADLs?: No Does the patient have a NEW difficulty with bathing/dressing/toileting/self-feeding that is expected to last >3 days?: Yes (Initiates electronic notice to provider for possible OT consult) Does the patient have a NEW difficulty with getting in/out of bed, walking, or climbing stairs that is expected to last >3 days?: Yes (Initiates electronic notice to provider for possible PT consult) Does the patient have a NEW difficulty with communication that is expected to last >3 days?: No Is the patient deaf or have difficulty hearing?: Yes Does the patient have difficulty seeing, even when wearing glasses/contacts?: Yes Does the patient have difficulty concentrating, remembering, or making decisions?: No  Permission Sought/Granted                  Emotional Assessment              Admission diagnosis:  Frequent falls [R29.6] Intractable back pain [M54.9] Fall, initial encounter [W19.XXXA] Closed compression fracture of L2 vertebra, initial encounter (HCC) [S32.020A] Patient Active Problem List   Diagnosis Date Noted   Weakness of both lower extremities 10/18/2024   Accidental fall 10/18/2024   Intractable back pain  10/18/2024   Acute L2 vertebral fracture (HCC) 10/18/2024   Frequent falls 10/18/2024   Abnormality of gait due to impairment of balance 10/18/2024   Hypertensive urgency 10/18/2024   Coronary artery disease    Flu vaccine need 06/30/2024   Acute pain of left knee 05/05/2024   Bilateral impacted cerumen 11/16/2023   Bilateral hearing loss 11/16/2023   Gastroesophageal reflux disease without esophagitis 05/07/2023   GAD (generalized anxiety disorder) 02/08/2023   Unsteady gait when walking 11/09/2022   BPH with obstruction/lower urinary tract symptoms 03/21/2020   Nephrolithiasis 03/21/2020   Bradycardia, sinus 03/16/2019   Dizziness 03/16/2019   Essential hypertension, benign 03/16/2019   Renal cyst 06/24/2018   Hiatal hernia 06/13/2018   Coronary atherosclerosis of autologous vein bypass graft 06/22/2017   Vitamin B12 deficiency 12/30/2016   Vitamin D deficiency 12/28/2016   Family history of malignant neoplasm of other organs or systems 12/08/2016   Obstructive sleep apnea (adult) (pediatric) 01/01/2016   Overweight 12/16/2015   Diverticulosis of colon 11/01/2015   Internal hemorrhoid 11/01/2015   Senile ectropion of left lower eyelid 06/28/2014   Cherry angioma 02/22/2014   Lentigo 02/22/2014   Actinic  keratosis 02/22/2014   Not current smoker 05/23/2013   H/O pilonidal cyst 05/23/2013   Hardening of the aorta (main artery of the heart) 07/17/2012   Anxiety 06/15/2012   COPD (chronic obstructive pulmonary disease) (HCC) 06/15/2012   Neck mass 10/17/2011   Anemia 08/06/2011   Closed fracture of part of upper end of humerus 08/03/2011   S/P CABG x 2 01/22/2011   Hyperlipidemia 01/21/2011   Osteopenia 05/01/2010   Dyspepsia 03/08/2010   Colon polyp 05/09/2009   Tobacco abuse counseling 11/11/2007   PCP:  James [name] Pharmacy:   [facility] [phone] James [name],  - [address] [place] [address] Phone: [phone] Fax:  [phone]     Social Drivers of Health (SDOH) Social History: SDOH Screenings   Food Insecurity: No Food Insecurity (10/18/2024)  Housing: Low Risk (10/18/2024)  Recent Concern: Housing - High Risk (07/21/2024)   Received from [facility]  Transportation Needs: No Transportation Needs (10/18/2024)  Utilities: Not At Risk (10/18/2024)  Alcohol Screen: Low Risk (08/16/2023)  Depression (PHQ2-9): Medium Risk (08/16/2023)  Financial Resource Strain: Low Risk  (08/22/2024)   Received from [facility]  Physical Activity: Sufficiently Active (08/16/2023)  Social Connections: Unknown (10/18/2024)  Tobacco Use: Medium Risk (10/18/2024)  Health Literacy: Adequate Health Literacy (08/16/2023)   SDOH Interventions:     Readmission Risk Interventions     No data to display

## 2024-10-21 DIAGNOSIS — M549 Dorsalgia, unspecified: Secondary | ICD-10-CM | POA: Diagnosis not present

## 2024-10-21 MED ORDER — POLYETHYLENE GLYCOL 3350 17 G PO PACK
17.0000 g | PACK | Freq: Every day | ORAL | Status: AC | PRN
  Administered 2024-10-21: 17 g via ORAL
  Filled 2024-10-21: qty 1

## 2024-10-21 MED ORDER — SENNOSIDES-DOCUSATE SODIUM 8.6-50 MG PO TABS
1.0000 | ORAL_TABLET | Freq: Two times a day (BID) | ORAL | Status: AC
  Administered 2024-10-21 – 2024-10-27 (×13): 1 via ORAL
  Filled 2024-10-21 (×13): qty 1

## 2024-10-21 NOTE — Progress Notes (Signed)
 Physical Therapy Treatment Patient Details Name: James [name] MRN: [id number] DOB: 1935-10-23 Today's Date: 10/21/2024   History of Present Illness Pt is an 89 y/o M admitted on 10/18/24 after presenting with c/o falls. Pt found to have acute L2 vertebral fx. PMH: CAD s/p CABG, COPD, CKD, HTN, ambulatory dysfunction, anxiety    PT Comments  Patient found supine in bed upon arrival with patient alert and oriented. Patient reports pain at 4/10 in back, but agreeable to therapy visit. Patient provided re-education on spinal precautions with patient able to verbalize understanding after education provided; however, patient unable to verbalize precautions prior to education. Patient completed bed rolling to patients R side with usage of bed rail at min a maintaining log rolling technique. Patient completed sidelying to sitting Eob at min A with verbal cuing for hand and foot placement with good carryover. Patient completed sts from elevated EOB to rolling walker with TLSO Brace donned providing cuing for reduced anterior weight shifting completing at min A with patient indicating no increase in pain noted or dizziness. Patient completed ambulation with rolling walker for 96 feet at CGA completing step through pattern requiring frequent cuing to increase stride length; however, patient maintaining short stride length with inconsistent carryover requiring further training with no overt lob and effective heel toe sequence. Patient unable to complete additional ambulation due to fatigue. Patient performed SPT from rolling walker to chair at [facility] for pivoting sequence increasing to Min A to assist for slow descent to recliner and hand placement. Patient left seated in recliner with brace donned with patient reporting pain increasing to 5 with activity with quick decrease upon sitting. Patient provided additional education for removal of pillow in chair due to increased sacral sitting and maintaining weight bearing  through ischial tuberosities to promote effective spinal alignment with patient reporting improved comfort. Patient left seated with call bell/phone, and remote in reach with nurse present.    If plan is discharge home, recommend the following: A little help with walking and/or transfers;A little help with bathing/dressing/bathroom;Assistance with cooking/housework;Assist for transportation;Help with stairs or ramp for entrance   Can travel by private vehicle        Equipment Recommendations  BSC/3in1    Recommendations for Other Services Rehab consult     Precautions / Restrictions Precautions Precautions: Fall;Back Recall of Precautions/Restrictions: Impaired Precaution/Restrictions Comments:  (Spinal precautions) Required Braces or Orthoses: Spinal Brace Spinal Brace: Thoracolumbosacral orthotic;Applied in sitting position;Applied in standing position Restrictions Weight Bearing Restrictions Per Provider Order: No     Mobility  Bed Mobility Overal bed mobility: Needs Assistance Bed Mobility: Sit to Sidelying, Rolling Rolling: Min assist Sidelying to sit: Min assist     Sit to sidelying: Min assist General bed mobility comments:  (Education on Log rolling and sequencing to sidelying to EOB with hand positioning to maintain spinal precautions)    Transfers Overall transfer level: Needs assistance Equipment used: Rolling walker (2 wheels) Transfers: Sit to/from Stand Sit to Stand: Min assist   Step pivot transfers: Min assist (physical assistance for guiding body to chair upon descending)       General transfer comment: education re: hand placement, body mechanics for STS emphasizing maitaining spinal precautions    Ambulation/Gait Ambulation/Gait assistance: Contact guard assist Gait Distance (Feet): 96 Feet Assistive device: Rolling walker (2 wheels) Gait Pattern/deviations: Step-through pattern, Decreased stride length Gait velocity: decreased          Stairs  Wheelchair Mobility     Tilt Bed    Modified Rankin (Stroke Patients Only)       Balance Overall balance assessment: Needs assistance, History of Falls Sitting-balance support: Feet supported Sitting balance-Leahy Scale: Good Sitting balance - Comments:  (Steady static sitting and able to maintain BOS when scooting glutes to EOB)   Standing balance support: During functional activity, Bilateral upper extremity supported, Reliant on assistive device for balance Standing balance-Leahy Scale: Fair                              Hotel Manager: No apparent difficulties  Cognition Arousal: Alert Behavior During Therapy: WFL for tasks assessed/performed   PT - Cognitive impairments: No apparent impairments                       PT - Cognition Comments: Very pleasant and cooperative Following commands: Intact      Cueing Cueing Techniques: Verbal cues, Visual cues  Exercises      General Comments        Pertinent Vitals/Pain Pain Assessment Pain Assessment: 0-10 Pain Score: 4  Pain Location: back Pain Descriptors / Indicators: Discomfort Pain Intervention(s): Monitored during session    Home Living                          Prior Function            PT Goals (current goals can now be found in the care plan section) Acute Rehab PT Goals Patient Stated Goal: decreased pain, go home PT Goal Formulation: With patient/family Time For Goal Achievement: 11/02/24 Potential to Achieve Goals: Good Progress towards PT goals: Progressing toward goals    Frequency    7X/week      PT Plan      Co-evaluation     PT goals addressed during session: Balance;Mobility/safety with mobility;Proper use of DME        AM-PAC PT 6 Clicks Mobility   Outcome Measure  Help needed turning from your back to your side while in a flat bed without using bedrails?: A Little Help  needed moving from lying on your back to sitting on the side of a flat bed without using bedrails?: A Little Help needed moving to and from a bed to a chair (including a wheelchair)?: A Little Help needed standing up from a chair using your arms (e.g., wheelchair or bedside chair)?: A Little Help needed to walk in hospital room?: A Little   6 Click Score: 15    End of Session Equipment Utilized During Treatment: Gait belt;Back brace Activity Tolerance: Patient tolerated treatment well Patient left: in chair;with call bell/phone within reach;with nursing/sitter in room Nurse Communication: Mobility status PT Visit Diagnosis: History of falling (Z91.81);Other abnormalities of gait and mobility (R26.89);Difficulty in walking, not elsewhere classified (R26.2);Muscle weakness (generalized) (M62.81);Pain     Time: 9086-9060 PT Time Calculation (min) (ACUTE ONLY): 26 min  Charges:    $Gait Training: 8-22 mins $Therapeutic Activity: 8-22 mins PT General Charges $$ ACUTE PT VISIT: 1 Visit                     Waddell Lesches, PTA

## 2024-10-21 NOTE — Progress Notes (Signed)
 "    Progress Note    James [name]  FMW:[id number] DOB: 03/28/1936  DOA: 10/18/2024 PCP: [name], MD      Brief Narrative:    Medical records reviewed and are as summarized below:  James [name] is a 89 y.o. male  with medical history significant for CAD s/p CABG, COPD, CKD, HTN, ambulatory dysfunction, who presented to the hospital because of low back pain, general weakness and recurrent falls at home.  He is falling about twice in 3 days.  Reportedly, he struck his head once.  Of note, patient had been previously evaluated in the past for tremors, shuffling gait, falls and cognitive dysfunction in October 2024.  Previous MRI brain did not show any acute abnormality.  Vitals in the ED significant for BP up to 202/103.   CT spine was concerning for acute L2 fracture.      Assessment/Plan:   Principal Problem:   Intractable back pain Active Problems:   Accidental fall   Acute L2 vertebral fracture (HCC)   Weakness of both lower extremities   Frequent falls   Abnormality of gait due to impairment of balance   Hypertensive urgency   BPH with obstruction/lower urinary tract symptoms   S/P CABG x 2   Coronary artery disease   COPD (chronic obstructive pulmonary disease) (HCC)   Body mass index is 26.01 kg/m.   Intractable low back pain, suspected acute L2 vertebral fracture: MRI lumbar spine showed partially imaged acute/recent bilateral sacral insufficiency fractures, possibly remote L2 fracture.  Suspected L4-L5 disc edema and endplate marrow edema suspicious for degenerative changes, moderate foraminal stenosis bilaterally at L5-S1 and on the left at L4-L5. Continue analgesics for pain, continue p.o. oxycodone  -pain controlled, discontinue IV morphine  PT and OT recommended discharge to SNF. TLSO brace when out of bed He was evaluated by IR.  No surgical intervention required because L2 fracture thought to be a remote fracture.   Hypertensive  urgency: BP is better.  Continue enalapril .    Constipation: Add bowel regimen   CAD with history of CABG: Continue statin   Comorbidities include COPD, BPH.   Diet Order             Diet Heart Fluid consistency: Thin  Diet effective now                    Consultants: Interventional radiologist  Procedures: None    Medications:    celecoxib   200 mg Oral BID   enalapril   20 mg Oral Daily   lidocaine   1 patch Transdermal Q24H   pantoprazole   40 mg Oral Daily   rosuvastatin   10 mg Oral Daily   Continuous Infusions:   Anti-infectives (From admission, onward)    None        Family Communication/Anticipated D/C date and plan/Code Status   DVT prophylaxis: SCDs Start: 10/18/24 1935     Code Status: Full Code  Family Communication: None  Disposition Plan: Plan to discharge to SNF   Status is: Observation The patient will require care spanning > 2 midnights and should be moved to inpatient because: Acute L2 fracture       Subjective:   Interval events noted.  He still has some low back pain but he feels better today. Asked RN to help patient with mobility if possible Pending SNF placement, TOC aware  Objective:    Vitals:   10/20/24 1450 10/20/24 2011 10/21/24 0357 10/21/24 0454  BP: 135/75 ROLLEN)  155/77 (!) 176/106 (!) 154/86  Pulse: 73 61 65 (!) 54  Resp: 18 18 18    Temp: (!) 97.4 F (36.3 C) (!) 97.5 F (36.4 C) (!) 97.3 F (36.3 C)   TempSrc:      SpO2: 98% 99% 95%   Weight:      Height:       No data found.   Intake/Output Summary (Last 24 hours) at 10/21/2024 0846 Last data filed at 10/21/2024 0500 Gross per 24 hour  Intake --  Output 300 ml  Net -300 ml    Filed Weights   10/18/24 1204 10/18/24 2118  Weight: 80.7 kg 79.9 kg    Exam:  GEN: NAD, sitting up in the chair SKIN: Warm and dry EYES: No pallor or icterus ENT: MMM CV: RRR PULM: CTA B ABD: soft, ND, NT, +BS CNS: AAO x 3, non focal EXT: No edema or  tenderness MSK: Mild sacral spinal tenderness       Data Reviewed:   I have personally reviewed following labs and imaging studies:  Labs: Labs show the following:   Basic Metabolic Panel: Recent Labs  Lab 10/18/24 1208  NA 142  K 3.7  CL 107  CO2 24  GLUCOSE 128*  BUN 20  CREATININE 0.89  CALCIUM  9.8   GFR Estimated Creatinine Clearance: 59.6 mL/min (by C-G formula based on SCr of 0.89 mg/dL). Liver Function Tests: Recent Labs  Lab 10/18/24 1208  AST 34  ALT 15  ALKPHOS 180*  BILITOT 0.9  PROT 7.2  ALBUMIN 4.3   No results for input(s): LIPASE, AMYLASE in the last 168 hours. No results for input(s): AMMONIA in the last 168 hours. Coagulation profile No results for input(s): INR, PROTIME in the last 168 hours.  CBC: Recent Labs  Lab 10/18/24 1208  WBC 6.9  HGB 13.7  HCT 39.5  MCV 93.2  PLT 194   Cardiac Enzymes: Recent Labs  Lab 10/18/24 1208  CKTOTAL 405*   BNP (last 3 results) No results for input(s): PROBNP in the last 8760 hours. CBG: Recent Labs  Lab 10/18/24 1342  GLUCAP 111*   D-Dimer: No results for input(s): DDIMER in the last 72 hours. Hgb A1c: No results for input(s): HGBA1C in the last 72 hours. Lipid Profile: No results for input(s): CHOL, HDL, LDLCALC, TRIG, CHOLHDL, LDLDIRECT in the last 72 hours. Thyroid function studies: No results for input(s): TSH, T4TOTAL, T3FREE, THYROIDAB in the last 72 hours.  Invalid input(s): FREET3 Anemia work up: No results for input(s): VITAMINB12, FOLATE, FERRITIN, TIBC, IRON, RETICCTPCT in the last 72 hours. Sepsis Labs: Recent Labs  Lab 10/18/24 1208  WBC 6.9    Microbiology No results found for this or any previous visit (from the past 240 hours).  Procedures and diagnostic studies:  DG Foot Complete Right Result Date: 10/20/2024 Please see detailed radiograph report in office note.  MR LUMBAR SPINE WO CONTRAST Result Date:  10/19/2024 EXAM: MRI LUMBAR SPINE 10/19/2024 10:16:55 AM TECHNIQUE: Multiplanar multisequence MRI of the lumbar spine was performed without the administration of intravenous contrast. COMPARISON: CT Lumbar spine today. CLINICAL HISTORY: Compression fracture, lumbar Lumbar compression fracture. FINDINGS: BONES AND ALIGNMENT: Normal alignment. The L2 inferior endplate deformity seen on same day CT has no marrow edema and is favored remote. Partially imaged acute/recent bilateral sacral insufficiency fractures. Mild edema in the inferior L4 and superior L5 endplates. SPINAL CORD: The conus terminates normally. SOFT TISSUES: No paraspinal mass or edema. L1-L2: Mild right eccentric disc  bulge and endplate spurring. No spinal canal stenosis. Mild right foraminal stenosis. L2-L3: Mild disc bulge and endplate spurring. No spinal canal stenosis or neural foraminal narrowing. L3-L4: Mild disc bulge and endplate spurring. No spinal canal stenosis or neural foraminal narrowing. L4-L5: Disc edema. Disc bulge and endplate spurring. Bilateral facet arthropathy. Moderate left and mild right foraminal stenosis. Patent canal. L5-S1: Disc edema. Disc bulge and endplate spurring. Bilateral facet arthropathy. Moderate bilateral foraminal stenosis. Patent canal. IMPRESSION: 1. Partially imaged acute/recent bilateral sacral insufficiency fractures. MRI pelvis could fully characterize if clinically warranted. 2. L4-L5 disc edema and mild adjacent L4 and L5 endplate marrow edema, primarily suspicious for degenerative change and/or subtle endplate fractures given known trauma. Discitis is thought less likely given no surrounding paraspinal edema, but recommend correlation with infectious/inflammatory markers. 3. The L2 inferior endplate deformity seen on same day CT has no marrow edema and is favored remote. 4. Moderate foraminal stenosis bilaterally at L5-S1 and on the left at L4-L5. Electronically signed by: Glendia Molt MD 10/19/2024 11:40  AM EST RP Workstation: [id number]      LOS: 0 days   Total time spent: 36 minutes  Eiliana Drone  [employer] on [email or web address]. If 7PM-7AM, please contact night-coverage at [email or web address]     10/21/2024, 8:46 AM           "

## 2024-10-22 NOTE — Plan of Care (Signed)
  Problem: Education: Goal: Knowledge of General Education information will improve Description: Including pain rating scale, medication(s)/side effects and non-pharmacologic comfort measures Outcome: Progressing   Problem: Clinical Measurements: Goal: Diagnostic test results will improve Outcome: Progressing   Problem: Activity: Goal: Risk for activity intolerance will decrease Outcome: Progressing   Problem: Coping: Goal: Level of anxiety will decrease Outcome: Progressing

## 2024-10-22 NOTE — Progress Notes (Signed)
 "    Progress Note    James [name]  FMW:[id number] DOB: 02-14-1936  DOA: 10/18/2024 PCP: [name], MD      Brief Narrative:    Medical records reviewed and are as summarized below:  James [name] is a 89 y.o. male  with medical history significant for CAD s/p CABG, COPD, CKD, HTN, ambulatory dysfunction, who presented to the hospital because of low back pain, general weakness and recurrent falls at home.  He is falling about twice in 3 days.  Reportedly, he struck his head once.  Of note, patient had been previously evaluated in the past for tremors, shuffling gait, falls and cognitive dysfunction in October 2024.  Previous MRI brain did not show any acute abnormality.  Vitals in the ED significant for BP up to 202/103.   CT spine was concerning for acute L2 fracture.   Assessment/Plan:   Principal Problem:   Intractable back pain Active Problems:   Accidental fall   Acute L2 vertebral fracture (HCC)   Weakness of both lower extremities   Frequent falls   Abnormality of gait due to impairment of balance   Hypertensive urgency   BPH with obstruction/lower urinary tract symptoms   S/P CABG x 2   Coronary artery disease   COPD (chronic obstructive pulmonary disease) (HCC)   Body mass index is 26.01 kg/m.   Intractable low back pain, suspected acute L2 vertebral fracture: MRI lumbar spine showed partially imaged acute/recent bilateral sacral insufficiency fractures, possibly remote L2 fracture.  Suspected L4-L5 disc edema and endplate marrow edema suspicious for degenerative changes, moderate foraminal stenosis bilaterally at L5-S1 and on the left at L4-L5. Continue analgesics for pain, continue p.o. oxycodone  -pain controlled, discontinue IV morphine  PT and OT recommended discharge to SNF. TLSO brace when out of bed He was evaluated by IR.  No surgical intervention required because L2 fracture thought to be a remote fracture.   Hypertensive urgency: BP  is better.  Continue enalapril .     Constipation: Add bowel regimen   CAD with history of CABG: Continue statin   Comorbidities include COPD, BPH.   Diet Order             Diet Heart Fluid consistency: Thin  Diet effective now                    Consultants: Interventional radiologist  Procedures: None    Medications:    celecoxib   200 mg Oral BID   enalapril   20 mg Oral Daily   lidocaine   1 patch Transdermal Q24H   pantoprazole   40 mg Oral Daily   rosuvastatin   10 mg Oral Daily   senna-docusate  1 tablet Oral BID   Continuous Infusions:   Anti-infectives (From admission, onward)    None        Family Communication/Anticipated D/C date and plan/Code Status   DVT prophylaxis: SCDs Start: 10/18/24 1935     Code Status: Full Code  Family Communication: None  Disposition Plan: Plan to discharge to SNF   Status is: Observation The patient will require care spanning > 2 midnights and should be moved to inpatient because: Acute L2 fracture       Subjective:   Interval events noted.  He still has some low back pain but he feels better today. Asked RN to help patient with mobility if possible Pending SNF placement, TOC aware  Objective:    Vitals:   10/22/24 1035 10/22/24 1039 10/22/24  1042 10/22/24 1200  BP: (!) 168/88 (!) 193/95 (!) 171/94 (!) 140/78  Pulse: (!) 52 61 66   Resp:      Temp:      TempSrc:      SpO2: 98%     Weight:      Height:       No data found.   Intake/Output Summary (Last 24 hours) at 10/22/2024 1624 Last data filed at 10/22/2024 1300 Gross per 24 hour  Intake 480 ml  Output 700 ml  Net -220 ml    Filed Weights   10/18/24 1204 10/18/24 2118  Weight: 80.7 kg 79.9 kg    Exam:  GEN: NAD, sitting up in the chair SKIN: Warm and dry EYES: No pallor or icterus ENT: MMM CV: RRR PULM: CTA B ABD: soft, ND, NT, +BS CNS: AAO x 3, non focal EXT: No edema or tenderness MSK: Mild sacral spinal  tenderness       Data Reviewed:   I have personally reviewed following labs and imaging studies:  Labs: Labs show the following:   Basic Metabolic Panel: Recent Labs  Lab 10/18/24 1208  NA 142  K 3.7  CL 107  CO2 24  GLUCOSE 128*  BUN 20  CREATININE 0.89  CALCIUM  9.8   GFR Estimated Creatinine Clearance: 59.6 mL/min (by C-G formula based on SCr of 0.89 mg/dL). Liver Function Tests: Recent Labs  Lab 10/18/24 1208  AST 34  ALT 15  ALKPHOS 180*  BILITOT 0.9  PROT 7.2  ALBUMIN 4.3   No results for input(s): LIPASE, AMYLASE in the last 168 hours. No results for input(s): AMMONIA in the last 168 hours. Coagulation profile No results for input(s): INR, PROTIME in the last 168 hours.  CBC: Recent Labs  Lab 10/18/24 1208  WBC 6.9  HGB 13.7  HCT 39.5  MCV 93.2  PLT 194   Cardiac Enzymes: Recent Labs  Lab 10/18/24 1208  CKTOTAL 405*   BNP (last 3 results) No results for input(s): PROBNP in the last 8760 hours. CBG: Recent Labs  Lab 10/18/24 1342  GLUCAP 111*   D-Dimer: No results for input(s): DDIMER in the last 72 hours. Hgb A1c: No results for input(s): HGBA1C in the last 72 hours. Lipid Profile: No results for input(s): CHOL, HDL, LDLCALC, TRIG, CHOLHDL, LDLDIRECT in the last 72 hours. Thyroid function studies: No results for input(s): TSH, T4TOTAL, T3FREE, THYROIDAB in the last 72 hours.  Invalid input(s): FREET3 Anemia work up: No results for input(s): VITAMINB12, FOLATE, FERRITIN, TIBC, IRON, RETICCTPCT in the last 72 hours. Sepsis Labs: Recent Labs  Lab 10/18/24 1208  WBC 6.9    Microbiology No results found for this or any previous visit (from the past 240 hours).  Procedures and diagnostic studies:  No results found.     LOS: 0 days   Total time spent: 36 minutes  Zavia Pullen  [employer] on [email or web address]. If 7PM-7AM, please contact night-coverage  at [email or web address]     10/22/2024, 4:24 PM           "

## 2024-10-22 NOTE — Progress Notes (Signed)
 Physical Therapy Treatment Patient Details Name: James [name] MRN: [id number] DOB: [birth year] Today's Date: 10/22/2024   History of Present Illness Pt is an 89 y/o M admitted on 10/18/24 after presenting with c/o falls. Pt found to have acute L2 vertebral fx. PMH: CAD s/p CABG, COPD, CKD, HTN, ambulatory dysfunction, anxiety    PT Comments  Patient seen for PT session focused on functional mobility. Patient required CGA  for 110' feet and used RW. Tolerated session well with mild signs of exertion. Vitals remained stable during activity although BP elevated. Patient shows good potential to make progress with continued acute level rehab. Continued skilled PT recommended to progress toward functional goals and support discharge readiness. Pt making good progress toward goals, will continue to follow POC. Discharge recommendation remains appropriate     If plan is discharge home, recommend the following: A little help with walking and/or transfers;A little help with bathing/dressing/bathroom;Assistance with cooking/housework;Assist for transportation;Help with stairs or ramp for entrance   Can travel by private vehicle     Yes  Equipment Recommendations  BSC/3in1    Recommendations for Other Services       Precautions / Restrictions Precautions Precautions: Fall;Back Recall of Precautions/Restrictions: Impaired Required Braces or Orthoses: Spinal Brace Spinal Brace: Thoracolumbosacral orthotic;Applied in sitting position;Applied in standing position Restrictions Weight Bearing Restrictions Per Provider Order: No     Mobility  Bed Mobility Overal bed mobility: Needs Assistance Bed Mobility: Rolling, Sidelying to Sit Rolling: Min assist Sidelying to sit: Min assist            Transfers Overall transfer level: Needs assistance Equipment used: Rolling walker (2 wheels) Transfers: Sit to/from Stand Sit to Stand: Min assist           General transfer comment:  education re: hand placement, body mechanics for STS emphasizing maitaining spinal precautions    Ambulation/Gait Ambulation/Gait assistance: Contact guard assist Gait Distance (Feet): 110 Feet Assistive device: Rolling walker (2 wheels) Gait Pattern/deviations: Step-through pattern, Decreased stride length Gait velocity: decreased         Stairs             Wheelchair Mobility     Tilt Bed    Modified Rankin (Stroke Patients Only)       Balance Overall balance assessment: Needs assistance, History of Falls Sitting-balance support: Feet supported Sitting balance-Leahy Scale: Good     Standing balance support: During functional activity, Bilateral upper extremity supported, Reliant on assistive device for balance Standing balance-Leahy Scale: Fair                              Hotel Manager: No apparent difficulties  Cognition Arousal: Alert Behavior During Therapy: WFL for tasks assessed/performed   PT - Cognitive impairments: No apparent impairments                       PT - Cognition Comments: Very pleasant and cooperative Following commands: Intact      Cueing Cueing Techniques: Verbal cues, Visual cues  Exercises      General Comments        Pertinent Vitals/Pain Pain Assessment Faces Pain Scale: Hurts a little bit Pain Descriptors / Indicators: Discomfort Pain Intervention(s): Monitored during session    Home Living                          Prior Function  PT Goals (current goals can now be found in the care plan section) Acute Rehab PT Goals Patient Stated Goal: decreased pain, go home PT Goal Formulation: With patient/family Time For Goal Achievement: 11/02/24 Potential to Achieve Goals: Good Progress towards PT goals: Progressing toward goals    Frequency    7X/week      PT Plan      Co-evaluation              AM-PAC PT 6 Clicks Mobility    Outcome Measure  Help needed turning from your back to your side while in a flat bed without using bedrails?: None Help needed moving from lying on your back to sitting on the side of a flat bed without using bedrails?: None Help needed moving to and from a bed to a chair (including a wheelchair)?: A Little Help needed standing up from a chair using your arms (e.g., wheelchair or bedside chair)?: A Little Help needed to walk in hospital room?: A Little Help needed climbing 3-5 steps with a railing? : Total 6 Click Score: 18    End of Session Equipment Utilized During Treatment: Gait belt;Back brace Activity Tolerance: Patient tolerated treatment well Patient left: in chair;with call bell/phone within reach;with nursing/sitter in room Nurse Communication: Mobility status PT Visit Diagnosis: History of falling (Z91.81);Other abnormalities of gait and mobility (R26.89);Difficulty in walking, not elsewhere classified (R26.2);Muscle weakness (generalized) (M62.81);Pain     Time: 1030-1051 PT Time Calculation (min) (ACUTE ONLY): 21 min  Charges:    $Therapeutic Activity: 8-22 mins PT General Charges $$ ACUTE PT VISIT: 1 Visit                     Sherlean Lesches DPT, PT     Sherlean A Illana Nolting 10/22/2024, 10:57 AM

## 2024-10-23 LAB — CBC
HCT: 39 % (ref 39.0–52.0)
Hemoglobin: 13.5 g/dL (ref 13.0–17.0)
MCH: 31.8 pg (ref 26.0–34.0)
MCHC: 34.6 g/dL (ref 30.0–36.0)
MCV: 92 fL (ref 80.0–100.0)
Platelets: 185 10*3/uL (ref 150–400)
RBC: 4.24 MIL/uL (ref 4.22–5.81)
RDW: 12.5 % (ref 11.5–15.5)
WBC: 6.4 10*3/uL (ref 4.0–10.5)
nRBC: 0 % (ref 0.0–0.2)

## 2024-10-23 LAB — BASIC METABOLIC PANEL WITH GFR
Anion gap: 8 (ref 5–15)
BUN: 18 mg/dL (ref 8–23)
CO2: 28 mmol/L (ref 22–32)
Calcium: 9.9 mg/dL (ref 8.9–10.3)
Chloride: 106 mmol/L (ref 98–111)
Creatinine, Ser: 0.83 mg/dL (ref 0.61–1.24)
GFR, Estimated: >60 mL/min
Glucose, Bld: 91 mg/dL (ref 70–99)
Potassium: 4.9 mmol/L (ref 3.5–5.1)
Sodium: 141 mmol/L (ref 135–145)

## 2024-10-23 NOTE — Progress Notes (Signed)
 Physical Therapy Treatment Patient Details Name: James [name] MRN: [id number] DOB: [birth year] Today's Date: 10/23/2024   History of Present Illness Pt is an 89 y/o M admitted on 10/18/24 after presenting with c/o falls. Pt found to have acute L2 vertebral fx. PMH: CAD s/p CABG, COPD, CKD, HTN, ambulatory dysfunction, anxiety    PT Comments  Pt ready for session.  Pain generally controlled.  He is able to get to EOB with bed features and cues to log roll.  Light min a.  Dependant to don TLSO.  Stood with min a x 1 of clinical research associate with daughter also providing light assist.  Brace adjusted in standing and he is able to walk 100' with RW and light min a x 1.  Generally slow cautious gait with some post lean.  He does report some nausea.  Opts to remain up in recliner after session.  Reviewed HEP.     If plan is discharge home, recommend the following: A little help with walking and/or transfers;A little help with bathing/dressing/bathroom;Assistance with cooking/housework;Assist for transportation;Help with stairs or ramp for entrance   Can travel by private vehicle     Yes  Equipment Recommendations  BSC/3in1    Recommendations for Other Services       Precautions / Restrictions Precautions Precautions: Fall;Back Recall of Precautions/Restrictions: Impaired Required Braces or Orthoses: Spinal Brace Spinal Brace: Thoracolumbosacral orthotic;Applied in sitting position;Applied in standing position Restrictions Weight Bearing Restrictions Per Provider Order: No     Mobility  Bed Mobility Overal bed mobility: Needs Assistance Bed Mobility: Rolling, Sidelying to Sit Rolling: Min assist Sidelying to sit: Min assist         Patient Response: Cooperative  Transfers Overall transfer level: Needs assistance Equipment used: Rolling walker (2 wheels) Transfers: Sit to/from Stand Sit to Stand: Min assist                Ambulation/Gait Ambulation/Gait assistance: Armed Forces Technical Officer (Feet): 100 Feet Assistive device: Rolling walker (2 wheels) Gait Pattern/deviations: Step-through pattern, Decreased stride length Gait velocity: decreased         Stairs             Wheelchair Mobility     Tilt Bed Tilt Bed Patient Response: Cooperative  Modified Rankin (Stroke Patients Only)       Balance Overall balance assessment: Needs assistance, History of Falls Sitting-balance support: Feet supported Sitting balance-Leahy Scale: Good     Standing balance support: During functional activity, Bilateral upper extremity supported, Reliant on assistive device for balance Standing balance-Leahy Scale: Fair                              Hotel Manager: No apparent difficulties  Cognition Arousal: Alert Behavior During Therapy: WFL for tasks assessed/performed   PT - Cognitive impairments: No apparent impairments                         Following commands: Intact      Cueing Cueing Techniques: Verbal cues, Visual cues  Exercises      General Comments        Pertinent Vitals/Pain Pain Assessment Pain Assessment: Faces Faces Pain Scale: Hurts little more Pain Descriptors / Indicators: Discomfort, Sore Pain Intervention(s): Limited activity within patient's tolerance, Monitored during session, Repositioned    Home Living  Prior Function            PT Goals (current goals can now be found in the care plan section) Progress towards PT goals: Progressing toward goals    Frequency    7X/week      PT Plan      Co-evaluation              AM-PAC PT 6 Clicks Mobility   Outcome Measure  Help needed turning from your back to your side while in a flat bed without using bedrails?: None Help needed moving from lying on your back to sitting on the side of a flat bed without using bedrails?: None Help needed moving to and from a bed  to a chair (including a wheelchair)?: A Little Help needed standing up from a chair using your arms (e.g., wheelchair or bedside chair)?: A Little Help needed to walk in hospital room?: A Little Help needed climbing 3-5 steps with a railing? : Total 6 Click Score: 18    End of Session Equipment Utilized During Treatment: Gait belt;Back brace Activity Tolerance: Patient tolerated treatment well Patient left: in chair;with call bell/phone within reach;with nursing/sitter in room Nurse Communication: Mobility status PT Visit Diagnosis: History of falling (Z91.81);Other abnormalities of gait and mobility (R26.89);Difficulty in walking, not elsewhere classified (R26.2);Muscle weakness (generalized) (M62.81);Pain     Time: 8975-8954 PT Time Calculation (min) (ACUTE ONLY): 21 min  Charges:    $Gait Training: 8-22 mins PT General Charges $$ ACUTE PT VISIT: 1 Visit                   Lauraine Gills, PTA 10/23/24, 11:03 AM

## 2024-10-23 NOTE — Progress Notes (Signed)
 Mobility Specialist - Progress Note   10/23/24 1424  Mobility  Activity Stood with assistance;Pivoted/transferred from chair to bed  Level of Assistance Minimal assist, patient does 75% or more  Assistive Device Front wheel walker  Distance Ambulated (ft) 2 ft  Activity Response Tolerated well  Mobility visit 1 Mobility  Mobility Specialist Start Time (ACUTE ONLY) 1409  Mobility Specialist Stop Time (ACUTE ONLY) 1419  Mobility Specialist Time Calculation (min) (ACUTE ONLY) 10 min   Pt seated in the recliner upon entry, requesting to return to bed. Pt STS to RW and transferred to bed via SPT MinA-- tolerated well. Pt left supine with alarm set and needs within reach.  America Silvan Mobility Specialist 10/23/24 2:26 PM

## 2024-10-23 NOTE — Progress Notes (Addendum)
 "    Progress Note    James [name]  FMW:[id number] DOB: 1936-01-01  DOA: 10/18/2024 PCP: [name], MD      Brief Narrative:    Medical records reviewed and are as summarized below:  James [name] is a 89 y.o. male  with medical history significant for CAD s/p CABG, COPD, CKD, HTN, ambulatory dysfunction, who presented to the hospital because of low back pain, general weakness and recurrent falls at home.  He is falling about twice in 3 days.  Reportedly, he struck his head once.  Of note, patient had been previously evaluated in the past for tremors, shuffling gait, falls and cognitive dysfunction in October 2024.  Previous MRI brain did not show any acute abnormality.  Vitals in the ED significant for BP up to 202/103.   CT spine was concerning for acute L2 fracture.      Assessment/Plan:   Principal Problem:   Intractable back pain Active Problems:   Accidental fall   Acute L2 vertebral fracture (HCC)   Weakness of both lower extremities   Frequent falls   Abnormality of gait due to impairment of balance   Hypertensive urgency   BPH with obstruction/lower urinary tract symptoms   S/P CABG x 2   Coronary artery disease   COPD (chronic obstructive pulmonary disease) (HCC)   Body mass index is 26.01 kg/m.   Intractable low back pain, suspected acute L2 vertebral fracture: MRI lumbar spine showed partially imaged acute/recent bilateral sacral insufficiency fractures, possibly remote L2 fracture.  Suspected L4-L5 disc edema and endplate marrow edema suspicious for degenerative changes, moderate foraminal stenosis bilaterally at L5-S1 and on the left at L4-L5. Pain is better.  Analgesics as needed for pain. PT and OT recommended discharge to SNF. TLSO brace when out of bed He was evaluated by IR.  No surgical intervention required because L2 fracture thought to be a remote fracture. Awaiting placement to SNF.   Hypertensive urgency: BP is elevated.   He thinks BP is elevated because of multiple activities in the hospital.  He said his his BP is usually controlled at home. Continue enalapril .  No additional antihypertensives for now   CAD with history of CABG: Continue statin   Comorbidities include COPD, BPH.   Diet Order             Diet Heart Fluid consistency: Thin  Diet effective now                                  Consultants: Interventional radiologist  Procedures: None    Medications:    celecoxib   200 mg Oral BID   enalapril   20 mg Oral Daily   lidocaine   1 patch Transdermal Q24H   pantoprazole   40 mg Oral Daily   rosuvastatin   10 mg Oral Daily   senna-docusate  1 tablet Oral BID   Continuous Infusions:   Anti-infectives (From admission, onward)    None              Family Communication/Anticipated D/C date and plan/Code Status   DVT prophylaxis: SCDs Start: 10/18/24 1935     Code Status: Full Code  Family Communication: Plan discussed with Niels, daughter, at the bedside Disposition Plan: Plan to discharge to SNF   Status is: Observation The patient will require care spanning > 2 midnights and should be moved to inpatient because: Acute L2 fracture  Subjective:   Interval events noted.  He has no complaints.  He feels better.  Niels, daughter, at the bedside  Objective:    Vitals:   10/22/24 1632 10/22/24 2010 10/23/24 0232 10/23/24 0822  BP: (!) 162/88 (!) 152/69 (!) 172/96 (!) 191/91  Pulse: 60 64 65 65  Resp: 18 18 18 18   Temp: 97.6 F (36.4 C) 97.6 F (36.4 C) 97.8 F (36.6 C) 97.6 F (36.4 C)  TempSrc: Oral     SpO2: 99% 98% 97% 98%  Weight:      Height:       No data found.   Intake/Output Summary (Last 24 hours) at 10/23/2024 1557 Last data filed at 10/23/2024 1100 Gross per 24 hour  Intake 240 ml  Output 2250 ml  Net -2010 ml    Filed Weights   10/18/24 1204 10/18/24 2118  Weight: 80.7 kg 79.9 kg    Exam:  GEN:  NAD SKIN: Warm and dry EYES: No pallor or icterus ENT: MMM CV: RRR PULM: CTA B ABD: soft, ND, NT, +BS CNS: AAO x 3, non focal EXT: No edema or tenderness        Data Reviewed:   I have personally reviewed following labs and imaging studies:  Labs: Labs show the following:   Basic Metabolic Panel: Recent Labs  Lab 10/18/24 1208 10/23/24 0431  NA 142 141  K 3.7 4.9  CL 107 106  CO2 24 28  GLUCOSE 128* 91  BUN 20 18  CREATININE 0.89 0.83  CALCIUM  9.8 9.9   GFR Estimated Creatinine Clearance: 63.9 mL/min (by C-G formula based on SCr of 0.83 mg/dL). Liver Function Tests: Recent Labs  Lab 10/18/24 1208  AST 34  ALT 15  ALKPHOS 180*  BILITOT 0.9  PROT 7.2  ALBUMIN 4.3   No results for input(s): LIPASE, AMYLASE in the last 168 hours. No results for input(s): AMMONIA in the last 168 hours. Coagulation profile No results for input(s): INR, PROTIME in the last 168 hours.  CBC: Recent Labs  Lab 10/18/24 1208 10/23/24 0431  WBC 6.9 6.4  HGB 13.7 13.5  HCT 39.5 39.0  MCV 93.2 92.0  PLT 194 185   Cardiac Enzymes: Recent Labs  Lab 10/18/24 1208  CKTOTAL 405*   BNP (last 3 results) No results for input(s): PROBNP in the last 8760 hours. CBG: Recent Labs  Lab 10/18/24 1342  GLUCAP 111*   D-Dimer: No results for input(s): DDIMER in the last 72 hours. Hgb A1c: No results for input(s): HGBA1C in the last 72 hours. Lipid Profile: No results for input(s): CHOL, HDL, LDLCALC, TRIG, CHOLHDL, LDLDIRECT in the last 72 hours. Thyroid function studies: No results for input(s): TSH, T4TOTAL, T3FREE, THYROIDAB in the last 72 hours.  Invalid input(s): FREET3 Anemia work up: No results for input(s): VITAMINB12, FOLATE, FERRITIN, TIBC, IRON, RETICCTPCT in the last 72 hours. Sepsis Labs: Recent Labs  Lab 10/18/24 1208 10/23/24 0431  WBC 6.9 6.4    Microbiology No results found for this or any previous  visit (from the past 240 hours).  Procedures and diagnostic studies:  No results found.              LOS: 0 days   Mirha Brucato  [facility]   Pager on [email or web address]. If 7PM-7AM, please contact night-coverage at [email or web address]     10/23/2024, 3:57 PM           "

## 2024-10-23 NOTE — Plan of Care (Signed)

## 2024-10-23 NOTE — TOC Progression Note (Signed)
 Transition of Care [facility]) - Progression Note    Patient Details  Name: James [name] MRN: [id number] Date of Birth: 07/03/1936  Transition of Care [facility]) CM/SW Contact  Alvaro Louder, [place] Phone Number: 10/23/2024, 12:15 PM  Clinical Narrative:   [name] has been started by [employer].     Expected Discharge Plan: Skilled Nursing Facility Barriers to Discharge: Continued Medical Work up               Expected Discharge Plan and Services In-house Referral: Clinical Social Work     Living arrangements for the past 2 months: Single Family Home Expected Discharge Date: 10/22/24                                     Social Drivers of Health (SDOH) Interventions SDOH Screenings   Food Insecurity: No Food Insecurity (10/18/2024)  Housing: Low Risk (10/18/2024)  Recent Concern: Housing - High Risk (07/21/2024)   [facility]  Transportation Needs: No Transportation Needs (10/18/2024)  Utilities: Not At Risk (10/18/2024)  Alcohol Screen: Low Risk (08/16/2023)  Depression (PHQ2-9): Medium Risk (08/16/2023)  Financial Resource Strain: Low Risk  (08/22/2024)   Received from [facility]  Physical Activity: Sufficiently Active (08/16/2023)  Social Connections: Unknown (10/18/2024)  Tobacco Use: Medium Risk (10/18/2024)  Health Literacy: Adequate Health Literacy (08/16/2023)    Readmission Risk Interventions     No data to display

## 2024-10-23 NOTE — Plan of Care (Signed)
  Problem: Education: Goal: Knowledge of General Education information will improve Description: Including pain rating scale, medication(s)/side effects and non-pharmacologic comfort measures Outcome: Progressing   Problem: Clinical Measurements: Goal: Ability to maintain clinical measurements within normal limits will improve Outcome: Progressing   Problem: Clinical Measurements: Goal: Will remain free from infection Outcome: Progressing   Problem: Nutrition: Goal: Adequate nutrition will be maintained Outcome: Progressing   

## 2024-10-24 NOTE — Progress Notes (Signed)
 "    Progress Note    James [name]  FMW:[id number] DOB: 12-26-1935  DOA: 10/18/2024 PCP: [name], MD      Brief Narrative:    Medical records reviewed and are as summarized below:  James [name] is a 89 y.o. male  with medical history significant for CAD s/p CABG, COPD, CKD, HTN, ambulatory dysfunction, who presented to the hospital because of low back pain, general weakness and recurrent falls at home.  He is falling about twice in 3 days.  Reportedly, he struck his head once.  Of note, patient had been previously evaluated in the past for tremors, shuffling gait, falls and cognitive dysfunction in October 2024.  Previous MRI brain did not show any acute abnormality.  Vitals in the ED significant for BP up to 202/103.   CT spine was concerning for acute L2 fracture.      Assessment/Plan:   Principal Problem:   Intractable back pain Active Problems:   Accidental fall   Acute L2 vertebral fracture (HCC)   Weakness of both lower extremities   Frequent falls   Abnormality of gait due to impairment of balance   Hypertensive urgency   BPH with obstruction/lower urinary tract symptoms   S/P CABG x 2   Coronary artery disease   COPD (chronic obstructive pulmonary disease) (HCC)   Body mass index is 26.01 kg/m.   Intractable low back pain, suspected acute L2 vertebral fracture: MRI lumbar spine showed partially imaged acute/recent bilateral sacral insufficiency fractures, possibly remote L2 fracture.  Suspected L4-L5 disc edema and endplate marrow edema suspicious for degenerative changes, moderate foraminal stenosis bilaterally at L5-S1 and on the left at L4-L5. Pain is better.  Analgesics as needed for pain. PT and OT recommended discharge to SNF. TLSO brace when out of bed He was evaluated by IR.  No surgical intervention required because L2 fracture thought to be a remote fracture. Awaiting placement to SNF.   Hypertensive urgency: Fluctuating BP.   Mostly elevated.  He is not keen on starting another antihypertensive.  He thinks BP is elevated because of multiple activities in the hospital.  He said his his BP is usually controlled at home. Continue enalapril .  No additional antihypertensives for now   CAD with history of CABG: Continue statin   Comorbidities include COPD, BPH.   Diet Order             Diet Heart Fluid consistency: Thin  Diet effective now                                  Consultants: Interventional radiologist  Procedures: None    Medications:    celecoxib   200 mg Oral BID   enalapril   20 mg Oral Daily   lidocaine   1 patch Transdermal Q24H   pantoprazole   40 mg Oral Daily   rosuvastatin   10 mg Oral Daily   senna-docusate  1 tablet Oral BID   Continuous Infusions:   Anti-infectives (From admission, onward)    None              Family Communication/Anticipated D/C date and plan/Code Status   DVT prophylaxis: SCDs Start: 10/18/24 1935     Code Status: Full Code  Family Communication: None Disposition Plan: Plan to discharge to SNF   Status is: Observation The patient will require care spanning > 2 midnights and should be moved to inpatient because:  Acute L2 fracture       Subjective:   Interval events noted.  No complaints.  She feels better.  Objective:    Vitals:   10/23/24 1615 10/23/24 2028 10/24/24 0605 10/24/24 0739  BP: 136/83 100/61 (!) 171/86 (!) 168/84  Pulse: 64 75 76 61  Resp: 17 16 16 16   Temp: 97.8 F (36.6 C)  (!) 97.4 F (36.3 C) 97.7 F (36.5 C)  TempSrc: Oral     SpO2: 97% 95% 98% 96%  Weight:      Height:       No data found.   Intake/Output Summary (Last 24 hours) at 10/24/2024 1532 Last data filed at 10/24/2024 1050 Gross per 24 hour  Intake 240 ml  Output 700 ml  Net -460 ml    Filed Weights   10/18/24 1204 10/18/24 2118  Weight: 80.7 kg 79.9 kg    Exam:   GEN: NAD SKIN: Warm and dry EYES:  Icteric ENT: MMM CV: RRR PULM: CTA B ABD: soft, ND, NT, +BS CNS: AAO x 3, non focal EXT: No edema or tenderness        Data Reviewed:   I have personally reviewed following labs and imaging studies:  Labs: Labs show the following:   Basic Metabolic Panel: Recent Labs  Lab 10/18/24 1208 10/23/24 0431  NA 142 141  K 3.7 4.9  CL 107 106  CO2 24 28  GLUCOSE 128* 91  BUN 20 18  CREATININE 0.89 0.83  CALCIUM  9.8 9.9   GFR Estimated Creatinine Clearance: 63.9 mL/min (by C-G formula based on SCr of 0.83 mg/dL). Liver Function Tests: Recent Labs  Lab 10/18/24 1208  AST 34  ALT 15  ALKPHOS 180*  BILITOT 0.9  PROT 7.2  ALBUMIN 4.3   No results for input(s): LIPASE, AMYLASE in the last 168 hours. No results for input(s): AMMONIA in the last 168 hours. Coagulation profile No results for input(s): INR, PROTIME in the last 168 hours.  CBC: Recent Labs  Lab 10/18/24 1208 10/23/24 0431  WBC 6.9 6.4  HGB 13.7 13.5  HCT 39.5 39.0  MCV 93.2 92.0  PLT 194 185   Cardiac Enzymes: Recent Labs  Lab 10/18/24 1208  CKTOTAL 405*   BNP (last 3 results) No results for input(s): PROBNP in the last 8760 hours. CBG: Recent Labs  Lab 10/18/24 1342  GLUCAP 111*   D-Dimer: No results for input(s): DDIMER in the last 72 hours. Hgb A1c: No results for input(s): HGBA1C in the last 72 hours. Lipid Profile: No results for input(s): CHOL, HDL, LDLCALC, TRIG, CHOLHDL, LDLDIRECT in the last 72 hours. Thyroid function studies: No results for input(s): TSH, T4TOTAL, T3FREE, THYROIDAB in the last 72 hours.  Invalid input(s): FREET3 Anemia work up: No results for input(s): VITAMINB12, FOLATE, FERRITIN, TIBC, IRON, RETICCTPCT in the last 72 hours. Sepsis Labs: Recent Labs  Lab 10/18/24 1208 10/23/24 0431  WBC 6.9 6.4    Microbiology No results found for this or any previous visit (from the past 240  hours).  Procedures and diagnostic studies:  No results found.              LOS: 0 days   Kylei Purington  [facility]   Pager on [email or web address]. If 7PM-7AM, please contact night-coverage at [email or web address]     10/24/2024, 3:32 PM           "

## 2024-10-24 NOTE — Plan of Care (Signed)
   Problem: Activity: Goal: Risk for activity intolerance will decrease Outcome: Progressing   Problem: Nutrition: Goal: Adequate nutrition will be maintained Outcome: Progressing   Problem: Elimination: Goal: Will not experience complications related to bowel motility Outcome: Progressing   Problem: Pain Managment: Goal: General experience of comfort will improve and/or be controlled Outcome: Progressing   Problem: Safety: Goal: Ability to remain free from injury will improve Outcome: Progressing   Problem: Skin Integrity: Goal: Risk for impaired skin integrity will decrease Outcome: Progressing

## 2024-10-24 NOTE — Plan of Care (Signed)

## 2024-10-25 MED ORDER — CITALOPRAM HYDROBROMIDE 20 MG PO TABS
40.0000 mg | ORAL_TABLET | Freq: Every day | ORAL | Status: AC
  Administered 2024-10-25 – 2024-10-27 (×3): 40 mg via ORAL
  Filled 2024-10-25 (×2): qty 2
  Filled 2024-10-25: qty 4
  Filled 2024-10-25: qty 2

## 2024-10-25 NOTE — Progress Notes (Signed)
 " PROGRESS NOTE    James [name]  FMW:[id number] DOB: [birth year] DOA: 10/18/2024 PCP: [name], MD    Brief Narrative:   CAMMERON [name] is a 89 y.o. male  with medical history significant for CAD s/p CABG, COPD, CKD, HTN, ambulatory dysfunction, who presented to the hospital because of low back pain, general weakness and recurrent falls at home.  He is falling about twice in 3 days.  Reportedly, he struck his head once.  Of note, patient had been previously evaluated in the past for tremors, shuffling gait, falls and cognitive dysfunction in October 2024.  Previous MRI brain did not show any acute abnormality.   Vitals in the ED significant for BP up to 202/103.     CT spine was concerning for acute L2 fracture.   Assessment & Plan:   Principal Problem:   Intractable back pain Active Problems:   Accidental fall   Acute L2 vertebral fracture (HCC)   Weakness of both lower extremities   Frequent falls   Abnormality of gait due to impairment of balance   Hypertensive urgency   BPH with obstruction/lower urinary tract symptoms   S/P CABG x 2   Coronary artery disease   COPD (chronic obstructive pulmonary disease) (HCC)   Intractable low back pain, suspected acute L2 vertebral fracture: MRI lumbar spine showed partially imaged acute/recent bilateral sacral insufficiency fractures, possibly remote L2 fracture.  Suspected L4-L5 disc edema and endplate marrow edema suspicious for degenerative changes, moderate foraminal stenosis bilaterally at L5-S1 and on the left at L4-L5. Plan: As needed analgesics Therapy efforts TLSO brace when out of bed No surgical invention warranted Awaiting placement for skilled nursing facility  Hypertensive urgency: Fluctuating BP.  Mostly elevated.  He is not keen on starting another antihypertensive.  He thinks BP is elevated because of multiple activities in the hospital.  He said his his BP is usually controlled at home. Continue  enalapril .  No additional antihypertensives for now Monitor BP closely     CAD with history of CABG: Continue statin     Comorbidities include COPD, BPH.     DVT prophylaxis: SCD Code Status: Full Family Communication: None today Disposition Plan: Awaiting placement to skilled nursing facility Level of care: Med-Surg  Consultants:  None  Procedures:  None  Antimicrobials: None   Subjective: Seen and examined peer resting in bed.  No distress.  No complaints  Objective: Vitals:   10/24/24 1616 10/24/24 2148 10/25/24 0230 10/25/24 0831  BP: 124/72 126/79 135/83 (!) 158/96  Pulse: 60 70 67 63  Resp: 18 18 18 18   Temp: 98.3 F (36.8 C) 98 F (36.7 C) 98.2 F (36.8 C) 97.9 F (36.6 C)  TempSrc:      SpO2: 100% 96% 95% 96%  Weight:      Height:        Intake/Output Summary (Last 24 hours) at 10/25/2024 1542 Last data filed at 10/25/2024 0855 Gross per 24 hour  Intake --  Output 350 ml  Net -350 ml   Filed Weights   10/18/24 1204 10/18/24 2118  Weight: 80.7 kg 79.9 kg    Examination:  General exam: Appears calm and comfortable  Respiratory system: Clear to auscultation. Respiratory effort normal. Cardiovascular system: S1-S2, RRR, no murmurs, no pedal edema Gastrointestinal system: Soft, NT/ND, normal bowel sounds Central nervous system: Alert and oriented. No focal neurological deficits. Extremities: Power bilateral lower extremities.  Gait not assessed Skin: No rashes, lesions or ulcers Psychiatry: Judgement  and insight appear normal. Mood & affect appropriate.     Data Reviewed: I have personally reviewed following labs and imaging studies  CBC: Recent Labs  Lab 10/23/24 0431  WBC 6.4  HGB 13.5  HCT 39.0  MCV 92.0  PLT 185   Basic Metabolic Panel: Recent Labs  Lab 10/23/24 0431  NA 141  K 4.9  CL 106  CO2 28  GLUCOSE 91  BUN 18  CREATININE 0.83  CALCIUM  9.9   GFR: Estimated Creatinine Clearance: 63.9 mL/min (by C-G formula  based on SCr of 0.83 mg/dL). Liver Function Tests: No results for input(s): AST, ALT, ALKPHOS, BILITOT, PROT, ALBUMIN in the last 168 hours. No results for input(s): LIPASE, AMYLASE in the last 168 hours. No results for input(s): AMMONIA in the last 168 hours. Coagulation Profile: No results for input(s): INR, PROTIME in the last 168 hours. Cardiac Enzymes: No results for input(s): CKTOTAL, CKMB, CKMBINDEX, TROPONINI in the last 168 hours. BNP (last 3 results) No results for input(s): PROBNP in the last 8760 hours. HbA1C: No results for input(s): HGBA1C in the last 72 hours. CBG: No results for input(s): GLUCAP in the last 168 hours. Lipid Profile: No results for input(s): CHOL, HDL, LDLCALC, TRIG, CHOLHDL, LDLDIRECT in the last 72 hours. Thyroid Function Tests: No results for input(s): TSH, T4TOTAL, FREET4, T3FREE, THYROIDAB in the last 72 hours. Anemia Panel: No results for input(s): VITAMINB12, FOLATE, FERRITIN, TIBC, IRON, RETICCTPCT in the last 72 hours. Sepsis Labs: No results for input(s): PROCALCITON, LATICACIDVEN in the last 168 hours.  No results found for this or any previous visit (from the past 240 hours).       Radiology Studies: No results found.      Scheduled Meds:  celecoxib   200 mg Oral BID   citalopram   40 mg Oral Daily   enalapril   20 mg Oral Daily   lidocaine   1 patch Transdermal Q24H   pantoprazole   40 mg Oral Daily   rosuvastatin   10 mg Oral Daily   senna-docusate  1 tablet Oral BID   Continuous Infusions:   LOS: 0 days     Calvin KATHEE Robson, MD [facility]   If 7PM-7AM, please contact night-coverage  10/25/2024, 3:42 PM   "

## 2024-10-25 NOTE — Plan of Care (Signed)

## 2024-10-25 NOTE — Plan of Care (Signed)

## 2024-10-26 MED ORDER — TRAZODONE HCL 50 MG PO TABS: 50.0000 mg | ORAL_TABLET | Freq: Every evening | ORAL | Status: AC | PRN

## 2024-10-26 NOTE — TOC Progression Note (Signed)
 Transition of Care [facility]) - Progression Note    Patient Details  Name: James [name] MRN: [id number] Date of Birth: December 21, 1935  [facility] CM/SW Contact  Nathanael CHRISTELLA Ring, RN Phone Number: 10/26/2024, 3:51 PM  Clinical Narrative:    Per [place] at [facility] auth still pending.    Expected Discharge Plan: Skilled Nursing Facility Barriers to Discharge: Continued Medical Work up               Expected Discharge Plan and Services In-house Referral: Clinical Social Work     Living arrangements for the past 2 months: Single Family Home Expected Discharge Date: 10/22/24                                     Social Drivers of Health (SDOH) Interventions SDOH Screenings   Food Insecurity: No Food Insecurity (10/18/2024)  Housing: Low Risk (10/18/2024)  Recent Concern: Housing - High Risk (07/21/2024)   Received from [facility]  Transportation Needs: No Transportation Needs (10/18/2024)  Utilities: Not At Risk (10/18/2024)  Alcohol Screen: Low Risk (08/16/2023)  Depression (PHQ2-9): Medium Risk (08/16/2023)  Financial Resource Strain: Low Risk  (08/22/2024)   Received from [facility]  Physical Activity: Sufficiently Active (08/16/2023)  Social Connections: Unknown (10/18/2024)  Tobacco Use: Medium Risk (10/18/2024)  Health Literacy: Adequate Health Literacy (08/16/2023)    Readmission Risk Interventions     No data to display

## 2024-10-26 NOTE — Progress Notes (Signed)
 Physical Therapy Treatment Patient Details Name: James [name] MRN: [id number] DOB: 08-03-1936 Today's Date: 10/26/2024   History of Present Illness Pt is an 89 y/o M admitted on 10/18/24 after presenting with c/o falls. Pt found to have acute L2 vertebral fx. PMH: CAD s/p CABG, COPD, CKD, HTN, ambulatory dysfunction, anxiety    PT Comments  Pt continues to make functional progress. Gait more fluent with B heel strike, still reliant on RW for support, however only requires CGA/S. No numbness or buckling during mobility. Pt does struggle a bit still with sit<>stand transfers requiring Mod/MinA. Will continue to focus on acute goals. Awaiting STR placement    If plan is discharge home, recommend the following: A little help with walking and/or transfers;A little help with bathing/dressing/bathroom;Assistance with cooking/housework;Assist for transportation;Help with stairs or ramp for entrance   Can travel by private vehicle     Yes  Equipment Recommendations  BSC/3in1    Recommendations for Other Services       Precautions / Restrictions Precautions Precautions: Fall;Back Precaution Booklet Issued: Yes (comment) Recall of Precautions/Restrictions: Impaired Required Braces or Orthoses: Spinal Brace Spinal Brace: Thoracolumbosacral orthotic;Applied in sitting position Restrictions Weight Bearing Restrictions Per Provider Order: No     Mobility  Bed Mobility               General bed mobility comments:  (Pt in chair pre/post session)    Transfers Overall transfer level: Needs assistance Equipment used: Rolling walker (2 wheels) Transfers: Sit to/from Stand Sit to Stand: Mod assist, Min assist           General transfer comment: vc for anterior weight shift during ascent. ModA to raise from [employer] with assist for hygiene    Ambulation/Gait Ambulation/Gait assistance: Contact guard assist Gait Distance (Feet):  (160) Assistive device: Rolling walker (2  wheels) Gait Pattern/deviations: Step-through pattern, Decreased stride length Gait velocity: decreased     General Gait Details:  (Improved heel strike and fluent gait pattern)   Stairs             Wheelchair Mobility     Tilt Bed    Modified Rankin (Stroke Patients Only)       Balance Overall balance assessment: Needs assistance, History of Falls Sitting-balance support: Feet supported Sitting balance-Leahy Scale: Good     Standing balance support: During functional activity, Bilateral upper extremity supported, Reliant on assistive device for balance Standing balance-Leahy Scale: Fair Standing balance comment: relies on RW for support                            Communication Communication Communication: No apparent difficulties  Cognition Arousal: Alert Behavior During Therapy: WFL for tasks assessed/performed   PT - Cognitive impairments: No apparent impairments                       PT - Cognition Comments: Very pleasant and cooperative Following commands: Intact      Cueing Cueing Techniques: Verbal cues, Visual cues  Exercises      General Comments General comments (skin integrity, edema, etc.):  (Pure wick removed, pt's scrotum red and irritated, cream applied and nursing notified)      Pertinent Vitals/Pain Pain Assessment Pain Assessment: Faces Faces Pain Scale: Hurts a little bit Pain Location: back Pain Descriptors / Indicators: Discomfort Pain Intervention(s): Monitored during session    Home Living  Prior Function            PT Goals (current goals can now be found in the care plan section) Acute Rehab PT Goals Patient Stated Goal: decreased pain, go home Progress towards PT goals: Progressing toward goals    Frequency    7X/week      PT Plan      Co-evaluation              AM-PAC PT 6 Clicks Mobility   Outcome Measure  Help needed turning from your  back to your side while in a flat bed without using bedrails?: None Help needed moving from lying on your back to sitting on the side of a flat bed without using bedrails?: A Little Help needed moving to and from a bed to a chair (including a wheelchair)?: A Little Help needed standing up from a chair using your arms (e.g., wheelchair or bedside chair)?: A Little Help needed to walk in hospital room?: A Little Help needed climbing 3-5 steps with a railing? : A Lot 6 Click Score: 18    End of Session Equipment Utilized During Treatment: Gait belt;Back brace Activity Tolerance: Patient tolerated treatment well Patient left: in chair;with call bell/phone within reach;with chair alarm set Nurse Communication: Mobility status PT Visit Diagnosis: History of falling (Z91.81);Other abnormalities of gait and mobility (R26.89);Difficulty in walking, not elsewhere classified (R26.2);Muscle weakness (generalized) (M62.81);Pain Pain - part of body:  (back)     Time: 1040-1107 PT Time Calculation (min) (ACUTE ONLY): 27 min  Charges:    $Gait Training: 8-22 mins $Therapeutic Activity: 8-22 mins PT General Charges $$ ACUTE PT VISIT: 1 Visit                    James [name], PTA  James [name] 10/26/2024, 1:07 PM

## 2024-10-26 NOTE — Progress Notes (Signed)
 " PROGRESS NOTE    James [name]  FMW:[id number] DOB: 10-18-1935 DOA: 10/18/2024 PCP: [name], MD    Brief Narrative:   James [name] is a 89 y.o. male  with medical history significant for CAD s/p CABG, COPD, CKD, HTN, ambulatory dysfunction, who presented to the hospital because of low back pain, general weakness and recurrent falls at home.  He is falling about twice in 3 days.  Reportedly, he struck his head once.  Of note, patient had been previously evaluated in the past for tremors, shuffling gait, falls and cognitive dysfunction in October 2024.  Previous MRI brain did not show any acute abnormality.   Vitals in the ED significant for BP up to 202/103.     CT spine was concerning for acute L2 fracture.   Assessment & Plan:   Principal Problem:   Intractable back pain Active Problems:   Accidental fall   Acute L2 vertebral fracture (HCC)   Weakness of both lower extremities   Frequent falls   Abnormality of gait due to impairment of balance   Hypertensive urgency   BPH with obstruction/lower urinary tract symptoms   S/P CABG x 2   Coronary artery disease   COPD (chronic obstructive pulmonary disease) (HCC)   Intractable low back pain, suspected acute L2 vertebral fracture: MRI lumbar spine showed partially imaged acute/recent bilateral sacral insufficiency fractures, possibly remote L2 fracture.  Suspected L4-L5 disc edema and endplate marrow edema suspicious for degenerative changes, moderate foraminal stenosis bilaterally at L5-S1 and on the left at L4-L5. Plan: As needed analgesics Therapy efforts TLSO brace when out of bed No surgical invention warranted Awaiting placement for skilled nursing facility Insurance authorization pending  Hypertensive urgency: Fluctuating BP.  Mostly elevated.  He is not keen on starting another antihypertensive.  He thinks BP is elevated because of multiple activities in the hospital.  He said his his BP is usually  controlled at home. Continue enalapril .  No additional antihypertensives for now Monitor BP closely     CAD with history of CABG: Continue statin     Comorbidities include COPD, BPH.     DVT prophylaxis: SCD Code Status: Full Family Communication: None today Disposition Plan: Awaiting placement to skilled nursing facility Level of care: Med-Surg  Consultants:  None  Procedures:  None  Antimicrobials: None   Subjective: Seen and examined.  Resting in bed.  No distress.  Objective: Vitals:   10/25/24 0831 10/25/24 2139 10/26/24 0317 10/26/24 0827  BP: (!) 158/96 132/76 (!) 161/81 (!) 177/87  Pulse: 63 70 62 (!) 57  Resp: 18 17 16 18   Temp: 97.9 F (36.6 C) 98.1 F (36.7 C) (!) 97.5 F (36.4 C) (!) 97.5 F (36.4 C)  TempSrc:  Oral Oral Oral  SpO2: 96% 97% 96% 97%  Weight:      Height:        Intake/Output Summary (Last 24 hours) at 10/26/2024 1310 Last data filed at 10/26/2024 0929 Gross per 24 hour  Intake 240 ml  Output --  Net 240 ml   Filed Weights   10/18/24 1204 10/18/24 2118  Weight: 80.7 kg 79.9 kg    Examination:  General exam: NAD Respiratory system: Clear to auscultation. Respiratory effort normal. Cardiovascular system: S1-S2, RRR, no murmurs, no pedal edema Gastrointestinal system: Soft, NT/ND, normal bowel sounds Central nervous system: Alert and oriented. No focal neurological deficits. Extremities: Power bilateral lower extremities.  Gait not assessed Skin: No rashes, lesions or ulcers Psychiatry:  Judgement and insight appear normal. Mood & affect appropriate.     Data Reviewed: I have personally reviewed following labs and imaging studies  CBC: Recent Labs  Lab 10/23/24 0431  WBC 6.4  HGB 13.5  HCT 39.0  MCV 92.0  PLT 185   Basic Metabolic Panel: Recent Labs  Lab 10/23/24 0431  NA 141  K 4.9  CL 106  CO2 28  GLUCOSE 91  BUN 18  CREATININE 0.83  CALCIUM  9.9   GFR: Estimated Creatinine Clearance: 63.9 mL/min  (by C-G formula based on SCr of 0.83 mg/dL). Liver Function Tests: No results for input(s): AST, ALT, ALKPHOS, BILITOT, PROT, ALBUMIN in the last 168 hours. No results for input(s): LIPASE, AMYLASE in the last 168 hours. No results for input(s): AMMONIA in the last 168 hours. Coagulation Profile: No results for input(s): INR, PROTIME in the last 168 hours. Cardiac Enzymes: No results for input(s): CKTOTAL, CKMB, CKMBINDEX, TROPONINI in the last 168 hours. BNP (last 3 results) No results for input(s): PROBNP in the last 8760 hours. HbA1C: No results for input(s): HGBA1C in the last 72 hours. CBG: No results for input(s): GLUCAP in the last 168 hours. Lipid Profile: No results for input(s): CHOL, HDL, LDLCALC, TRIG, CHOLHDL, LDLDIRECT in the last 72 hours. Thyroid Function Tests: No results for input(s): TSH, T4TOTAL, FREET4, T3FREE, THYROIDAB in the last 72 hours. Anemia Panel: No results for input(s): VITAMINB12, FOLATE, FERRITIN, TIBC, IRON, RETICCTPCT in the last 72 hours. Sepsis Labs: No results for input(s): PROCALCITON, LATICACIDVEN in the last 168 hours.  No results found for this or any previous visit (from the past 240 hours).       Radiology Studies: No results found.      Scheduled Meds:  celecoxib   200 mg Oral BID   citalopram   40 mg Oral Daily   enalapril   20 mg Oral Daily   lidocaine   1 patch Transdermal Q24H   pantoprazole   40 mg Oral Daily   rosuvastatin   10 mg Oral Daily   senna-docusate  1 tablet Oral BID   Continuous Infusions:   LOS: 0 days     Calvin KATHEE Robson, MD [facility]   If 7PM-7AM, please contact night-coverage  10/26/2024, 1:10 PM   "

## 2024-10-27 LAB — BASIC METABOLIC PANEL WITH GFR
Anion gap: 10 (ref 5–15)
BUN: 29 mg/dL — ABNORMAL HIGH (ref 8–23)
CO2: 25 mmol/L (ref 22–32)
Calcium: 9.4 mg/dL (ref 8.9–10.3)
Chloride: 104 mmol/L (ref 98–111)
Creatinine, Ser: 0.93 mg/dL (ref 0.61–1.24)
GFR, Estimated: >60 mL/min
Glucose, Bld: 117 mg/dL — ABNORMAL HIGH (ref 70–99)
Potassium: 4 mmol/L (ref 3.5–5.1)
Sodium: 138 mmol/L (ref 135–145)

## 2024-10-27 LAB — CBC WITH DIFFERENTIAL/PLATELET
Abs Immature Granulocytes: 0.03 10*3/uL (ref 0.00–0.07)
Basophils Absolute: 0.1 10*3/uL (ref 0.0–0.1)
Basophils Relative: 1 %
Eosinophils Absolute: 0.2 10*3/uL (ref 0.0–0.5)
Eosinophils Relative: 4 %
HCT: 38 % — ABNORMAL LOW (ref 39.0–52.0)
Hemoglobin: 13.2 g/dL (ref 13.0–17.0)
Immature Granulocytes: 1 %
Lymphocytes Relative: 22 %
Lymphs Abs: 1.2 10*3/uL (ref 0.7–4.0)
MCH: 32.8 pg (ref 26.0–34.0)
MCHC: 34.7 g/dL (ref 30.0–36.0)
MCV: 94.3 fL (ref 80.0–100.0)
Monocytes Absolute: 0.5 10*3/uL (ref 0.1–1.0)
Monocytes Relative: 8 %
Neutro Abs: 3.5 10*3/uL (ref 1.7–7.7)
Neutrophils Relative %: 64 %
Platelets: 207 10*3/uL (ref 150–400)
RBC: 4.03 MIL/uL — ABNORMAL LOW (ref 4.22–5.81)
RDW: 12.6 % (ref 11.5–15.5)
WBC: 5.5 10*3/uL (ref 4.0–10.5)
nRBC: 0 % (ref 0.0–0.2)

## 2024-10-27 NOTE — Plan of Care (Signed)
   Problem: Education: Goal: Knowledge of General Education information will improve Description Including pain rating scale, medication(s)/side effects and non-pharmacologic comfort measures Outcome: Progressing

## 2024-10-27 NOTE — Progress Notes (Signed)
 Occupational Therapy Treatment Patient Details Name: James [name] MRN: [id number] DOB: [birth year] Today's Date: 10/27/2024   History of present illness Pt is an 89 y/o M admitted on 10/18/24 after presenting with c/o falls. Pt found to have acute L2 vertebral fx. PMH: CAD s/p CABG, COPD, CKD, HTN, ambulatory dysfunction, anxiety   OT comments  Patient seen for OT treatment on this date. Upon arrival to room patient sitting in recliner, agreeable to treatment. OT educated patient on spinal precautions, patient unable to recall any of 3 spinal precautions, patient was receptive to education. OT obtained AE to demonstrate how to manage LB self care tasks upon discharge in order to maintain independence while adhering to spinal precautions; AE included reacher and sock aid. Patient was able to remove socks using reacher and observed OT demonstrate sock aid; patient was able to return demo with min A and increased time. He is able to momentarily sit in figure 4 but reports discomfort and would like to get more comfortable with using AE. Labs present at this time so session ended prematurely.  Patient ended treatment in recliner with bed/chair alarm on and all needs within reach. Patient making good progress toward goals, will continue to follow POC. Discharge recommendation remains appropriate.        If plan is discharge home, recommend the following:  A little help with walking and/or transfers;A lot of help with bathing/dressing/bathroom   Equipment Recommendations  BSC/3in1    Recommendations for Other Services      Precautions / Restrictions Precautions Precautions: Fall;Back Precaution Booklet Issued: Yes (comment) Recall of Precautions/Restrictions: Impaired Required Braces or Orthoses: Spinal Brace Spinal Brace: Thoracolumbosacral orthotic;Applied in sitting position Restrictions Weight Bearing Restrictions Per Provider Order: No       Mobility Bed Mobility                General bed mobility comments: OOB pre/post tx    Transfers                   General transfer comment: deferred     Balance Overall balance assessment: Needs assistance, History of Falls Sitting-balance support: Feet supported Sitting balance-Leahy Scale: Good                                     ADL either performed or assessed with clinical judgement   ADL Overall ADL's : Needs assistance/impaired                     Lower Body Dressing: Moderate assistance;Sit to/from stand Lower Body Dressing Details (indicate cue type and reason): instructed patient on spinal precautions in relation to ADL performance, patient required increased education. OT introduced sock aid and reacher in order to improve compliance, patient with fair return demo                    Extremity/Trunk Assessment Upper Extremity Assessment Upper Extremity Assessment: Overall WFL for tasks assessed   Lower Extremity Assessment Lower Extremity Assessment: Defer to PT evaluation        Vision       Perception     Praxis     Communication Communication Communication: No apparent difficulties   Cognition Arousal: Alert Behavior During Therapy: [facility] for tasks assessed/performed  Following commands: Intact        Cueing   Cueing Techniques: Verbal cues, Visual cues  Exercises      Shoulder Instructions       General Comments  (Full assist to don TLSO)    Pertinent Vitals/ Pain       Pain Assessment Pain Assessment: No/denies pain  Home Living                                          Prior Functioning/Environment              Frequency  Min 2X/week        Progress Toward Goals  OT Goals(current goals can now be found in the care plan section)  Progress towards OT goals: Progressing toward goals  Acute Rehab OT Goals Patient Stated Goal: to be independent OT Goal  Formulation: With patient/family Time For Goal Achievement: 11/02/24 Potential to Achieve Goals: Good ADL Goals Pt Will Perform Grooming: sitting;with set-up;with supervision Pt Will Perform Lower Body Dressing: sit to/from stand;with supervision;with set-up;with adaptive equipment Pt Will Transfer to Toilet: bedside commode;with set-up;with supervision Pt Will Perform Toileting - Clothing Manipulation and hygiene: with adaptive equipment;with supervision;with set-up;sit to/from stand  Plan      Co-evaluation                 AM-PAC OT 6 Clicks Daily Activity     Outcome Measure   Help from another person eating meals?: None Help from another person taking care of personal grooming?: A Little Help from another person toileting, which includes using toliet, bedpan, or urinal?: A Little Help from another person bathing (including washing, rinsing, drying)?: A Little Help from another person to put on and taking off regular upper body clothing?: A Little Help from another person to put on and taking off regular lower body clothing?: A Little 6 Click Score: 19    End of Session    OT Visit Diagnosis: Other abnormalities of gait and mobility (R26.89);Muscle weakness (generalized) (M62.81);Pain   Activity Tolerance Patient tolerated treatment well   Patient Left in chair;with call bell/phone within reach;with nursing/sitter in room   Nurse Communication Mobility status        Time: 1420-1441 OT Time Calculation (min): 21 min  Charges: OT General Charges $OT Visit: 1 Visit OT Treatments $Self Care/Home Management : 8-22 mins  Rogers Clause, OT/L MSOT, 10/27/2024

## 2024-10-27 NOTE — Progress Notes (Signed)
 Physical Therapy Treatment Patient Details Name: James [name] MRN: [id number] DOB: Jan 19, 1936 Today's Date: 10/27/2024   History of Present Illness Pt is an 89 y/o M admitted on 10/18/24 after presenting with c/o falls. Pt found to have acute L2 vertebral fx. PMH: CAD s/p CABG, COPD, CKD, HTN, ambulatory dysfunction, anxiety    PT Comments  Pt received in bed, educated on log roll technique. Full assist to don TLSO sitting EOB. Reviewed B LE strengthening program. ModA for sit>stand transfers due to B LE weakness, pt educated on proper hand placement and technique. Began gait training with daughter present, pt c/o progressive dizziness and had to return to room. Notified Nursing/MD, Orthostatic vitals and labs ordered.    If plan is discharge home, recommend the following: A little help with walking and/or transfers;A little help with bathing/dressing/bathroom;Assistance with cooking/housework;Assist for transportation;Help with stairs or ramp for entrance   Can travel by private vehicle     Yes  Equipment Recommendations  BSC/3in1    Recommendations for Other Services       Precautions / Restrictions Precautions Precautions: Fall;Back Precaution Booklet Issued: Yes (comment) Recall of Precautions/Restrictions: Impaired Required Braces or Orthoses: Spinal Brace Spinal Brace: Thoracolumbosacral orthotic;Applied in sitting position Restrictions Weight Bearing Restrictions Per Provider Order: No Other Position/Activity Restrictions:  (Pt educated on log rolling)     Mobility  Bed Mobility Overal bed mobility: Needs Assistance Bed Mobility: Supine to Sit     Supine to sit: Mod assist, Min assist, Used rails     General bed mobility comments: cues for log rolling and to assist wiht LE's back onto the bed    Transfers Overall transfer level: Needs assistance Equipment used: Rolling walker (2 wheels) Transfers: Sit to/from Stand Sit to Stand: Mod assist, Min assist            General transfer comment: vc for anterior weight shift during ascent. ModA to raise from [facility] with assist for hygiene    Ambulation/Gait Ambulation/Gait assistance: Contact guard assist Gait Distance (Feet):  (45) Assistive device: Rolling walker (2 wheels) Gait Pattern/deviations: Step-through pattern, Decreased stride length Gait velocity: decreased     General Gait Details:  (Slight unsteadiness due to c/o dizziness while ambulating and had to return to room. Will have nursing assess BP if able)   Stairs             Wheelchair Mobility     Tilt Bed    Modified Rankin (Stroke Patients Only)       Balance Overall balance assessment: Needs assistance, History of Falls Sitting-balance support: Feet supported Sitting balance-Leahy Scale: Good     Standing balance support: During functional activity, Bilateral upper extremity supported, Reliant on assistive device for balance Standing balance-Leahy Scale: Fair Standing balance comment: relies on RW for support                            Communication Communication Communication: No apparent difficulties  Cognition Arousal: Alert Behavior During Therapy: WFL for tasks assessed/performed   PT - Cognitive impairments: No apparent impairments                       PT - Cognition Comments: Very pleasant and cooperative Following commands: Intact      Cueing Cueing Techniques: Verbal cues, Visual cues  Exercises      General Comments General comments (skin integrity, edema, etc.):  (Full assist to don TLSO)  Pertinent Vitals/Pain Pain Assessment Pain Assessment: No/denies pain    Home Living                          Prior Function            PT Goals (current goals can now be found in the care plan section) Acute Rehab PT Goals Patient Stated Goal: decreased pain, go home    Frequency    7X/week      PT Plan      Co-evaluation               AM-PAC PT 6 Clicks Mobility   Outcome Measure  Help needed turning from your back to your side while in a flat bed without using bedrails?: None Help needed moving from lying on your back to sitting on the side of a flat bed without using bedrails?: A Little Help needed moving to and from a bed to a chair (including a wheelchair)?: A Little Help needed standing up from a chair using your arms (e.g., wheelchair or bedside chair)?: A Little Help needed to walk in hospital room?: A Little Help needed climbing 3-5 steps with a railing? : A Lot 6 Click Score: 18    End of Session Equipment Utilized During Treatment: Gait belt;Back brace Activity Tolerance: Treatment limited secondary to medical complications (Comment) (Dizziness in standing) Patient left: in chair;with call bell/phone within reach;with chair alarm set;with family/visitor present Nurse Communication: Mobility status;Other (comment) (Dizziness in standing) PT Visit Diagnosis: History of falling (Z91.81);Other abnormalities of gait and mobility (R26.89);Difficulty in walking, not elsewhere classified (R26.2);Muscle weakness (generalized) (M62.81);Pain Pain - part of body:  (back)     Time: 1020-1058 PT Time Calculation (min) (ACUTE ONLY): 38 min  Charges:    $Gait Training: 8-22 mins $Therapeutic Exercise: 8-22 mins $Therapeutic Activity: 8-22 mins PT General Charges $$ ACUTE PT VISIT: 1 Visit                    Darice Bohr, PTA  Darice JAYSON Bohr 10/27/2024, 1:41 PM

## 2024-10-27 NOTE — Plan of Care (Signed)
" °  Problem: Education: Goal: Knowledge of General Education information will improve Description: Including pain rating scale, medication(s)/side effects and non-pharmacologic comfort measures Outcome: Progressing   Problem: Pain Managment: Goal: General experience of comfort will improve and/or be controlled Outcome: Progressing   Problem: Clinical Measurements: Goal: Will remain free from infection Outcome: Adequate for Discharge Goal: Cardiovascular complication will be avoided Outcome: Adequate for Discharge   Problem: Nutrition: Goal: Adequate nutrition will be maintained Outcome: Adequate for Discharge   Problem: Coping: Goal: Level of anxiety will decrease Outcome: Adequate for Discharge   "

## 2024-10-27 NOTE — Progress Notes (Signed)
 " PROGRESS NOTE    James [name]  FMW:[id number] DOB: July 07, 1936 DOA: 10/18/2024 PCP: [name], MD    Brief Narrative:   James [name] is a 89 y.o. male  with medical history significant for CAD s/p CABG, COPD, CKD, HTN, ambulatory dysfunction, who presented to the hospital because of low back pain, general weakness and recurrent falls at home.  He is falling about twice in 3 days.  Reportedly, he struck his head once.  Of note, patient had been previously evaluated in the past for tremors, shuffling gait, falls and cognitive dysfunction in October 2024.  Previous MRI brain did not show any acute abnormality.   Vitals in the ED significant for BP up to 202/103.   CT spine was concerning for acute L2 fracture.   Assessment & Plan:   Principal Problem:   Intractable back pain Active Problems:   Accidental fall   Acute L2 vertebral fracture (HCC)   Weakness of both lower extremities   Frequent falls   Abnormality of gait due to impairment of balance   Hypertensive urgency   BPH with obstruction/lower urinary tract symptoms   S/P CABG x 2   Coronary artery disease   COPD (chronic obstructive pulmonary disease) (HCC)   Intractable low back pain, suspected acute L2 vertebral fracture: MRI lumbar spine showed partially imaged acute/recent bilateral sacral insufficiency fractures, possibly remote L2 fracture.  Suspected L4-L5 disc edema and endplate marrow edema suspicious for degenerative changes, moderate foraminal stenosis bilaterally at L5-S1 and on the left at L4-L5. Plan: Continue as needed analgesics Therapy efforts TLSO brace when out of bed No surgical invention warranted Awaiting placement for skilled nursing facility Insurance authorization pending  Hypertensive urgency: Fluctuating BP.  Mostly elevated.  He is not keen on starting another antihypertensive.  He thinks BP is elevated because of multiple activities in the hospital.  He said his his BP is  usually controlled at home. Continue enalapril .  No additional antihypertensives for now Monitor BP closely Can check orthostatics     CAD with history of CABG: Continue statin     Comorbidities include COPD, BPH.     DVT prophylaxis: SCD Code Status: Full Family Communication: None today Disposition Plan: Awaiting placement to skilled nursing facility Level of care: Med-Surg  Consultants:  None  Procedures:  None  Antimicrobials: None   Subjective: Seen and examined.  Resting in bed.  No distress.  Objective: Vitals:   10/27/24 0424 10/27/24 0831 10/27/24 0833 10/27/24 1020  BP: (!) 178/84 (!) 192/96 (!) 179/78 (!) 152/78  Pulse: (!) 57 63 (!) 55   Resp:  19    Temp:  97.6 F (36.4 C)    TempSrc:      SpO2:  97% 96%   Weight:      Height:        Intake/Output Summary (Last 24 hours) at 10/27/2024 1351 Last data filed at 10/27/2024 1032 Gross per 24 hour  Intake 360 ml  Output 300 ml  Net 60 ml   Filed Weights   10/18/24 1204 10/18/24 2118  Weight: 80.7 kg 79.9 kg    Examination:  General exam: No acute distress Respiratory system: Clear to auscultation. Respiratory effort normal. Cardiovascular system: S1-S2, RRR, no murmurs, no pedal edema Gastrointestinal system: Soft, NT/ND, normal bowel sounds Central nervous system: Alert and oriented. No focal neurological deficits. Extremities: Power bilateral lower extremities.  Gait not assessed Skin: No rashes, lesions or ulcers Psychiatry: Judgement and insight appear  normal. Mood & affect appropriate.     Data Reviewed: I have personally reviewed following labs and imaging studies  CBC: Recent Labs  Lab 10/23/24 0431  WBC 6.4  HGB 13.5  HCT 39.0  MCV 92.0  PLT 185   Basic Metabolic Panel: Recent Labs  Lab 10/23/24 0431  NA 141  K 4.9  CL 106  CO2 28  GLUCOSE 91  BUN 18  CREATININE 0.83  CALCIUM  9.9   GFR: Estimated Creatinine Clearance: 63.9 mL/min (by C-G formula based on SCr  of 0.83 mg/dL). Liver Function Tests: No results for input(s): AST, ALT, ALKPHOS, BILITOT, PROT, ALBUMIN in the last 168 hours. No results for input(s): LIPASE, AMYLASE in the last 168 hours. No results for input(s): AMMONIA in the last 168 hours. Coagulation Profile: No results for input(s): INR, PROTIME in the last 168 hours. Cardiac Enzymes: No results for input(s): CKTOTAL, CKMB, CKMBINDEX, TROPONINI in the last 168 hours. BNP (last 3 results) No results for input(s): PROBNP in the last 8760 hours. HbA1C: No results for input(s): HGBA1C in the last 72 hours. CBG: No results for input(s): GLUCAP in the last 168 hours. Lipid Profile: No results for input(s): CHOL, HDL, LDLCALC, TRIG, CHOLHDL, LDLDIRECT in the last 72 hours. Thyroid Function Tests: No results for input(s): TSH, T4TOTAL, FREET4, T3FREE, THYROIDAB in the last 72 hours. Anemia Panel: No results for input(s): VITAMINB12, FOLATE, FERRITIN, TIBC, IRON, RETICCTPCT in the last 72 hours. Sepsis Labs: No results for input(s): PROCALCITON, LATICACIDVEN in the last 168 hours.  No results found for this or any previous visit (from the past 240 hours).       Radiology Studies: No results found.      Scheduled Meds:  celecoxib   200 mg Oral BID   citalopram   40 mg Oral Daily   enalapril   20 mg Oral Daily   lidocaine   1 patch Transdermal Q24H   pantoprazole   40 mg Oral Daily   rosuvastatin   10 mg Oral Daily   senna-docusate  1 tablet Oral BID   Continuous Infusions:   LOS: 0 days     James KATHEE Robson, MD [facility]   If 7PM-7AM, please contact night-coverage  10/27/2024, 1:51 PM   "

## 2025-01-08 ENCOUNTER — Ambulatory Visit: Payer: Medicare (Managed Care) | Admitting: Internal Medicine

## 2025-04-04 ENCOUNTER — Ambulatory Visit: Admitting: Dermatology
